# Patient Record
Sex: Female | Born: 1935 | Race: White | Hispanic: No | Smoking: Former smoker
Health system: Southern US, Community
[De-identification: ages and names within clinical notes are randomized; demographics above are authoritative.]

## PROBLEM LIST (undated history)

## (undated) DIAGNOSIS — C4490 Unspecified malignant neoplasm of skin, unspecified: Secondary | ICD-10-CM

## (undated) DIAGNOSIS — E785 Hyperlipidemia, unspecified: Secondary | ICD-10-CM

## (undated) DIAGNOSIS — I10 Essential (primary) hypertension: Secondary | ICD-10-CM

## (undated) DIAGNOSIS — M8580 Other specified disorders of bone density and structure, unspecified site: Secondary | ICD-10-CM

## (undated) DIAGNOSIS — K5730 Diverticulosis of large intestine without perforation or abscess without bleeding: Secondary | ICD-10-CM

## (undated) DIAGNOSIS — I7300 Raynaud's syndrome without gangrene: Secondary | ICD-10-CM

## (undated) DIAGNOSIS — W5581XA Bitten by other mammals, initial encounter: Secondary | ICD-10-CM

## (undated) DIAGNOSIS — M419 Scoliosis, unspecified: Secondary | ICD-10-CM

## (undated) DIAGNOSIS — S82899A Other fracture of unspecified lower leg, initial encounter for closed fracture: Secondary | ICD-10-CM

## (undated) DIAGNOSIS — E079 Disorder of thyroid, unspecified: Secondary | ICD-10-CM

## (undated) HISTORY — DX: Scoliosis, unspecified: M41.9

## (undated) HISTORY — DX: Essential (primary) hypertension: I10

## (undated) HISTORY — DX: Raynaud's syndrome without gangrene: I73.00

## (undated) HISTORY — PX: CATARACT EXTRACTION: SUR2

## (undated) HISTORY — DX: Hyperlipidemia, unspecified: E78.5

## (undated) HISTORY — PX: OTHER SURGICAL HISTORY: SHX169

## (undated) HISTORY — DX: Diverticulosis of large intestine without perforation or abscess without bleeding: K57.30

## (undated) HISTORY — DX: Unspecified malignant neoplasm of skin, unspecified: C44.90

## (undated) HISTORY — DX: Other fracture of unspecified lower leg, initial encounter for closed fracture: S82.899A

## (undated) HISTORY — DX: Disorder of thyroid, unspecified: E07.9

## (undated) HISTORY — DX: Other specified disorders of bone density and structure, unspecified site: M85.80

## (undated) HISTORY — PX: TONSILLECTOMY: SUR1361

## (undated) HISTORY — PX: DILATION AND CURETTAGE OF UTERUS: SHX78

## (undated) HISTORY — PX: HYSTEROSCOPY: SHX211

## (undated) HISTORY — DX: Bitten by other mammals, initial encounter: W55.81XA

---

## 1997-10-14 ENCOUNTER — Other Ambulatory Visit: Admission: RE | Admit: 1997-10-14 | Discharge: 1997-10-14 | Payer: Self-pay | Admitting: *Deleted

## 1998-02-24 ENCOUNTER — Other Ambulatory Visit: Admission: RE | Admit: 1998-02-24 | Discharge: 1998-02-24 | Payer: Self-pay | Admitting: *Deleted

## 1999-02-23 ENCOUNTER — Other Ambulatory Visit: Admission: RE | Admit: 1999-02-23 | Discharge: 1999-02-23 | Payer: Self-pay | Admitting: *Deleted

## 2000-03-07 ENCOUNTER — Other Ambulatory Visit: Admission: RE | Admit: 2000-03-07 | Discharge: 2000-03-07 | Payer: Self-pay | Admitting: *Deleted

## 2001-05-23 ENCOUNTER — Other Ambulatory Visit: Admission: RE | Admit: 2001-05-23 | Discharge: 2001-05-23 | Payer: Self-pay | Admitting: *Deleted

## 2002-09-06 HISTORY — PX: I & D EXTREMITY: SHX5045

## 2003-01-23 ENCOUNTER — Ambulatory Visit (HOSPITAL_BASED_OUTPATIENT_CLINIC_OR_DEPARTMENT_OTHER): Admission: RE | Admit: 2003-01-23 | Discharge: 2003-01-23 | Payer: Self-pay | Admitting: Orthopedic Surgery

## 2003-05-06 ENCOUNTER — Ambulatory Visit (HOSPITAL_COMMUNITY): Admission: RE | Admit: 2003-05-06 | Discharge: 2003-05-06 | Payer: Self-pay | Admitting: Obstetrics and Gynecology

## 2003-05-06 ENCOUNTER — Encounter (INDEPENDENT_AMBULATORY_CARE_PROVIDER_SITE_OTHER): Payer: Self-pay | Admitting: Specialist

## 2003-07-15 ENCOUNTER — Other Ambulatory Visit: Admission: RE | Admit: 2003-07-15 | Discharge: 2003-07-15 | Payer: Self-pay | Admitting: Obstetrics and Gynecology

## 2003-09-07 HISTORY — PX: COLONOSCOPY: SHX174

## 2004-04-27 ENCOUNTER — Encounter: Payer: Self-pay | Admitting: Internal Medicine

## 2005-04-27 ENCOUNTER — Other Ambulatory Visit: Admission: RE | Admit: 2005-04-27 | Discharge: 2005-04-27 | Payer: Self-pay | Admitting: Obstetrics and Gynecology

## 2005-05-03 ENCOUNTER — Ambulatory Visit: Payer: Self-pay | Admitting: Internal Medicine

## 2005-10-26 ENCOUNTER — Ambulatory Visit: Payer: Self-pay | Admitting: Internal Medicine

## 2005-10-29 ENCOUNTER — Ambulatory Visit: Payer: Self-pay | Admitting: Cardiology

## 2006-06-20 ENCOUNTER — Ambulatory Visit: Payer: Self-pay | Admitting: Internal Medicine

## 2006-07-15 ENCOUNTER — Ambulatory Visit: Payer: Self-pay | Admitting: Internal Medicine

## 2006-09-26 ENCOUNTER — Ambulatory Visit: Payer: Self-pay | Admitting: Internal Medicine

## 2006-09-26 LAB — CONVERTED CEMR LAB
ALT: 16 units/L (ref 0–40)
AST: 20 units/L (ref 0–37)
Cholesterol: 166 mg/dL (ref 0–200)

## 2006-12-02 ENCOUNTER — Ambulatory Visit: Payer: Self-pay | Admitting: Internal Medicine

## 2007-02-06 DIAGNOSIS — I7300 Raynaud's syndrome without gangrene: Secondary | ICD-10-CM | POA: Insufficient documentation

## 2007-02-06 DIAGNOSIS — E039 Hypothyroidism, unspecified: Secondary | ICD-10-CM | POA: Insufficient documentation

## 2007-02-14 ENCOUNTER — Ambulatory Visit: Payer: Self-pay | Admitting: Internal Medicine

## 2007-02-21 ENCOUNTER — Ambulatory Visit: Payer: Self-pay | Admitting: Internal Medicine

## 2007-02-24 ENCOUNTER — Encounter (INDEPENDENT_AMBULATORY_CARE_PROVIDER_SITE_OTHER): Payer: Self-pay | Admitting: *Deleted

## 2007-02-24 LAB — CONVERTED CEMR LAB
Cholesterol: 201 mg/dL (ref 0–200)
HDL: 65 mg/dL (ref >39.0)
Total CHOL/HDL Ratio: 3.1
Triglycerides: 79 mg/dL (ref 0–149)

## 2007-03-01 ENCOUNTER — Telehealth (INDEPENDENT_AMBULATORY_CARE_PROVIDER_SITE_OTHER): Payer: Self-pay | Admitting: *Deleted

## 2007-06-21 ENCOUNTER — Telehealth: Payer: Self-pay | Admitting: Internal Medicine

## 2007-07-25 ENCOUNTER — Encounter: Payer: Self-pay | Admitting: Internal Medicine

## 2007-08-22 ENCOUNTER — Telehealth (INDEPENDENT_AMBULATORY_CARE_PROVIDER_SITE_OTHER): Payer: Self-pay | Admitting: *Deleted

## 2007-09-04 ENCOUNTER — Ambulatory Visit: Payer: Self-pay | Admitting: Internal Medicine

## 2007-09-11 ENCOUNTER — Encounter (INDEPENDENT_AMBULATORY_CARE_PROVIDER_SITE_OTHER): Payer: Self-pay | Admitting: *Deleted

## 2008-02-14 ENCOUNTER — Ambulatory Visit: Payer: Self-pay | Admitting: Internal Medicine

## 2008-02-19 LAB — CONVERTED CEMR LAB
Cholesterol: 183 mg/dL (ref 0–200)
Creatinine, Ser: 0.9 mg/dL (ref 0.4–1.2)
HDL: 71.8 mg/dL (ref >39.0)
LDL Cholesterol: 96 mg/dL (ref 0–99)
TSH: 2.63 microintl units/mL (ref 0.35–5.50)
Total CHOL/HDL Ratio: 2.5
Triglycerides: 74 mg/dL (ref 0–149)
VLDL: 15 mg/dL (ref 0–40)

## 2008-02-20 ENCOUNTER — Ambulatory Visit: Payer: Self-pay | Admitting: Internal Medicine

## 2008-02-20 DIAGNOSIS — I10 Essential (primary) hypertension: Secondary | ICD-10-CM | POA: Insufficient documentation

## 2008-02-20 DIAGNOSIS — E785 Hyperlipidemia, unspecified: Secondary | ICD-10-CM | POA: Insufficient documentation

## 2008-02-20 LAB — CONVERTED CEMR LAB
Cholesterol, target level: 200 mg/dL
HDL goal, serum: 50 mg/dL
LDL Goal: 110 mg/dL

## 2008-03-27 ENCOUNTER — Encounter: Payer: Self-pay | Admitting: Internal Medicine

## 2008-09-13 ENCOUNTER — Encounter: Payer: Self-pay | Admitting: Internal Medicine

## 2009-02-20 ENCOUNTER — Ambulatory Visit: Payer: Self-pay | Admitting: Internal Medicine

## 2009-02-20 LAB — CONVERTED CEMR LAB
ALT: 15 units/L (ref 0–35)
BUN: 19 mg/dL (ref 6–23)
Cholesterol: 171 mg/dL (ref 0–200)
Creatinine, Ser: 0.8 mg/dL (ref 0.4–1.2)
LDL Cholesterol: 75 mg/dL (ref 0–99)
TSH: 1.77 microintl units/mL (ref 0.35–5.50)

## 2009-02-24 ENCOUNTER — Ambulatory Visit: Payer: Self-pay | Admitting: Internal Medicine

## 2009-02-24 DIAGNOSIS — E875 Hyperkalemia: Secondary | ICD-10-CM | POA: Insufficient documentation

## 2009-02-24 DIAGNOSIS — K5730 Diverticulosis of large intestine without perforation or abscess without bleeding: Secondary | ICD-10-CM | POA: Insufficient documentation

## 2009-03-05 ENCOUNTER — Encounter (INDEPENDENT_AMBULATORY_CARE_PROVIDER_SITE_OTHER): Payer: Self-pay | Admitting: *Deleted

## 2009-09-06 HISTORY — PX: MOHS SURGERY: SUR867

## 2010-01-19 ENCOUNTER — Encounter: Payer: Self-pay | Admitting: Internal Medicine

## 2010-02-03 ENCOUNTER — Encounter: Payer: Self-pay | Admitting: Internal Medicine

## 2010-02-27 ENCOUNTER — Telehealth (INDEPENDENT_AMBULATORY_CARE_PROVIDER_SITE_OTHER): Payer: Self-pay | Admitting: *Deleted

## 2010-05-21 ENCOUNTER — Telehealth (INDEPENDENT_AMBULATORY_CARE_PROVIDER_SITE_OTHER): Payer: Self-pay | Admitting: *Deleted

## 2010-05-29 ENCOUNTER — Ambulatory Visit: Payer: Self-pay | Admitting: Internal Medicine

## 2010-05-29 LAB — CONVERTED CEMR LAB
Albumin: 4.1 g/dL (ref 3.5–5.2)
Alkaline Phosphatase: 38 units/L — ABNORMAL LOW (ref 39–117)
Basophils Relative: 1 % (ref 0.0–3.0)
CO2: 29 meq/L (ref 19–32)
Chloride: 103 meq/L (ref 96–112)
Eosinophils Relative: 3.9 % (ref 0.0–5.0)
HCT: 38.8 % (ref 36.0–46.0)
HDL: 62.2 mg/dL (ref >39.00)
LDL Cholesterol: 94 mg/dL (ref 0–99)
Lymphs Abs: 1.3 10*3/uL (ref 0.7–4.0)
MCV: 95.4 fL (ref 78.0–100.0)
Monocytes Absolute: 0.4 10*3/uL (ref 0.1–1.0)
Neutro Abs: 1.8 10*3/uL (ref 1.4–7.7)
RBC: 4.06 M/uL (ref 3.87–5.11)
Sodium: 139 meq/L (ref 135–145)
Total Bilirubin: 0.4 mg/dL (ref 0.3–1.2)
Total CHOL/HDL Ratio: 3
Triglycerides: 127 mg/dL (ref 0.0–149.0)
WBC: 3.7 10*3/uL — ABNORMAL LOW (ref 4.5–10.5)

## 2010-06-05 ENCOUNTER — Encounter: Payer: Self-pay | Admitting: Internal Medicine

## 2010-06-05 ENCOUNTER — Ambulatory Visit: Payer: Self-pay | Admitting: Internal Medicine

## 2010-06-05 DIAGNOSIS — R9431 Abnormal electrocardiogram [ECG] [EKG]: Secondary | ICD-10-CM | POA: Insufficient documentation

## 2010-06-05 DIAGNOSIS — M8580 Other specified disorders of bone density and structure, unspecified site: Secondary | ICD-10-CM | POA: Insufficient documentation

## 2010-06-05 DIAGNOSIS — Z85828 Personal history of other malignant neoplasm of skin: Secondary | ICD-10-CM | POA: Insufficient documentation

## 2010-09-06 DIAGNOSIS — S82899A Other fracture of unspecified lower leg, initial encounter for closed fracture: Secondary | ICD-10-CM

## 2010-09-06 HISTORY — DX: Other fracture of unspecified lower leg, initial encounter for closed fracture: S82.899A

## 2010-09-24 ENCOUNTER — Encounter: Payer: Self-pay | Admitting: Internal Medicine

## 2010-10-04 LAB — CONVERTED CEMR LAB
ALT: 16 units/L (ref 0–40)
Cholesterol: 204 mg/dL (ref 0–200)
Creatinine, Ser: 1 mg/dL (ref 0.4–1.2)
Glucose, Bld: 87 mg/dL (ref 70–99)
HDL: 73.3 mg/dL (ref >39.0)
LDL DIRECT: 125.2 mg/dL
TSH: 2.2 microintl units/mL (ref 0.35–5.50)
Triglyceride fasting, serum: 81 mg/dL (ref 0–149)

## 2010-10-08 ENCOUNTER — Telehealth (INDEPENDENT_AMBULATORY_CARE_PROVIDER_SITE_OTHER): Payer: Self-pay | Admitting: *Deleted

## 2010-10-08 NOTE — Letter (Signed)
Summary: Cancer Screening/[facility] Personalized Risk Profile  Cancer Screening/[facility] Personalized Risk Profile   Imported By: Lanelle Bal 06/12/2010 10:08:32  _____________________________________________________________________  External Attachment:    Type:   Image     Comment:   External Document

## 2010-10-08 NOTE — Assessment & Plan Note (Signed)
Summary: cpx//kn   Vital Signs:  Patient profile:   75 year old female Height:      67 inches Weight:      152.8 pounds BMI:     24.02 Temp:     98.2 degrees F oral Pulse rate:   64 / minute Resp:     14 per minute BP sitting:   124 / 80  (left arm) Cuff size:   large  Vitals Entered By: Shonna Chock CMA (June 05, 2010 9:39 AM) CC: CPX with fasting , Lipid Management   CC:  CPX with fasting  and Lipid Management.  History of Present Illness: Here for Medicare AWV: 1.Risk factors based on Past M, S, F history:HTN;Dyslipidemia;Hypothyroidism( chart updated) 2.Physical Activities: 10,000 steps  daily  3.Depression/mood: : no issues 4.Hearing: whisper heard @ 6 ft 5.ADL's: no limitations 6.Fall Risk: no issues; home safety proofed 7.Home Safety: no issues 8.Height, weight, &visual acuity:wall chart read @ 7ft w/o lenses 9.Counseling: none requested; POA & Living Will not  in place 10.Labs ordered based on risk factors: results reviewed & risks discussed 11.Referral Coordination: none requested 12.Care Plan: see Instructions 13. Cognitive Assessment: Oreinted X 3 ; memory & recall  intact  ; "WORLD" spelled backwards ; mood & affect normal. Hyperlipidemia Follow-Up      This is a 75 year old woman who presents for Hyperlipidemia follow-up.  The patient denies muscle aches, GI upset, abdominal pain, flushing, itching, constipation, diarrhea, and fatigue.  The patient denies the following symptoms: chest pain/pressure, exercise intolerance, dypsnea, palpitations, syncope, and pedal edema.  Compliance with medications (by patient report) has been near 100%.  Dietary compliance has been good.  Adjunctive measures currently used by the patient include folic acid, niacin, and fish oil supplements.   Hypertension Follow-Up      The patient also presents for Hypertension follow-up.  The patient denies lightheadedness, urinary frequency, and headaches.  Compliance with medications (by  patient report) has been near 100%.  Adjunctive measures currently used by the patient include salt restriction.  BP 130/80 on average @ home..  Lipid Management History:      Positive NCEP/ATP III risk factors include female age 74 years old or older and hypertension.  Negative NCEP/ATP III risk factors include no history of early menopause without estrogen hormone replacement, non-diabetic, HDL cholesterol greater than 60, no family history for ischemic heart disease, non-tobacco-user status, no ASHD (atherosclerotic heart disease), no prior stroke/TIA, no peripheral vascular disease, and no history of aortic aneurysm.     Preventive Screening-Counseling & Management  Alcohol-Tobacco     Alcohol drinks/day: 3 glasses wine     Smoking Status: quit > 6 months     Packs/Day: 1pp week     Year Quit: 1982  Caffeine-Diet-Exercise     Caffeine use/day: 1 cup / day     Diet Comments:    no specific diet, 'heart healthy'  Hep-HIV-STD-Contraception     Dental Visit-last 6 months yes     Sun Exposure-Excessive: no  Safety-Violence-Falls     Seat Belt Use: yes     Firearms in the Home: no firearms in the home     Smoke Detectors: yes      Blood Transfusions:  no.        Travel History:  remotely only.    Allergies: 1)  ! Cephalexin 2)  ! Macrodantin 3)  ! Pcn  Past History:  Past Medical History: Hypothyroidism Hyperlipidemia: NMR Lipoprofile 2005: LDL [id number]),  HDL 71, TG 98. LDL goal = < 120 (Note: Framingham Study LDL goal = < 160) Hypertension Scoliosis Diverticulosis, colon Osteopenia Skin cancer, hx of, Dr Londell Moh  Past Surgical History: Uterine polyp; Dr Richardean Chimera, Gyn Colonoscopy : [facility] 2005 Tonsillectomy; G4  P3; Mohs surgery L forehead  Family History: Father: dementia Mother: stomach cancer Siblings: bro: leukemia;sister: sudden death @ 2( BCP, smoker), Rheumatic Fever  Social History: Smoking Status:  quit > 6 months Packs/Day:  1pp week Caffeine  use/day:  1 cup / day Dental Care w/in 6 mos.:  yes Sun Exposure-Excessive:  no Seat Belt Use:  yes Blood Transfusions:  no  Review of Systems  The patient denies anorexia, fever, weight loss, weight gain, vision loss, decreased hearing, hoarseness, melena, hematochezia, severe indigestion/heartburn, hematuria, suspicious skin lesions, unusual weight change, abnormal bleeding, enlarged lymph nodes, and angioedema.    Physical Exam  General:  Appears younger than age,well-nourished; alert,appropriate and cooperative throughout examination Head:  Normocephalic and atraumatic without obvious abnormalities. Eyes:  No corneal or conjunctival inflammation noted Perrla. Funduscopic exam benign, without hemorrhages, exudates or papilledema.  Ears:  External ear exam shows no significant lesions or deformities.  Otoscopic examination reveals clear canals, tympanic membranes are intact bilaterally without bulging, retraction, inflammation or discharge. Hearing is grossly normal bilaterally. Nose:  External nasal examination shows no deformity or inflammation. Nasal mucosa are pink and moist without lesions or exudates. Mouth:  Oral mucosa and oropharynx without lesions or exudates.  Teeth in  excellent repair. Neck:  No deformities, masses, or tenderness noted. R lobe > L w/o nodules  Lungs:  Normal respiratory effort, chest expands symmetrically. Lungs are clear to auscultation, no crackles or wheezes. Heart:  regular rhythm, no murmur, no gallop, no rub, no JVD, no HJR, and bradycardia. S4 . Grade 1 R base murmur Abdomen:  Bowel sounds positive,abdomen soft and non-tender without masses, organomegaly or hernias noted. Genitalia:  Dr Arelia Sneddon Msk:  No deformity or scoliosis noted of thoracic or lumbar spine on observation.   Pulses:  R and L carotid,radial,dorsalis pedis and posterior tibial pulses are full and equal bilaterally Extremities:  No clubbing, cyanosis, edema, or deformity noted with normal  full range of motion of all joints.   Neurologic:  alert & oriented X3 and DTRs symmetrical and normal.   Skin:  Intact without suspicious lesions or rashes Cervical Nodes:  No lymphadenopathy noted Axillary Nodes:  No palpable lymphadenopathy Psych:  memory intact for recent and remote, normally interactive, and good eye contact.     Impression & Recommendations:  Problem # 1:  PREVENTIVE HEALTH CARE (ICD-V70.0)  Orders: Medicare -1st Annual Wellness Visit [phone])  Problem # 2:  HYPERTENSION (ICD-401.9)  controlled .Her updated medication list for this problem includes:    Lisinopril 10 Mg Tabs (Lisinopril) .Marland Kitchen... Take 1 tablet by mouth once a day  Orders: EKG w/ Interpretation (93000)  Problem # 3:  HYPERLIPIDEMIA (ICD-272.4)  Her updated medication list for this problem includes:    Lipitor 20 Mg Tabs (Atorvastatin calcium) .Marland Kitchen... Daily as directed  Problem # 4:  HYPOTHYROIDISM (ICD-244.9)  Her updated medication list for this problem includes:    Synthroid 88 Mcg Tabs (Levothyroxine sodium) .Marland Kitchen... 1 by mouth once daily except 1/2 t,th,sat  Problem # 5:  NONSPECIFIC ABNORMAL ELECTROCARDIOGRAM (ICD-794.31)  IMPROVED vs 07/15/2006  Orders: EKG w/ Interpretation (93000)  Complete Medication List: 1)  Synthroid 88 Mcg Tabs (Levothyroxine sodium) .Marland Kitchen.. 1 by mouth once daily except 1/2  t,th,sat 2)  Viactiv Chew (Calcium-vitamin d-vitamin k chew) 3)  Daily Combo Multivits/calcium Tabs (Multiple vitamins-calcium) 4)  Metamucil Powd (Psyllium powd) 5)  Lisinopril 10 Mg Tabs (Lisinopril) .... Take 1 tablet by mouth once a day 6)  Lipitor 20 Mg Tabs (Atorvastatin calcium) .... Daily as directed 7)  Restasis 0.05 % Emul (Cyclosporine) .Marland Kitchen.. 1 drpo in each eye two times a day 8)  Biotin 250mg   .... 1 by mouth once daily 9)  Vitamin D (ergocalciferol) 50000 Unit Caps (Ergocalciferol) .... Take one tablet weekly. 10)  Fish Oil 1200 Mg Caps (Omega-3 fatty acids) .Marland Kitchen.. 1 by mouth two  times a day  Other Orders: Flu Vaccine 73yrs + MEDICARE PATIENTS (Z6010) Administration Flu vaccine - MCR (G0008)  Lipid Assessment/Plan:      Based on NCEP/ATP III, the patient's risk factor category is "0-1 risk factors".  The patient's lipid goals are as follows: Total cholesterol goal is 200; LDL cholesterol goal is 120; HDL cholesterol goal is 50; Triglyceride goal is 150.  Her LDL cholesterol goal has been met.   Flu Vaccine Consent Questions     Do you have a history of severe allergic reactions to this vaccine? no    Any prior history of allergic reactions to egg and/or gelatin? no    Do you have a sensitivity to the preservative Thimersol? no    Do you have a past history of Guillan-Barre Syndrome? no    Do you currently have an acute febrile illness? no    Have you ever had a severe reaction to latex? no    Vaccine information given and explained to patient? yes    Are you currently pregnant? no    Lot Number:[id number]   Exp Date:03/06/2011   Site Given  Right Deltoid IMers: Flu Vaccine 76yrs + MEDICARE PATIENTS (X3235) Administration Flu vaccine - MCR (T7322)  Patient Instructions: 1)  No change in meds or exercise program. Consider Living Will & POA. Please complete stool cards.Continue present exercise program. Prescriptions: LIPITOR 20 MG  TABS (ATORVASTATIN CALCIUM) daily as directed  #90 x 1   Entered and Authorized by:   Marga Melnick MD   Signed by:   Marga Melnick MD on 06/05/2010   Method used:   Print then Give to Patient   RxID:   [id number] LISINOPRIL 10 MG TABS (LISINOPRIL) Take 1 tablet by mouth once a day  #90 x 3   Entered and Authorized by:   Marga Melnick MD   Signed by:   Marga Melnick MD on 06/05/2010   Method used:   Print then Give to Patient   RxID:   [id number] SYNTHROID 88 MCG TABS (LEVOTHYROXINE SODIUM) 1 by mouth once daily except 1/2 T,Th,sat  #90 x 3   Entered and Authorized by:   Marga Melnick MD   Signed by:   Marga Melnick MD on 06/05/2010   Method used:   Print then Give to Patient   RxID:   [id number]  .[email or web address]

## 2010-10-08 NOTE — Progress Notes (Signed)
Summary: REFILL REQUEST  Phone Note Refill Request Call back at [phone] Message from:  Pharmacy on February 27, 2010 10:51 AM  Refills Requested: Medication #1:  LISINOPRIL 10 MG TABS Take 1 tablet by mouth once a day   Dosage confirmed as above?Dosage Confirmed   Supply Requested: 3 months   Last Refilled: 09/16/[facility] [address]  Next Appointment Scheduled: SEPT 13TH 2011 Initial call taken by: Lavell Islam,  February 27, 2010 10:52 AM    Prescriptions: LISINOPRIL 10 MG TABS (LISINOPRIL) Take 1 tablet by mouth once a day  #90 x 3   Entered by:   Shonna Chock   Authorized by:   Marga Melnick MD   Signed by:   Shonna Chock on 02/27/2010   Method used:   Electronically to        [facility]ail[facility]       [address]       [place], [place]  [address]       Ph: [phone]       Fax: [phone]   RxID:   [id number]

## 2010-10-08 NOTE — Progress Notes (Signed)
Summary: NEED LAB ORDERS FOR 9/23  Phone Note Call from Patient Call back at [facility] Phone [phone]   Caller: Patient Summary of Call: PATIENT WILL SEE DR HOPPER ON 9/30---I HAVE ENTERED LABS FOR HER ON 9/23 AND ADVISED HER TO CALL  HER [employer] TO SEE IF LABS BEFORE YEARLY EXAM WILL BE OK  WHAT LABS SHOULD I ENTER?? Initial call taken by: Jerolyn Shin,  May 21, 2010 3:05 PM  Follow-up for Phone Call        Lipid,Hep,BMP,TSH,CBCD,PSA, Stool Cards, Udip 272.4/995.20/401.9/244.9/v70.0 Follow-up by: Shonna Chock CMA,  May 21, 2010 4:37 PM  Additional Follow-up for Phone Call Additional follow up Details #1::        ENTERED LAB INFO Additional Follow-up by: Jerolyn Shin,  May 25, 2010 4:18 PM

## 2010-10-14 NOTE — Progress Notes (Signed)
Summary: Opinion on Treatment  Phone Note Call from Patient Call back at Home Phone [phone] Call back at Work Phone [phone] Call back at today: [phone]   Caller: Patient Summary of Call: Patient called this afternoon satating that she had been having ankle pain with no fall or injury to the ankle for about 7-8 week and went to [facility] and they ran tests and find a small break and fracture in her ankle and placed her in a boot this morning for at least a month. They discussed her being on a calcium supplement and vit D level. She is taking an OTC calcium supplement (Viactive) and has 500mg  of calcium in her MV she takes and also 1000u of vitamin D in the mornigs as well. She would like to know if there is anything else she should be doing or change. Please advise.  Initial call taken by: Harold Barban,  October 08, 2010 3:33 PM  Follow-up for Phone Call        Calcium 600 mg two times a day ; annual vitamin D level & BMD every 25 months. Once Ortho allows, walk 30-45 min 3-4x/ week. Follow-up by: Marga Melnick MD,  October 08, 2010 3:37 PM  Additional Follow-up for Phone Call Additional follow up Details #1::        Patient is aware of information.  Additional Follow-up by: Harold Barban,  October 08, 2010 3:41 PM

## 2010-10-16 ENCOUNTER — Encounter: Payer: Self-pay | Admitting: Internal Medicine

## 2010-10-16 ENCOUNTER — Ambulatory Visit (INDEPENDENT_AMBULATORY_CARE_PROVIDER_SITE_OTHER): Payer: Medicare Other | Admitting: Internal Medicine

## 2010-10-16 DIAGNOSIS — J209 Acute bronchitis, unspecified: Secondary | ICD-10-CM

## 2010-10-16 DIAGNOSIS — R509 Fever, unspecified: Secondary | ICD-10-CM

## 2010-10-16 DIAGNOSIS — J029 Acute pharyngitis, unspecified: Secondary | ICD-10-CM

## 2010-10-19 ENCOUNTER — Telehealth (INDEPENDENT_AMBULATORY_CARE_PROVIDER_SITE_OTHER): Payer: Self-pay | Admitting: *Deleted

## 2010-10-22 NOTE — Letter (Signed)
Summary: [facility]   Imported By: Lanelle Bal 10/12/2010 13:56:46  _____________________________________________________________________  External Attachment:    Type:   Image     Comment:   External Document

## 2010-10-22 NOTE — Assessment & Plan Note (Signed)
Summary: sore throat/cbs   Vital Signs:  Patient profile:   75 year old female Weight:      158 pounds BMI:     24.84 Temp:     99.3 degrees F oral Pulse rate:   60 / minute Resp:     13 per minute BP sitting:   118 / 72  (left arm) Cuff size:   large  Vitals Entered By: Shonna Chock CMA (October 16, 2010 11:13 AM) CC: Cough, sore throat, fever, and swollen glands x 3 days, URI symptoms   CC:  Cough, sore throat, fever, and swollen glands x 3 days, and URI symptoms.  History of Present Illness:    Onset as ST 10/14/2010 followed by cough with yellow green as of today.She now  reports  progression of sore throat, but denies purulent nasal discharge and earache.  Associated symptoms include low-grade fever (<100.8 degrees).  The patient denies dyspnea and wheezing.  The patient denies headache , bilateral facial pain, or  tooth pain. She has had  tender adenopathy.    Allergies: 1)  ! Cephalexin 2)  ! Macrodantin 3)  ! Pcn  Physical Exam  General:  Appears tired but in no acute distress; alert,appropriate and cooperative throughout examination Eyes:  No corneal or conjunctival inflammation noted. EOMI.  Mild scleral redness ; no exudate. Vision grossly normal. Ears:  External ear exam shows no significant lesions or deformities.  Otoscopic examination reveals clear canals, tympanic membranes are intact bilaterally without bulging, retraction, inflammation or discharge. Hearing is grossly normal bilaterally. Nose:  External nasal examination shows no deformity or inflammation. Nasal mucosa are  dry  without lesions or exudates. Hyponasal speech Mouth:  Oral mucosa and oropharynx without lesions or exudates.  Teeth in good repair. Mild pharyngeal erythema.   Lungs:  Normal respiratory effort, chest expands symmetrically. Lungs are clear to auscultation, no crackles or wheezes. Cervical Nodes:  No lymphadenopathy noted Axillary Nodes:  No palpable lymphadenopathy   Impression &  Recommendations:  Problem # 1:  PHARYNGITIS-ACUTE (ICD-462)  Her updated medication list for this problem includes:    Azithromycin 250 Mg Tabs (Azithromycin) .Marland Kitchen... As  per  pack  Orders: Prescription Created Electronically [phone])  Problem # 2:  BRONCHITIS-ACUTE (ICD-466.0)  Her updated medication list for this problem includes:    Azithromycin 250 Mg Tabs (Azithromycin) .Marland Kitchen... As  per  pack  Orders: Prescription Created Electronically [phone])  Complete Medication List: 1)  Synthroid 88 Mcg Tabs (Levothyroxine sodium) .Marland Kitchen.. 1 by mouth once daily except 1/2 t,th,sat 2)  Viactiv Chew (Calcium-vitamin d-vitamin k chew) 3)  Daily Combo Multivits/calcium Tabs (Multiple vitamins-calcium) 4)  Metamucil Powd (Psyllium powd) 5)  Lisinopril 10 Mg Tabs (Lisinopril) .... Take 1 tablet by mouth once a day 6)  Lipitor 20 Mg Tabs (Atorvastatin calcium) .... Daily as directed 7)  Restasis 0.05 % Emul (Cyclosporine) .Marland Kitchen.. 1 drpo in each eye two times a day 8)  Biotin 250mg   .... 1 by mouth once daily 9)  Fish Oil 1200 Mg Caps (Omega-3 fatty acids) .Marland Kitchen.. 1 by mouth two times a day 10)  Azithromycin 250 Mg Tabs (Azithromycin) .... As  per  pack  Other Orders: Rapid Strep (29528)  Patient Instructions: 1)  Zicam Melts as needed for sore throat as needed . 2)  Drink as much NON dairy  fluid as you can tolerate for the next few days. Prescriptions: AZITHROMYCIN 250 MG TABS (AZITHROMYCIN) as  per  pack  #1 x 0  Entered and Authorized by:   Marga Melnick MD   Signed by:   Marga Melnick MD on 10/16/2010   Method used:   Electronically to        [facility]       [address]       [place], [place]  [address]       Ph: [phone]       Fax: [phone]   RxID:   [id number]    Orders Added: 1)  Rapid Strep [56387] 2)  Est. Patient Level III [56433] 3)  Prescription Created Electronically [phone]

## 2010-10-28 NOTE — Progress Notes (Signed)
Summary: reaction to med  Phone Note Call from Patient Call back at Home Phone [phone]   Caller: Patient Summary of Call: Pt called and states she took Azithroymcin and had a reaction to it over the weekend. She was dizzy, unable to eat, heart raching, tongue felt numb. She called pharmacy and they instructed her to stop the ATB and take a Benadryl. All of her symptoms are gone. She only took 4 doses of the ATB. The pharmacy gave her dyson for her cough. She would like to know if she needs another ATB or a stronger cough medicine. [facility] uses [facility] [address].  Initial call taken by: Army Fossa CMA,  October 19, 2010 4:20 PM  Follow-up for Phone Call        Codeine & Tessalon cross react with Keflex to which she is allergic ; use Mucinex DM Follow-up by: Marga Melnick MD,  October 19, 2010 4:32 PM  Additional Follow-up for Phone Call Additional follow up Details #1::        Patient aware of Dr.Hopper's response Additional Follow-up by: Shonna Chock CMA,  October 19, 2010 4:35 PM

## 2011-01-12 ENCOUNTER — Other Ambulatory Visit (HOSPITAL_COMMUNITY): Payer: Self-pay | Admitting: Obstetrics and Gynecology

## 2011-01-12 DIAGNOSIS — E041 Nontoxic single thyroid nodule: Secondary | ICD-10-CM

## 2011-01-14 ENCOUNTER — Ambulatory Visit (HOSPITAL_COMMUNITY)
Admission: RE | Admit: 2011-01-14 | Discharge: 2011-01-14 | Disposition: A | Discharge status: Home / Self Care | Payer: Medicare Other | Source: Ambulatory Visit | Attending: Obstetrics and Gynecology | Admitting: Obstetrics and Gynecology

## 2011-01-14 DIAGNOSIS — E041 Nontoxic single thyroid nodule: Secondary | ICD-10-CM | POA: Insufficient documentation

## 2011-01-14 DIAGNOSIS — Z880 Allergy status to penicillin: Secondary | ICD-10-CM | POA: Insufficient documentation

## 2011-01-14 IMAGING — US US SOFT TISSUE HEAD/NECK
1 series · 14 of 25 positions shown · non-contrast
Comparison: None available

CLINICAL DATA: Thyroid nodule

THYROID ULTRASOUND
TECHNIQUE: Ultrasound examination of the thyroid gland and adjacent
soft tissues was performed.

[Series 1: us soft tissue head/neck · 0.08mm/px · 14 of 28 slices shown]
[im 1/28]
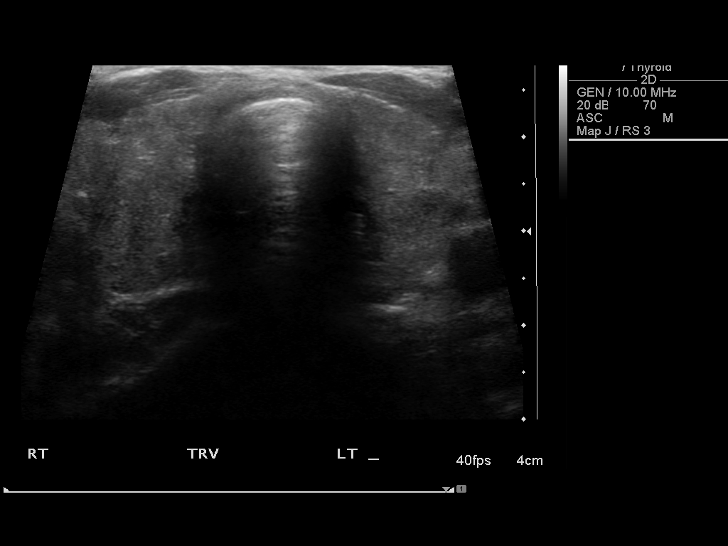
[im 3/28]
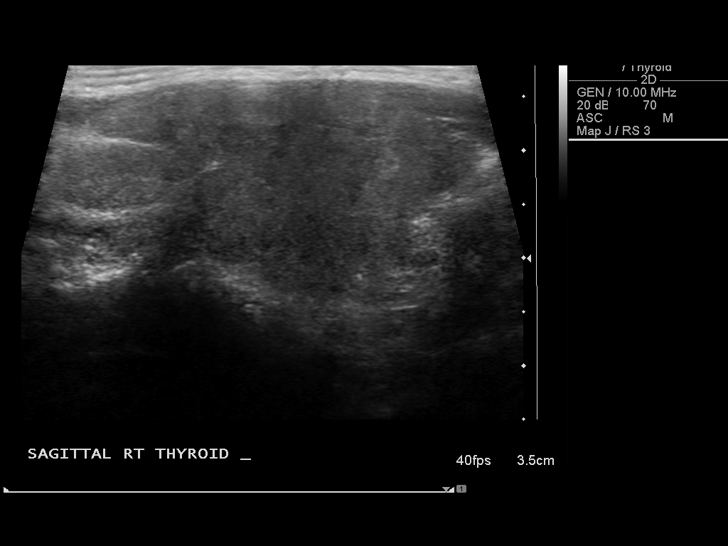
[im 5/28]
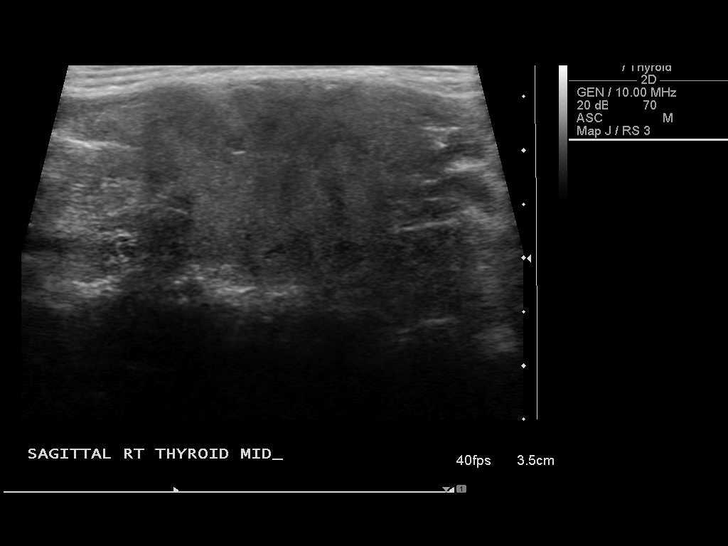
[im 7/28]
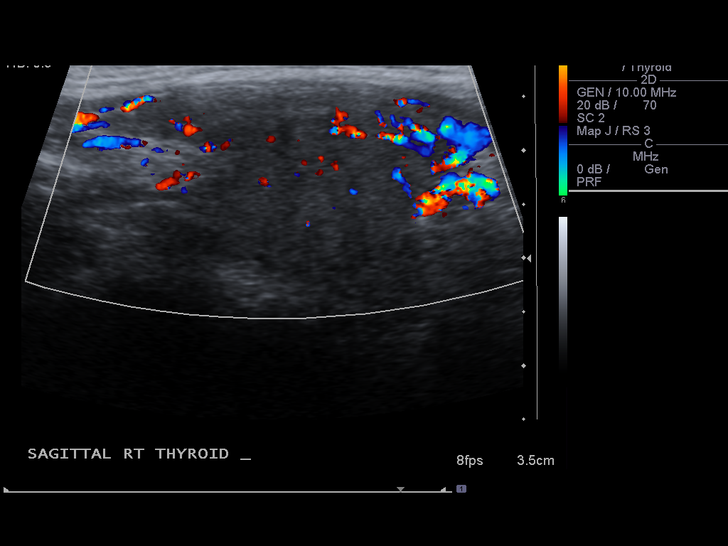
[im 10/28]
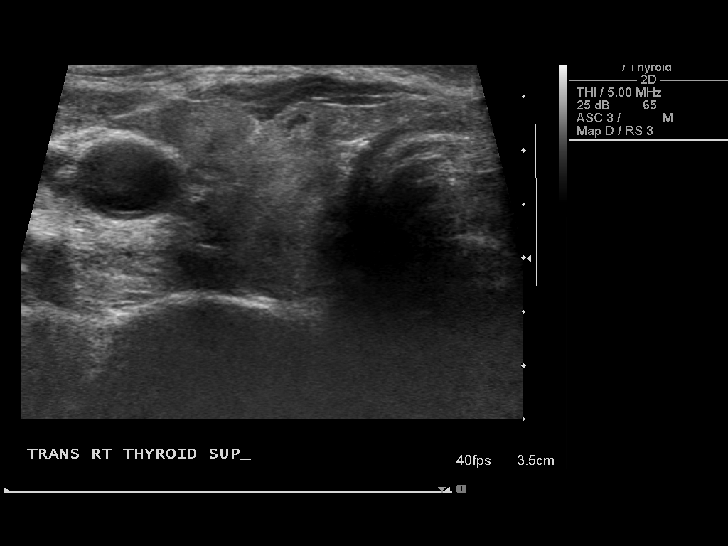
[im 11/28]
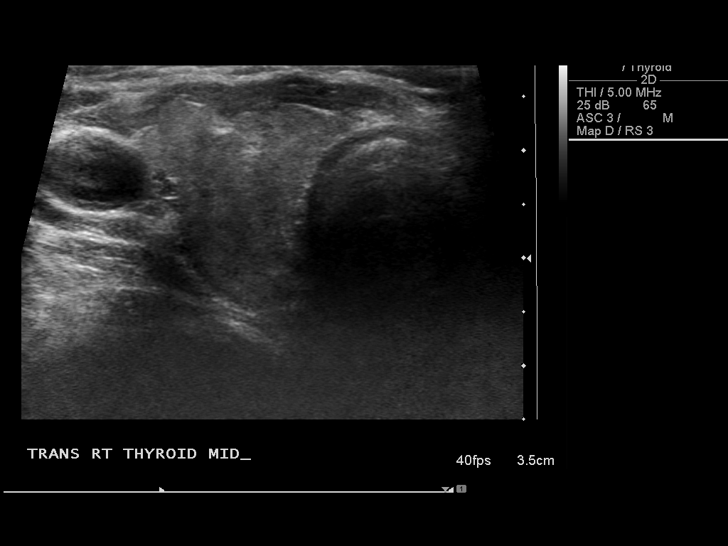
[im 13/28]
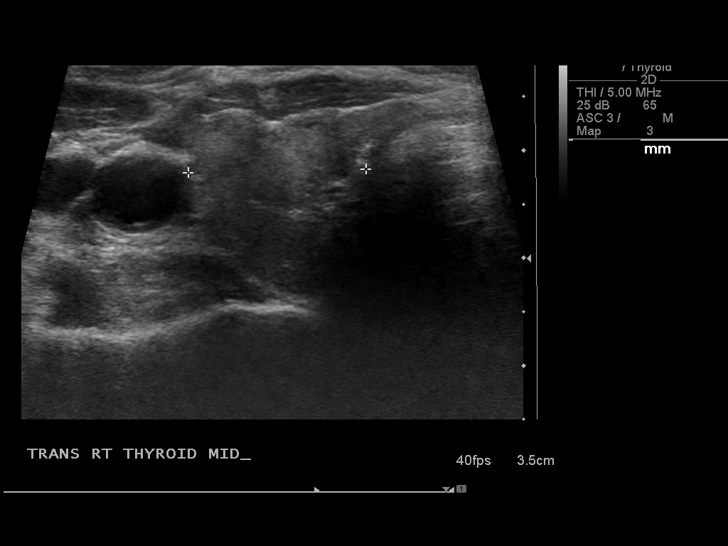
[im 15/28]
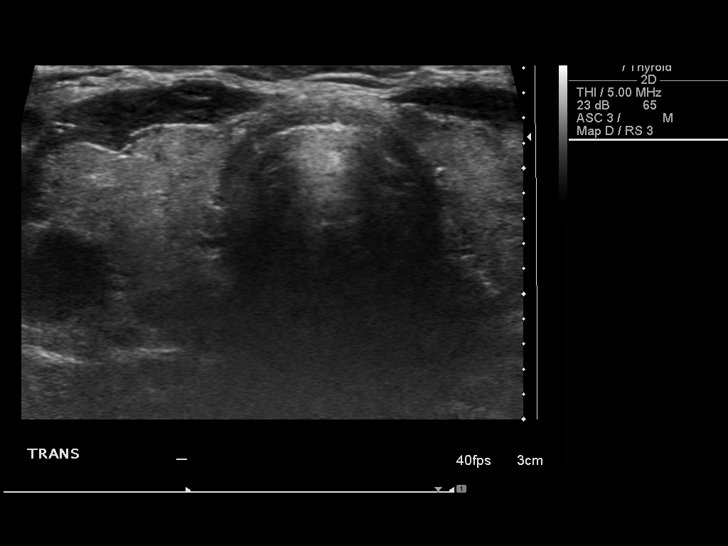
[im 17/28]
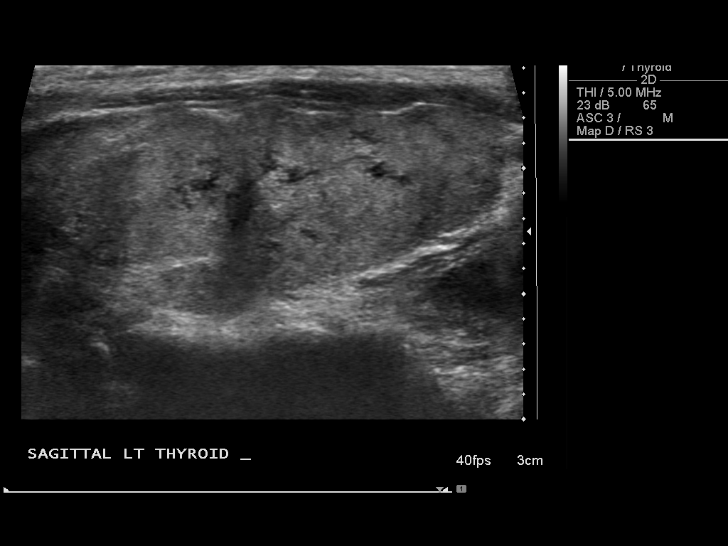
[im 19/28]
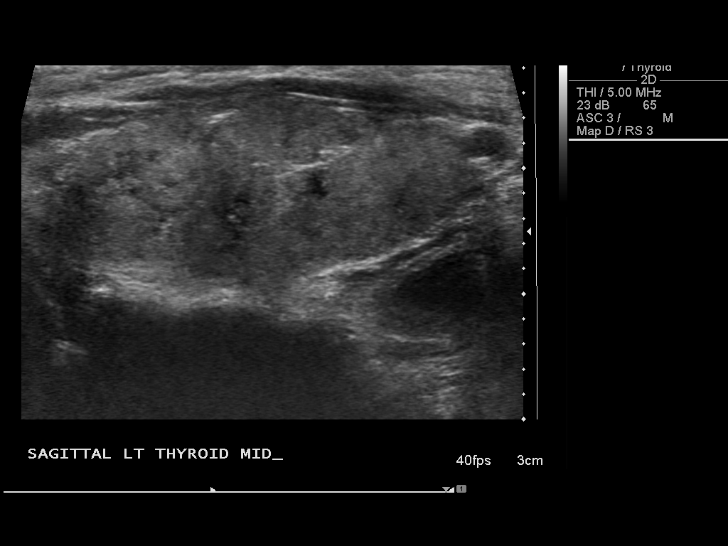
[im 21/28]
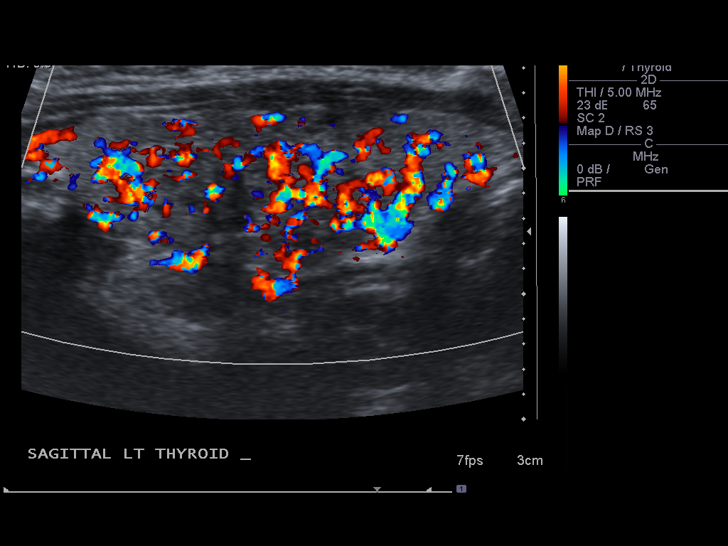
[im 23/28]
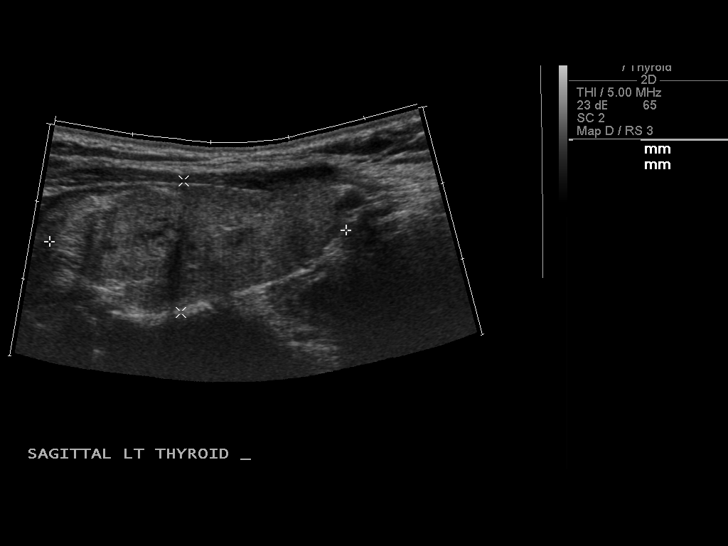
[im 25/28]
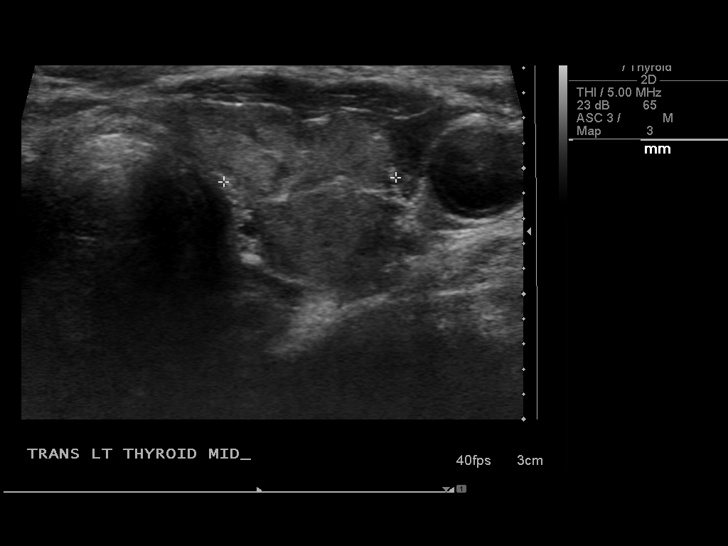
[im 28/28]
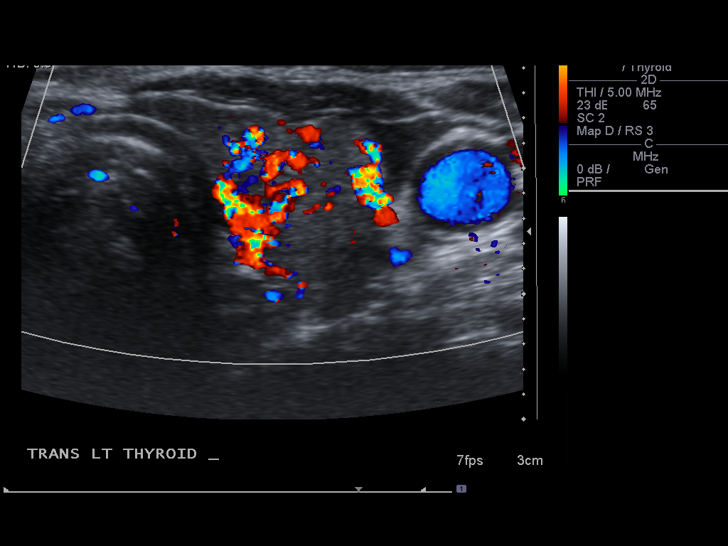

[14 of 25 positions shown; findings below may reference images not displayed]

FINDINGS: Right thyroid lobe:  17 x 20 x 57 mm, inhomogeneous and hyperemic
without focal lesion
Left thyroid lobe:  14 x 17 x 38 mm, inhomogeneous and hyperemic
without discrete lesion
Isthmus:  3 mm in thickness

Focal nodules:  None

Lymphadenopathy:  None visualized.
IMPRESSION: 1.  No discrete thyroid nodule or mass.
2.  Diffuse hyperemia suggesting thyroiditis.

## 2011-01-22 NOTE — H&P (Signed)
NAME:  Tara Bridges, Tara Bridges                          ACCOUNT NO.:  [id number]   MEDICAL RECORD NO.:  [id number]                   PATIENT TYPE:  AMB   LOCATION:  [facility]                                  FACILITY:  [facility]   PHYSICIAN:  Juluis Mire, M.D.                DATE OF BIRTH:  1936/08/30   DATE OF ADMISSION:  05/06/2003  DATE OF DISCHARGE:                                HISTORY & PHYSICAL   HISTORY OF PRESENT ILLNESS:  The patient is a 75 year old gravida 4, para 3,  abortus 1 postmenopausal white female who presents for a hysteroscopy with  D&C.   In relation to the present admission patient was initially seen in our  practice on June 10.  At that point in time she had been on exogenous  hormone replacement therapy in the form of Premarin 0.625 mg day 1-25 and  medroxyprogesterone acetate 5 mg daily 16-25.  She was having abnormal  bleeding throughout the month.  Because of this she underwent a saline  infusion ultrasound in the office which revealed a definitive endometrial  polyp with slight endometrial thickness.  In view of this she now presents  for hysteroscopic evaluation.  Of note, she has weaned off hormones at the  present time.   ALLERGIES:  MACRODANTIN and CEPHALEXIN.   CURRENT MEDICATIONS:  Synthroid 88 mcg daily.   PAST MEDICAL HISTORY:  Usual childhood diseases.  No significant sequelae.  She does have hypothyroidism for which she is on active management.   PAST SURGICAL HISTORY:  None.   PAST OBSTETRICAL HISTORY:  She has had three spontaneous vaginal deliveries.   FAMILY HISTORY:  Mother with a history of stomach carcinoma.  Brother with a  history of leukemia.  Father has a history of emphysema.   SOCIAL HISTORY:  No tobacco or alcohol use.   REVIEW OF SYSTEMS:  Noncontributory.   PHYSICAL EXAMINATION:  GENERAL:  The patient is afebrile with stable vital  signs.  HEENT:  Normocephalic.  Pupils are equal, round, and reactive to light and  accommodation.   Extraocular movements are intact.  Sclerae and conjunctivae  clear.  Oropharynx clear.  NECK:  Without thyromegaly.  BREASTS:  Not examined.  LUNGS:  Clear.  CARDIOVASCULAR:  Regular rate.  No murmurs or gallops.  ABDOMEN:  Benign.  No mass, organomegaly, or tenderness.  PELVIC:  Normal external genitalia.  Vaginal mucosa clear.  Cervix  unremarkable.  Uterus normal size, shape, and contour.  Adnexa free of  masses or tenderness.  EXTREMITIES:  Trace edema.  NEUROLOGIC:  Grossly within normal limits.   IMPRESSION:  Postmenopausal bleeding with endometrial polyp.   PLAN:  The patient will undergo hysteroscopy with D&C.  The risks of surgery  have been discussed including anesthetic concerns, the risk of infection,  the risk of vascular injury that could lead to hemorrhage requiring  transfusion or possible hysterectomy, transfusion  is carried with the risk  of AIDS or hepatitis, the risk of uterine perforation that could lead to  injury to bowel requiring laparoscopy, possible exploratory laparotomy, risk  of fluid overload leading to hyponatremia and pulmonary edema, and finally,  the risk of deep venous thrombosis and associated pulmonary embolus.  The  patient expressed understanding of indications and risks.                                               Juluis Mire, M.D.    JSM/MEDQ  D:  05/06/2003  T:  05/06/2003  Job:  [id number]   cc:   Titus Dubin. Alwyn Ren, M.D. [facility]

## 2011-01-22 NOTE — Op Note (Signed)
   Tara Bridges, Tara Bridges                          ACCOUNT NO.:  [id number]   MEDICAL RECORD NO.:  [id number]                   PATIENT TYPE:  AMB   LOCATION:  DSC                                  FACILITY:  [facility]   PHYSICIAN:  Cindee Salt, M.D.                    DATE OF BIRTH:  June 19, 1936   DATE OF PROCEDURE:  01/23/2003  DATE OF DISCHARGE:                                 OPERATIVE REPORT   PREOPERATIVE DIAGNOSES:  Felon, left index finger.   POSTOPERATIVE DIAGNOSES:  Felon, left index finger.   OPERATION:  Incision and drainage felon, left index finger.   SURGEON:  Cindee Salt, M.D.   ASSISTANT:  Imam   ANESTHESIA:  Metacarpal block.   HISTORY:  The patient is a 75 year old female with a history of swelling of  the pulp of the left index finger.  This occurred after gardening.  Approximately two days old.   PROCEDURE:  The patient is brought to the operating room.  A metacarpal  block given with 1% Xylocaine without epinephrine.  She is prepped using  Betadine scrub and solution.  Left arm free.  A Penrose drain was used for  tourniquet control at the base of the finger.  A mid lateral incision on the  ulnar side of the pulp was then made.  This crossed the DIP joint area.  With blunt and sharp dissection the area was opened.  No gross purulence was  encountered.  The wound was irrigated after cultures were taken for both  aerobic and anaerobic cultures.  This was then packed open.  Sterile  compressive dressing was applied.  The patient tolerated procedure well.  She is discharged home to return to  [facility] on Friday on Vicodin and Keflex.                                               Cindee Salt, M.D.    GK/MEDQ  D:  01/23/2003  T:  01/23/2003  Job:  [id number]

## 2011-01-22 NOTE — Op Note (Signed)
   NAME:  Tara Bridges, Tara Bridges                          ACCOUNT NO.:  [id number]   MEDICAL RECORD NO.:  [id number]                   PATIENT TYPE:  AMB   LOCATION:  SDC                                  FACILITY:  [facility]   PHYSICIAN:  Juluis Mire, M.D.                DATE OF BIRTH:  1936/02/19   DATE OF PROCEDURE:  05/06/2003  DATE OF DISCHARGE:                                 OPERATIVE REPORT   PREOPERATIVE DIAGNOSIS:  Postmenopausal bleeding with endometrial polyp.   POSTOPERATIVE DIAGNOSIS:  Postmenopausal bleeding with endometrial polyp.   OPERATION/PROCEDURE:  Hysteroscopy with resection of polyp, endometrial  biopsies and endometrial curettings.   ANESTHESIA:  General endotracheal.   ESTIMATED BLOOD LOSS:  Minimal.   PACKS:  None.   DRAINS:  None.   INTRAOPERATIVE BLOOD REPLACED:  None.   COMPLICATIONS:  None.   INDICATIONS:  Dictated in the history and physical.   DESCRIPTION OF PROCEDURE:  The patient was taken to the OR, placed in the  supine position.  After a satisfactory level of general anesthesia was  obtained, the patient was placed in the dorsal lithotomy position using the  Allen stirrups.  The peroneum and vaginal were cleansed out with Betadine  and draped as a sterile field.  A speculum was placed in the vaginal vault  and the cervix grasped with a single-toothed tenaculum.  The uterus sounded  to approximately 8 cm.  The cervix was serially dilated to a size 37 Pratt  dilator.  The operative hysteroscope was introduced.  The intrauterine  cavity was actually relatively smooth.  There was one small thickening  posteriorly.  This was resected.  Multiple endometrial biopsies were  obtained along with curettings.  There was no signs of perforation or  complications.  No active bleeding was noted.  The single-toothed tenaculum  and speculum were then removed.  The patient was taken out of the dorsal  lithotomy position once alert and extubated, transferred to the  recovery  room in good condition.  Sponge, instrument and needle counts were reported  as correct  by the surgical nurse.                                                Juluis Mire, M.D.    JSM/MEDQ  D:  05/06/2003  T:  05/06/2003  Job:  [id number]

## 2011-02-12 ENCOUNTER — Other Ambulatory Visit: Payer: Self-pay | Admitting: Dermatology

## 2011-02-12 ENCOUNTER — Encounter: Payer: Self-pay | Admitting: Internal Medicine

## 2011-02-23 ENCOUNTER — Other Ambulatory Visit: Payer: Self-pay | Admitting: Internal Medicine

## 2011-04-16 ENCOUNTER — Other Ambulatory Visit: Payer: Self-pay | Admitting: Dermatology

## 2011-06-08 ENCOUNTER — Ambulatory Visit (INDEPENDENT_AMBULATORY_CARE_PROVIDER_SITE_OTHER): Payer: Medicare Other | Admitting: Internal Medicine

## 2011-06-08 ENCOUNTER — Encounter: Payer: Self-pay | Admitting: Internal Medicine

## 2011-06-08 VITALS — BP 132/90 | HR 50 | Temp 98.2°F | Resp 12 | Ht 67.25 in | Wt 153.6 lb

## 2011-06-08 DIAGNOSIS — E785 Hyperlipidemia, unspecified: Secondary | ICD-10-CM

## 2011-06-08 DIAGNOSIS — K5730 Diverticulosis of large intestine without perforation or abscess without bleeding: Secondary | ICD-10-CM

## 2011-06-08 DIAGNOSIS — M949 Disorder of cartilage, unspecified: Secondary | ICD-10-CM

## 2011-06-08 DIAGNOSIS — Z136 Encounter for screening for cardiovascular disorders: Secondary | ICD-10-CM

## 2011-06-08 DIAGNOSIS — I10 Essential (primary) hypertension: Secondary | ICD-10-CM

## 2011-06-08 DIAGNOSIS — R9431 Abnormal electrocardiogram [ECG] [EKG]: Secondary | ICD-10-CM

## 2011-06-08 DIAGNOSIS — Z0000 Encounter for general adult medical examination without abnormal findings: Secondary | ICD-10-CM

## 2011-06-08 DIAGNOSIS — M899 Disorder of bone, unspecified: Secondary | ICD-10-CM

## 2011-06-08 DIAGNOSIS — Z85828 Personal history of other malignant neoplasm of skin: Secondary | ICD-10-CM

## 2011-06-08 DIAGNOSIS — Z23 Encounter for immunization: Secondary | ICD-10-CM

## 2011-06-08 DIAGNOSIS — E039 Hypothyroidism, unspecified: Secondary | ICD-10-CM

## 2011-06-08 LAB — CBC WITH DIFFERENTIAL/PLATELET
Basophils Relative: 0.9 % (ref 0.0–3.0)
Eosinophils Relative: 3.3 % (ref 0.0–5.0)
HCT: 39.8 % (ref 36.0–46.0)
Lymphs Abs: 1.6 10*3/uL (ref 0.7–4.0)
MCV: 95.3 fl (ref 78.0–100.0)
Monocytes Absolute: 0.4 10*3/uL (ref 0.1–1.0)
RBC: 4.17 Mil/uL (ref 3.87–5.11)
WBC: 4.6 10*3/uL (ref 4.5–10.5)

## 2011-06-08 LAB — BASIC METABOLIC PANEL
BUN: 19 mg/dL (ref 6–23)
CO2: 29 mEq/L (ref 19–32)
Chloride: 103 mEq/L (ref 96–112)
Potassium: 5.3 mEq/L — ABNORMAL HIGH (ref 3.5–5.1)

## 2011-06-08 LAB — HEPATIC FUNCTION PANEL
Bilirubin, Direct: 0 mg/dL (ref 0.0–0.3)
Total Bilirubin: 0.5 mg/dL (ref 0.3–1.2)

## 2011-06-08 LAB — LIPID PANEL
LDL Cholesterol: 86 mg/dL (ref 0–99)
Total CHOL/HDL Ratio: 2

## 2011-06-08 LAB — TSH: TSH: 1.51 u[IU]/mL (ref 0.35–5.50)

## 2011-06-08 MED ORDER — ATORVASTATIN CALCIUM 20 MG PO TABS: 10.0000 mg | ORAL_TABLET | Freq: Every day | ORAL | Status: DC

## 2011-06-08 NOTE — Progress Notes (Signed)
Subjective:    Patient ID: Tara Bridges, female    DOB: [birth year], 75 y.o.   MRN: [id number]  HPI Medicare Wellness Visit:  The following psychosocial & medical history were reviewed as required by Medicare.   Social history: caffeine: < 1 cup/ day , alcohol:  3 wines/ night ,  tobacco use : quit 1984  & exercise : weights& walking daily for 30-60 min.   Home & personal  safety / fall risk: no issues, activities of daily living: no limitations , seatbelt use : yes , and smoke alarm employment : yes .  Power of Attorney/Living Will status : needs updated  Vision ( as recorded per Nurse) & Hearing  evaluation :  Last Ophth exam 8/12, no issues ("eyes getting younger").Wall chart read @ 6 ft & whisper heard @ 6 ft. Orientation :oriented X3 , memory & recall :excellent, spelling or math testing: good,and mood & affect : normal . Depression / anxiety: denied Travel history : last [place] 2004 , immunization status :Shingles not taken (no PMH of Chickenpox) , transfusion history:  no, and preventive health surveillance ( colonoscopies, BMD , etc as per protocol/ [facility]): colonoscopy due 2015; Gyn seen annuallly, Dental care:  Every 6 mos . Chart reviewed &  Updated. Active issues reviewed & addressed.       Review of Systems HYPERTENSION: Disease Monitoring: Blood pressure range-< 140/90 except occasionally in afternoon  Chest pain, palpitations- no       Dyspnea- no Medications: Compliance- yes  Lightheadedness,Syncope- no    Edema- no  HYPERLIPIDEMIA: Disease Monitoring: See symptoms for Hypertension Medications: Compliance- yes  Abd pain, bowel changes- no   Muscle aches- no  ROS: She is flying 10/05 to [place] for a [place] cruise. She struck her knee 2 weeks ago and has some residual soreness. She's been using topical agents on this.No PMH of DVT or PTE.         Objective:   Physical Exam Gen.: Healthy and well-nourished in appearance. Alert, appropriate and cooperative  throughout exam.Appears younger than age. Head: Normocephalic without obvious abnormalities Eyes: No corneal or conjunctival inflammation noted. Pupils equal round reactive to light and accommodation. Extraocular motion intact.  Ears: External  ear exam reveals no significant lesions or deformities. Canals clear .TMs normal.  Nose: External nasal exam reveals no deformity or inflammation. Nasal mucosa are pink and moist. No lesions or exudates noted.  Mouth: Oral mucosa and oropharynx reveal no lesions or exudates. Teeth in good repair. Neck: No deformities, masses, or tenderness noted. Range of motion &. Thyroid : The thyroid is asymmetric with the right lobe greater than the left. The thyroid is firm without definite nodularity.. Lungs: Normal respiratory effort; chest expands symmetrically. Lungs are clear to auscultation without rales, wheezes, or increased work of breathing. Heart: Normal rate and rhythm. Normal S1 and S2. No gallop, click, or rub. S 4 with slight slurring; no murmur. Abdomen: Bowel sounds normal; abdomen soft and nontender. No masses, organomegaly or hernias noted. Genitalia: Dr Arelia Sneddon   .  Musculoskeletal/extremities: Scoliosis noted of  the thoracic spine. No clubbing, cyanosis, edema, or deformity noted. Range of motion  normal .Tone & strength  normal.Joints normal. Nail health  good. There is mild crepitus of the left knee and slight effusion. Homans sign is negative bilaterally. Vascular: Carotid, radial artery, dorsalis pedis and  posterior tibial pulses are full and equal. No bruits present. Neurologic: Alert and oriented x3. Deep tendon reflexes symmetrical and normal.          Skin: Intact without suspicious lesions or rashes. Lymph: No cervical, axillary  lymphadenopathy present. Psych: Mood and affect are normal. Normally interactive                                                                                          Assessment & Plan:  #1 Medicare Wellness Exam; criteria met ; data entered #2 Problem List reviewed ; Assessment/ Recommendations made  #3 mild left patellar effusion, post trauma  #4 abnormal EKG with nonspecific ST-T wave changes. These are stable dating back to 04/20/2004. She had a negative stress test at that time. Plan: see Orders

## 2011-06-08 NOTE — Patient Instructions (Addendum)
Preventive Health Care: Exercise  30-45  minutes a day, 3-4 days a week. Walking is especially valuable in preventing Osteoporosis. Eat a low-fat diet with lots of fruits and vegetables, up to 7-9 servings per day. Consume less than 30 grams of sugar per day from foods & drinks with High Fructose Corn Syrup as # 1,2,3 or #4 on label. Alcohol If you drink, do it moderately - 1 drink per day or less.  Health Care Power of Attorney & Living Will place you in charge of your health care  decisions. Verify these are  in place. Use the Celebrex samples twice a day for the knee pain as needed. Consider seeing your  orthopedist before your long distance flight if possible. While flying take a coated aspirin 325 mg daily

## 2011-06-25 ENCOUNTER — Encounter: Payer: Self-pay | Admitting: Internal Medicine

## 2011-08-03 ENCOUNTER — Other Ambulatory Visit: Payer: Self-pay | Admitting: Dermatology

## 2011-08-23 ENCOUNTER — Other Ambulatory Visit: Payer: Self-pay | Admitting: Internal Medicine

## 2011-08-27 ENCOUNTER — Other Ambulatory Visit: Payer: Self-pay | Admitting: Internal Medicine

## 2011-11-21 ENCOUNTER — Other Ambulatory Visit: Payer: Self-pay | Admitting: Internal Medicine

## 2012-02-22 ENCOUNTER — Encounter: Payer: Self-pay | Admitting: Internal Medicine

## 2012-05-12 ENCOUNTER — Other Ambulatory Visit: Payer: Self-pay | Admitting: Internal Medicine

## 2012-05-12 NOTE — Telephone Encounter (Signed)
rx sent to pharmacy by e-script for #30 no refills to last pt til upcoming OV

## 2012-06-12 ENCOUNTER — Encounter: Payer: Self-pay | Admitting: Internal Medicine

## 2012-06-12 ENCOUNTER — Ambulatory Visit (INDEPENDENT_AMBULATORY_CARE_PROVIDER_SITE_OTHER): Payer: Medicare Other | Admitting: Internal Medicine

## 2012-06-12 VITALS — BP 128/78 | HR 52 | Temp 98.2°F | Resp 12 | Ht 67.0 in | Wt 155.4 lb

## 2012-06-12 DIAGNOSIS — Z23 Encounter for immunization: Secondary | ICD-10-CM

## 2012-06-12 DIAGNOSIS — M949 Disorder of cartilage, unspecified: Secondary | ICD-10-CM

## 2012-06-12 DIAGNOSIS — E785 Hyperlipidemia, unspecified: Secondary | ICD-10-CM

## 2012-06-12 DIAGNOSIS — K5790 Diverticulosis of intestine, part unspecified, without perforation or abscess without bleeding: Secondary | ICD-10-CM

## 2012-06-12 DIAGNOSIS — I10 Essential (primary) hypertension: Secondary | ICD-10-CM

## 2012-06-12 DIAGNOSIS — K5730 Diverticulosis of large intestine without perforation or abscess without bleeding: Secondary | ICD-10-CM

## 2012-06-12 DIAGNOSIS — M899 Disorder of bone, unspecified: Secondary | ICD-10-CM

## 2012-06-12 DIAGNOSIS — E039 Hypothyroidism, unspecified: Secondary | ICD-10-CM

## 2012-06-12 DIAGNOSIS — Z0000 Encounter for general adult medical examination without abnormal findings: Secondary | ICD-10-CM

## 2012-06-12 LAB — LIPID PANEL
Cholesterol: 241 mg/dL — ABNORMAL HIGH (ref 0–200)
HDL: 72.2 mg/dL (ref >39.00)
Triglycerides: 120 mg/dL (ref 0.0–149.0)
VLDL: 24 mg/dL (ref 0.0–40.0)

## 2012-06-12 LAB — CBC WITH DIFFERENTIAL/PLATELET
Basophils Absolute: 0 10*3/uL (ref 0.0–0.1)
Eosinophils Absolute: 0.1 10*3/uL (ref 0.0–0.7)
Hemoglobin: 13.2 g/dL (ref 12.0–15.0)
Lymphocytes Relative: 34.9 % (ref 12.0–46.0)
Lymphs Abs: 1.3 10*3/uL (ref 0.7–4.0)
MCHC: 32.4 g/dL (ref 30.0–36.0)
MCV: 96.1 fl (ref 78.0–100.0)
Monocytes Absolute: 0.4 10*3/uL (ref 0.1–1.0)
Neutro Abs: 1.9 10*3/uL (ref 1.4–7.7)
RDW: 13.9 % (ref 11.5–14.6)

## 2012-06-12 LAB — HEPATIC FUNCTION PANEL
Albumin: 4.1 g/dL (ref 3.5–5.2)
Alkaline Phosphatase: 38 U/L — ABNORMAL LOW (ref 39–117)
Total Protein: 7.2 g/dL (ref 6.0–8.3)

## 2012-06-12 LAB — BASIC METABOLIC PANEL
CO2: 29 mEq/L (ref 19–32)
Calcium: 9.3 mg/dL (ref 8.4–10.5)
Chloride: 102 mEq/L (ref 96–112)
Glucose, Bld: 74 mg/dL (ref 70–99)
Sodium: 137 mEq/L (ref 135–145)

## 2012-06-12 LAB — TSH: TSH: 2.27 u[IU]/mL (ref 0.35–5.50)

## 2012-06-12 LAB — LDL CHOLESTEROL, DIRECT: Direct LDL: 149.6 mg/dL

## 2012-06-12 NOTE — Patient Instructions (Addendum)
Preventive Health Care: Exercise  30-45  minutes a day, 3-4 days a week. Walking is especially valuable in preventing Osteoporosis. Eat a low-fat diet with lots of fruits and vegetables, up to 7-9 servings per day.  Consume less than 30 grams of sugar per day from foods & drinks with High Fructose Corn Syrup as #1,2,3 or #4 on label. Eye Doctor - have an eye exam @ least annually Alcohol If you drink, do it moderately - 1 drink per day or less.  Health Care Power of Attorney & Living Will place you in charge of your health care  decisions. Verify these are  in place. Blood Pressure Goal  Ideally is an AVERAGE < 135/85. This AVERAGE should be calculated from @ least 5-7 BP readings taken @ different times of day on different days of week. You should not respond to isolated BP readings , but rather the AVERAGE for that week.  If you activate My Chart; the results can be released to you as soon as they populate from the lab. If you choose not to use this program; the labs have to be reviewed, copied & mailed   causing a delay in getting the results to you.  Take the EKG to any emergency room or preop visits. There are nonspecific changes; as long as there is no new change these are not clinically significant.Please take enteric-coated aspirin 81 mg daily with breakfast.   Review and correct the record as indicated. Please share record with all medical staff seen.

## 2012-06-12 NOTE — Progress Notes (Signed)
Subjective:    Patient ID: Tara Bridges, female    DOB: [birth year], 76 y.o.   MRN: [id number]  HPI Medicare Wellness Visit:  The following psychosocial & medical history were reviewed as required by Medicare.   Social history: caffeine: 1 cup coffee/ day , alcohol: 21 glasses of wine / week ,  tobacco use : quit 1984 & exercise : walking daily 45 min .   Home & personal  safety / fall risk: no issues, activities of daily living: no limitations, seatbelt use : yes , and smoke alarm employment : yes .  Power of Attorney/Living Will status : NO  Vision ( as recorded per Nurse) & Hearing  evaluation :  Ophth exam current.No hearing exam Orientation :oriented X3 , memory & recall : good,  math testing: good,and mood & affect : normal . Depression / anxiety: denied Travel history : [place] , immunization status :Flu today , transfusion history:  no, and preventive health surveillance ( colonoscopies, BMD , etc as per protocol/ [facility]): colonoscopy up to date, Dental care:  Every 6 mos . Chart reviewed &  Updated. Active issues reviewed & addressed.       Review of Systems HYPERTENSION: Disease Monitoring: Blood pressure range- 115/60+- 148/93  Chest pain, palpitations- no       Dyspnea- no Medications: Compliance- yes  Lightheadedness,Syncope- no   Edema- no  HYPERLIPIDEMIA: Disease Monitoring: See symptoms for Hypertension Medications: Compliance- statin stopped  Abd pain, bowel changes- no   Muscle aches- not off statin      Objective:   Physical Exam Gen.: Healthy and well-nourished in appearance. Alert, appropriate and cooperative throughout exam.Appears younger than stated age  Head: Normocephalic without obvious abnormalities  Eyes: No corneal or conjunctival inflammation noted. Pupils equal round reactive to light and accommodation. Fundal exam is benign without hemorrhages, exudate, papilledema. Extraocular motion intact. Vision grossly normal. Ears: External   ear exam reveals no significant lesions or deformities. Canals clear .TMs normal. Hearing is grossly normal bilaterally. Nose: External nasal exam reveals no deformity or inflammation. Nasal mucosa are pink and moist. No lesions or exudates noted.  Mouth: Oral mucosa and oropharynx reveal no lesions or exudates. Teeth in good repair. Neck: No deformities, masses, or tenderness noted. Range of motion & Thyroid normal. Lungs: Normal respiratory effort; chest expands symmetrically. Lungs are clear to auscultation without rales, wheezes, or increased work of breathing. Heart: Normal rate and rhythm. Normal S1 and S2. No gallop, click, or rub. S4 w/o murmur. Abdomen: Bowel sounds normal; abdomen soft and nontender. No masses, organomegaly or hernias noted. Genitalia: Dr Arelia Sneddon, Gyn                                                    Musculoskeletal/extremities: There is some asymmetry of the lower posterior thoracic musculature suggesting occult scoliosis.  No clubbing, cyanosis, edema, or deformity noted. Range of motion  normal .Tone & strength  normal.Joints normal. Nail health  good. Vascular: Carotid, radial artery, dorsalis pedis and  posterior tibial pulses are full and equal. No bruits present. Neurologic: Alert and oriented x3. Deep tendon reflexes symmetrical and normal.          Skin: Intact without suspicious lesions or rashes. Lymph: No cervical, axillary lymphadenopathy present. Psych: Mood and affect are normal. Normally interactive  Assessment & Plan:  #1 Medicare Wellness Exam; criteria met ; data entered #2 Problem List reviewed ; Assessment/ Recommendations made Plan: see Orders

## 2012-06-15 LAB — VITAMIN D 1,25 DIHYDROXY: Vitamin D2 1, 25 (OH)2: <8 pg/mL

## 2012-06-16 ENCOUNTER — Encounter: Payer: Self-pay | Admitting: *Deleted

## 2012-06-19 ENCOUNTER — Other Ambulatory Visit: Payer: Self-pay | Admitting: Internal Medicine

## 2012-08-15 ENCOUNTER — Other Ambulatory Visit: Payer: Self-pay | Admitting: Internal Medicine

## 2012-09-06 HISTORY — PX: MOHS SURGERY: SUR867

## 2012-09-12 ENCOUNTER — Other Ambulatory Visit: Payer: Self-pay | Admitting: Internal Medicine

## 2013-02-06 ENCOUNTER — Other Ambulatory Visit: Payer: Self-pay | Admitting: Dermatology

## 2013-03-05 ENCOUNTER — Other Ambulatory Visit: Payer: Self-pay | Admitting: Dermatology

## 2013-03-20 ENCOUNTER — Other Ambulatory Visit: Payer: Self-pay | Admitting: *Deleted

## 2013-03-20 MED ORDER — LISINOPRIL 10 MG PO TABS: ORAL_TABLET | ORAL | Status: DC

## 2013-03-20 NOTE — Telephone Encounter (Signed)
Rx sent 

## 2013-04-18 ENCOUNTER — Other Ambulatory Visit: Payer: Self-pay | Admitting: Internal Medicine

## 2013-04-18 NOTE — Telephone Encounter (Signed)
Schedule CPX appointment

## 2013-05-28 ENCOUNTER — Other Ambulatory Visit: Payer: Self-pay | Admitting: Dermatology

## 2013-06-15 ENCOUNTER — Telehealth: Payer: Self-pay

## 2013-06-15 NOTE — Telephone Encounter (Signed)
LM for CB  HM reviewed. MMG 02/2012 Due as noted: PNA Tdap Zoztavax Flu

## 2013-06-15 NOTE — Telephone Encounter (Signed)
Medication and allergies: Done  Pharmacy updated, uses [facility] [address]  for 90 day supply Pharmacy updated, uses [facility] [address] for local pharmacy  HM UTD: WE Immunizations due: admin flu vaccine upon arrival  Last Tdap is in paper chart. Possibly due for booster.  A/P:  Last:  PAP:            MMG: Due 02/2012  Dexa: Not on file  CCS: Not on file DM: na HTN: due Lipids: due  To Discuss with Provider: Discuss Lipitor Book she read by Dr. Nestor Lewandowsky(?)

## 2013-06-18 ENCOUNTER — Ambulatory Visit (INDEPENDENT_AMBULATORY_CARE_PROVIDER_SITE_OTHER): Payer: Medicare Other | Admitting: Internal Medicine

## 2013-06-18 ENCOUNTER — Encounter: Payer: Self-pay | Admitting: Internal Medicine

## 2013-06-18 VITALS — BP 135/70 | HR 68 | Temp 97.8°F | Ht 66.75 in | Wt 149.4 lb

## 2013-06-18 DIAGNOSIS — E039 Hypothyroidism, unspecified: Secondary | ICD-10-CM

## 2013-06-18 DIAGNOSIS — K5730 Diverticulosis of large intestine without perforation or abscess without bleeding: Secondary | ICD-10-CM

## 2013-06-18 DIAGNOSIS — Z0000 Encounter for general adult medical examination without abnormal findings: Secondary | ICD-10-CM

## 2013-06-18 DIAGNOSIS — I10 Essential (primary) hypertension: Secondary | ICD-10-CM

## 2013-06-18 DIAGNOSIS — E785 Hyperlipidemia, unspecified: Secondary | ICD-10-CM

## 2013-06-18 DIAGNOSIS — M899 Disorder of bone, unspecified: Secondary | ICD-10-CM

## 2013-06-18 DIAGNOSIS — Z23 Encounter for immunization: Secondary | ICD-10-CM

## 2013-06-18 LAB — HEPATIC FUNCTION PANEL
ALT: 15 U/L (ref 0–35)
Albumin: 4.2 g/dL (ref 3.5–5.2)
Alkaline Phosphatase: 38 U/L — ABNORMAL LOW (ref 39–117)
Bilirubin, Direct: 0 mg/dL (ref 0.0–0.3)
Total Bilirubin: 0.6 mg/dL (ref 0.3–1.2)

## 2013-06-18 LAB — CBC WITH DIFFERENTIAL/PLATELET
Basophils Absolute: 0 10*3/uL (ref 0.0–0.1)
Eosinophils Absolute: 0.1 10*3/uL (ref 0.0–0.7)
HCT: 41.9 % (ref 36.0–46.0)
Lymphocytes Relative: 31.3 % (ref 12.0–46.0)
MCHC: 33.4 g/dL (ref 30.0–36.0)
Neutro Abs: 2.5 10*3/uL (ref 1.4–7.7)
Neutrophils Relative %: 57.7 % (ref 43.0–77.0)
Platelets: 224 10*3/uL (ref 150.0–400.0)
RDW: 13.7 % (ref 11.5–14.6)

## 2013-06-18 LAB — BASIC METABOLIC PANEL
BUN: 21 mg/dL (ref 6–23)
CO2: 27 mEq/L (ref 19–32)
Calcium: 9.5 mg/dL (ref 8.4–10.5)
Creatinine, Ser: 1 mg/dL (ref 0.4–1.2)
Glucose, Bld: 89 mg/dL (ref 70–99)

## 2013-06-18 LAB — LIPID PANEL
HDL: 94 mg/dL (ref >39.00)
VLDL: 12.2 mg/dL (ref 0.0–40.0)

## 2013-06-18 LAB — LDL CHOLESTEROL, DIRECT: Direct LDL: 138.4 mg/dL

## 2013-06-18 MED ORDER — LEVOTHYROXINE SODIUM 88 MCG PO TABS: ORAL_TABLET | ORAL | Status: DC

## 2013-06-18 MED ORDER — LISINOPRIL 10 MG PO TABS: ORAL_TABLET | ORAL | Status: DC

## 2013-06-18 NOTE — Progress Notes (Signed)
Subjective:    Patient ID: Tara Bridges, female    DOB: Jul 05, 1936, 77 y.o.   MRN: [id number]  HPI Medicare Wellness Visit: Psychosocial and medical history were reviewed as required by Medicare (abuse, antisocial behavior risk, forearm risk). Social history: Caffeine:1 cup coffee/ day  , Alcohol: 21 glasses wine / week , Tobacco use: quit 1984 Exercise:see below Personal safety/fall risk: no Limitations of activities of daily living:no Seatbelt smoke alarm use:yes Healthcare Power of Attorney/Living Will status: LW needed Ophthalmologic exam status:current Hearing evaluation status: not current Orientation: Oriented X3 Memory and recall: good Spelling  testing: good Depression/anxiety assessment: denied Foreign travel history:2012 Immunization status the shingles/bleeding/pneumonia/tetanus:Shingles never taken. Flu today Transfusion history:no Preventive health care maintenance status: Colonoscopy/BMD/mammogram/Pap as per protocol/standard care:current Dental care:every 6 months Chart reviewed and updated. Active issues reviewed and addressed.    Review of Systems She is on a heart healthy diet; she exercises as walking daily > 45 minutes & exercise program weekly per week without symptoms. Specifically she denies chest pain, palpitations, dyspnea, or claudication.  Family history is negative for premature coronary disease. Advanced cholesterol testing reveals her LDL goal is less than 115. Off the statin due to memory concerns.  Significant abdominal symptoms, memory deficit, or myalgias denied. .     Objective:   Physical Exam Gen.: Healthy and well-nourished in appearance. Alert, appropriate and cooperative throughout exam.Appears much younger than stated age  Head: Normocephalic without obvious abnormalities  Eyes: No corneal or conjunctival inflammation noted. Pupils equal round reactive to light and accommodation.  Extraocular motion intact. Vision grossly normal with  lenses (intra ocular) Ears: External  ear exam reveals no significant lesions or deformities. Canals clear .TMs normal. Hearing is grossly normal bilaterally. Nose: External nasal exam reveals no deformity or inflammation. Nasal mucosa are pink and moist. No lesions or exudates noted.  Mouth: Oral mucosa and oropharynx reveal no lesions or exudates. Teeth in good repair. Neck: No deformities, masses, or tenderness noted. Range of motion good. Thyroid : goiter suggested. Lungs: Normal respiratory effort; chest expands symmetrically. Lungs are clear to auscultation without rales, wheezes, or increased work of breathing. Heart: Normal rate and rhythm. Normal S1 and S2. No gallop, click, or rub. S4 w/o murmur. Abdomen: Bowel sounds normal; abdomen soft and nontender. No masses, organomegaly or hernias noted. Genitalia:As per Gyn                                  Musculoskeletal/extremities: There is some asymmetry of the lower thoracic musculature suggesting occult scoliosis. No clubbing, cyanosis, edema, or significant extremity  deformity noted. Range of motion normal .Tone & strength  Normal. Joints normal . Nail health good. Able to lie down & sit up w/o help. Negative SLR bilaterally Vascular: Carotid, radial artery, dorsalis pedis and  posterior tibial pulses are full and equal. No bruits present. Neurologic: Alert and oriented x3. Deep tendon reflexes symmetrical and normal.        Skin: Intact without suspicious lesions or rashes. Lymph: No cervical, axillary lymphadenopathy present. Psych: Mood and affect are normal. Normally interactive  Assessment & Plan:  #1 Medicare Wellness Exam; criteria met ; data entered #2 Problem List/Diagnoses reviewed Plan:  Assessments made/ Orders entered  

## 2013-06-18 NOTE — Patient Instructions (Signed)
Preventive Health Care: Exercise  30-45  minutes a day, 3-4 days a week. Walking is especially valuable in preventing Osteoporosis. Eat a low-fat diet with lots of fruits and vegetables, up to 7-9 servings per day. This would eliminate need for vitamin supplements for most individuals. Consume less than 30 grams of sugar per day from foods & drinks with High Fructose Corn Syrup as #2,3 or #4 on label. Alcohol If you drink, do it moderately - 1 drink per day or less.  The legal document "Health Care Power of Attorney & Living Will " verifies decisions concerning your health care.

## 2013-06-20 ENCOUNTER — Encounter: Payer: Self-pay | Admitting: *Deleted

## 2013-06-20 NOTE — Progress Notes (Signed)
Letter mailed to patient.

## 2013-06-22 LAB — VITAMIN D 1,25 DIHYDROXY
Vitamin D 1, 25 (OH)2 Total: 31 pg/mL (ref 18–72)
Vitamin D3 1, 25 (OH)2: 31 pg/mL

## 2013-06-24 ENCOUNTER — Encounter: Payer: Self-pay | Admitting: Internal Medicine

## 2013-06-24 DIAGNOSIS — E559 Vitamin D deficiency, unspecified: Secondary | ICD-10-CM | POA: Insufficient documentation

## 2013-06-25 ENCOUNTER — Encounter: Payer: Self-pay | Admitting: General Practice

## 2013-07-27 ENCOUNTER — Other Ambulatory Visit: Payer: Self-pay

## 2013-07-27 MED ORDER — LEVOTHYROXINE SODIUM 88 MCG PO TABS: ORAL_TABLET | ORAL | Status: DC

## 2014-03-15 ENCOUNTER — Other Ambulatory Visit: Payer: Self-pay | Admitting: *Deleted

## 2014-03-15 MED ORDER — LEVOTHYROXINE SODIUM 88 MCG PO TABS: ORAL_TABLET | ORAL | Status: DC

## 2014-05-06 ENCOUNTER — Encounter: Payer: Self-pay | Admitting: Internal Medicine

## 2014-06-09 ENCOUNTER — Other Ambulatory Visit: Payer: Self-pay | Admitting: Internal Medicine

## 2014-06-24 ENCOUNTER — Encounter: Payer: Medicare Other | Admitting: Internal Medicine

## 2014-07-01 ENCOUNTER — Other Ambulatory Visit: Payer: Self-pay | Admitting: Internal Medicine

## 2014-07-01 ENCOUNTER — Encounter: Payer: Self-pay | Admitting: Internal Medicine

## 2014-07-01 ENCOUNTER — Other Ambulatory Visit (INDEPENDENT_AMBULATORY_CARE_PROVIDER_SITE_OTHER): Payer: Medicare HMO

## 2014-07-01 ENCOUNTER — Ambulatory Visit (INDEPENDENT_AMBULATORY_CARE_PROVIDER_SITE_OTHER): Payer: Medicare HMO | Admitting: Internal Medicine

## 2014-07-01 VITALS — BP 120/74 | HR 49 | Temp 97.6°F | Resp 13 | Ht 66.5 in | Wt 152.2 lb

## 2014-07-01 DIAGNOSIS — I10 Essential (primary) hypertension: Secondary | ICD-10-CM

## 2014-07-01 DIAGNOSIS — E785 Hyperlipidemia, unspecified: Secondary | ICD-10-CM

## 2014-07-01 DIAGNOSIS — Z23 Encounter for immunization: Secondary | ICD-10-CM

## 2014-07-01 DIAGNOSIS — K5730 Diverticulosis of large intestine without perforation or abscess without bleeding: Secondary | ICD-10-CM

## 2014-07-01 DIAGNOSIS — E559 Vitamin D deficiency, unspecified: Secondary | ICD-10-CM

## 2014-07-01 DIAGNOSIS — E038 Other specified hypothyroidism: Secondary | ICD-10-CM

## 2014-07-01 DIAGNOSIS — R9431 Abnormal electrocardiogram [ECG] [EKG]: Secondary | ICD-10-CM

## 2014-07-01 DIAGNOSIS — M8580 Other specified disorders of bone density and structure, unspecified site: Secondary | ICD-10-CM

## 2014-07-01 LAB — BASIC METABOLIC PANEL
BUN: 19 mg/dL (ref 6–23)
CHLORIDE: 103 meq/L (ref 96–112)
CO2: 28 mEq/L (ref 19–32)
Calcium: 9.7 mg/dL (ref 8.4–10.5)
Creatinine, Ser: 0.9 mg/dL (ref 0.4–1.2)
GFR: 65.48 mL/min (ref >60.00)
Glucose, Bld: 77 mg/dL (ref 70–99)
Potassium: 4.4 mEq/L (ref 3.5–5.1)
Sodium: 140 mEq/L (ref 135–145)

## 2014-07-01 LAB — HEPATIC FUNCTION PANEL
ALT: 15 U/L (ref 0–35)
AST: 21 U/L (ref 0–37)
Albumin: 3.8 g/dL (ref 3.5–5.2)
Alkaline Phosphatase: 50 U/L (ref 39–117)
BILIRUBIN DIRECT: 0.1 mg/dL (ref 0.0–0.3)
TOTAL PROTEIN: 7.5 g/dL (ref 6.0–8.3)
Total Bilirubin: 0.7 mg/dL (ref 0.2–1.2)

## 2014-07-01 LAB — CBC WITH DIFFERENTIAL/PLATELET
BASOS PCT: 1 % (ref 0.0–3.0)
Basophils Absolute: 0 10*3/uL (ref 0.0–0.1)
EOS PCT: 2.1 % (ref 0.0–5.0)
Eosinophils Absolute: 0.1 10*3/uL (ref 0.0–0.7)
HCT: 41.8 % (ref 36.0–46.0)
Hemoglobin: 14 g/dL (ref 12.0–15.0)
LYMPHS PCT: 39 % (ref 12.0–46.0)
Lymphs Abs: 1.7 10*3/uL (ref 0.7–4.0)
MCHC: 33.6 g/dL (ref 30.0–36.0)
MCV: 94.5 fl (ref 78.0–100.0)
Monocytes Absolute: 0.5 10*3/uL (ref 0.1–1.0)
Monocytes Relative: 10.2 % (ref 3.0–12.0)
Neutro Abs: 2.1 10*3/uL (ref 1.4–7.7)
Neutrophils Relative %: 47.7 % (ref 43.0–77.0)
PLATELETS: 217 10*3/uL (ref 150.0–400.0)
RBC: 4.43 Mil/uL (ref 3.87–5.11)
RDW: 13.8 % (ref 11.5–15.5)
WBC: 4.4 10*3/uL (ref 4.0–10.5)

## 2014-07-01 LAB — VITAMIN D 25 HYDROXY (VIT D DEFICIENCY, FRACTURES): VITD: 86.6 ng/mL (ref 30.00–100.00)

## 2014-07-01 LAB — TSH: TSH: 1.37 u[IU]/mL (ref 0.35–4.50)

## 2014-07-01 MED ORDER — LEVOTHYROXINE SODIUM 88 MCG PO TABS: ORAL_TABLET | ORAL | Status: DC

## 2014-07-01 MED ORDER — LISINOPRIL 10 MG PO TABS: ORAL_TABLET | ORAL | Status: DC

## 2014-07-01 NOTE — Progress Notes (Signed)
   Subjective:    Patient ID: Tara Bridges, female    DOB: 1936-05-28, 78 y.o.   MRN: [id number]  HPI  She is here to assess active health issues & conditions. PMH, FH, & Social history verified & updated    She is compliant with her medications without adverse effects. She has been taking the branded thyroid replacement  She is on a heart healthy ,low-salt diet  Her tracking device indicates she walks 17-23 miles per week without cardio pulmonary symptoms  Blood pressure ranges 120/ 70s-140/80-90.  She has been statin intolerant. Advanced cholesterol testing indicates that her LDL goal is less than 115.  There is no premature history of coronary artery disease in the family or stroke.    Review of Systems  All below NEGATIVE: Chest pain, palpitations       Dyspnea Edema Claudication  Lightheadedness,Syncope Weight change Polyuria/phagia/dipsia    Blurred vision /diplopia/lossof vision Limb numbness/tingling/burning Non healing skin lesions Abd pain, bowel changes   Myalgias      Objective:   Physical Exam  Gen.: Healthy and well-nourished in appearance. Alert, appropriate and cooperative throughout exam. Appears younger than stated age  Head: Normocephalic without obvious abnormalities  Eyes: No corneal or conjunctival inflammation noted. Pupils equal round reactive to light and accommodation. Extraocular motion intact.  Ears: External  ear exam reveals no significant lesions or deformities. Canals clear .TMs normal. Hearing is grossly normal bilaterally. Nose: External nasal exam reveals no deformity or inflammation. Nasal mucosa are pink and moist. No lesions or exudates noted.   Mouth: Oral mucosa and oropharynx reveal no lesions or exudates. Teeth in good repair. Neck: No deformities, masses, or tenderness noted. Range of motion slightly decreased. Thyroid :physiologic asymmetry, R >L w/o nodules. Lungs: Normal respiratory effort; chest expands symmetrically.  Lungs are clear to auscultation without rales, wheezes, or increased work of breathing. Heart: Slow rate and regular rhythm. Normal S1 and S2. No gallop, click, or rub. No murmur. Abdomen: Bowel sounds normal; abdomen soft and nontender. No masses, organomegaly or hernias noted. Genitalia:  as per Gyn                                  Musculoskeletal/extremities: There is some asymmetry of the inferior thoracic musculature suggesting occult scoliosis. No clubbing, cyanosis, edema, or significant extremity  deformity noted. Range of motion normal .Tone & strength normal. Hand joints normal Able to lie down & sit up w/o help. Negative SLR bilaterally Vascular: Carotid, radial artery, dorsalis pedis and  posterior tibial pulses are full and equal. No bruits present. Neurologic: Alert and oriented x3. Deep tendon reflexes symmetrical and normal.  Gait normal .     Skin: Intact without suspicious lesions or rashes. Lymph: No cervical, axillary lymphadenopathy present. Psych: Mood and affect are normal. Normally interactive                                                                                       Assessment & Plan:  See Current Assessment & Plan in Problem List under specific Diagnosis

## 2014-07-01 NOTE — Assessment & Plan Note (Signed)
Vitamin D level 

## 2014-07-01 NOTE — Assessment & Plan Note (Signed)
TSH 

## 2014-07-01 NOTE — Patient Instructions (Addendum)
Your next office appointment will be determined based upon review of your pending labs . Those instructions will be transmitted to you by mail.  Take the EKG to any emergency room or preop visits. There are nonspecific changes; as long as there is no new change these are not clinically significant . If the old EKG is not available for comparison; it may result in unnecessary hospitalization for observation with significant unnecessary expense.

## 2014-07-01 NOTE — Progress Notes (Signed)
Pre visit review using our clinic review tool, if applicable. No additional management support is needed unless otherwise documented below in the visit note. 

## 2014-07-01 NOTE — Assessment & Plan Note (Signed)
Blood pressure goals reviewed. BMET 

## 2014-07-01 NOTE — Assessment & Plan Note (Signed)
NMR Lipoprofile, hepatic panel,TSH 

## 2014-07-01 NOTE — Assessment & Plan Note (Signed)
Due 07/2014 CBC & dif

## 2014-07-02 ENCOUNTER — Telehealth: Payer: Self-pay | Admitting: Internal Medicine

## 2014-07-02 NOTE — Telephone Encounter (Signed)
emmi mailed  °

## 2014-07-03 LAB — NMR LIPOPROFILE WITH LIPIDS
CHOLESTEROL, TOTAL: 236 mg/dL — AB (ref 100–199)
HDL Particle Number: 46.1 umol/L (ref >=30.5)
HDL SIZE: 10.1 nm (ref >=9.2)
HDL-C: 103 mg/dL (ref >39)
LARGE HDL: 18.4 umol/L (ref >=4.8)
LDL (calc): 122 mg/dL — ABNORMAL HIGH (ref 0–99)
LDL Particle Number: 1165 nmol/L — ABNORMAL HIGH (ref <1000)
LDL Size: 21.3 nm (ref >=20.8)
LP-IR Score: <25 (ref <=45)
Large VLDL-P: 1.9 nmol/L (ref <=2.7)
Small LDL Particle Number: <90 nmol/L (ref <=527)
TRIGLYCERIDES: 55 mg/dL (ref 0–149)
VLDL Size: 46.9 nm — ABNORMAL HIGH (ref <=46.6)

## 2014-07-16 ENCOUNTER — Encounter: Payer: Medicare Other | Admitting: Internal Medicine

## 2015-04-14 ENCOUNTER — Telehealth: Payer: Self-pay

## 2015-04-14 NOTE — Telephone Encounter (Signed)
Patient called to educate on Medicare Wellness apt. LVM for the patient to call back to educate and schedule for wellness visit.   

## 2015-04-15 NOTE — Telephone Encounter (Signed)
2nd outreach for awv; LVM for the patient to call back to educate and schedule for wellness visit.

## 2015-04-21 ENCOUNTER — Telehealth: Payer: Self-pay

## 2015-04-21 ENCOUNTER — Other Ambulatory Visit: Payer: Self-pay | Admitting: Obstetrics and Gynecology

## 2015-04-21 NOTE — Telephone Encounter (Signed)
[name] stated she was scheduled to see Dr. Linna Darner at the end of Oct or AWV; Educated regarding AWV and stated Dr. Linna Darner can complete or he can defer to me post exam depending on her needs. The patient stated she was just informed that she needs another colonoscopy and also has been informed she has thyroid nodules and to discuss with Linna Darner on her exam.

## 2015-04-22 ENCOUNTER — Other Ambulatory Visit: Payer: Self-pay | Admitting: Obstetrics and Gynecology

## 2015-04-22 DIAGNOSIS — E041 Nontoxic single thyroid nodule: Secondary | ICD-10-CM

## 2015-04-23 LAB — CYTOLOGY - PAP

## 2015-04-24 ENCOUNTER — Other Ambulatory Visit: Payer: Medicare HMO

## 2015-04-29 ENCOUNTER — Ambulatory Visit
Admission: RE | Admit: 2015-04-29 | Discharge: 2015-04-29 | Disposition: A | Discharge status: Home / Self Care | Payer: PPO | Source: Ambulatory Visit | Attending: Obstetrics and Gynecology | Admitting: Obstetrics and Gynecology

## 2015-04-29 DIAGNOSIS — E041 Nontoxic single thyroid nodule: Secondary | ICD-10-CM

## 2015-04-29 IMAGING — US US SOFT TISSUE HEAD/NECK
1 series · 14 of 25 positions shown · non-contrast
Comparison: 01/14/2011

CLINICAL DATA: Right thyroid nodule on physical examination.

EXAM:
THYROID ULTRASOUND
TECHNIQUE: Ultrasound examination of the thyroid gland and adjacent soft
tissues was performed.

[Series 1: us soft tissue head/neck · 0.06mm/px · 14 of 30 slices shown]
[im 1/30]
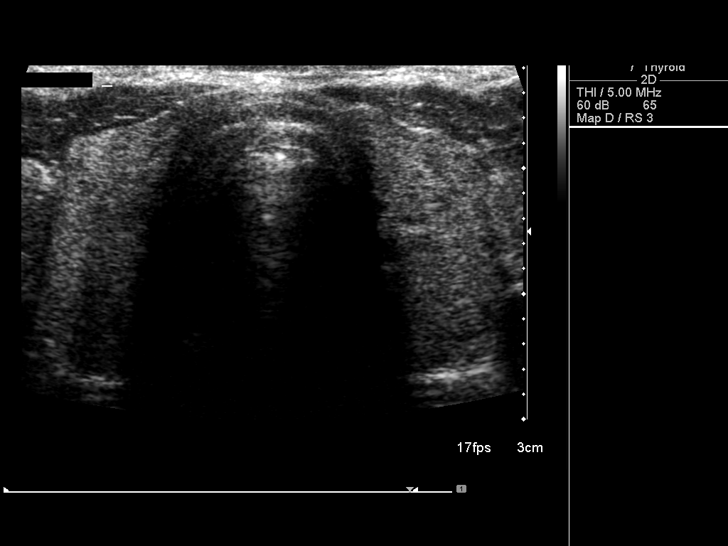
[im 3/30]
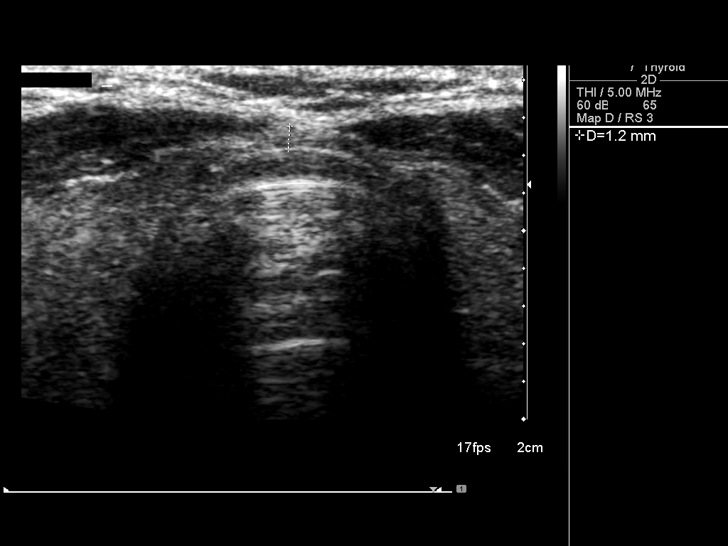
[im 5/30]
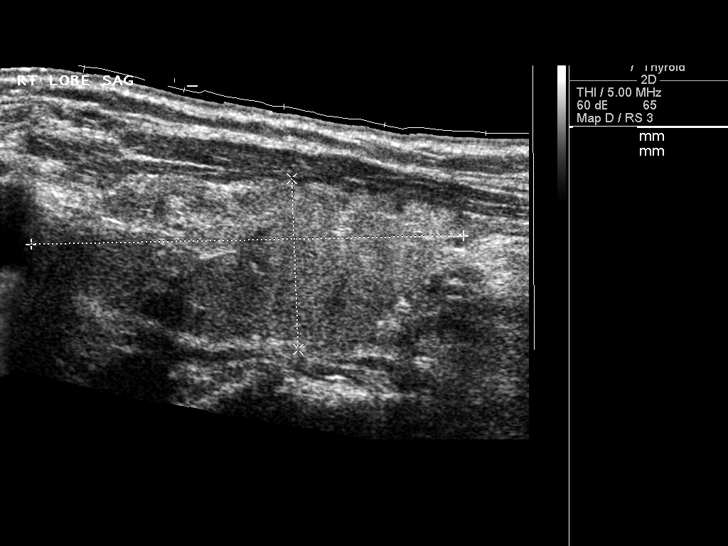
[im 8/30]
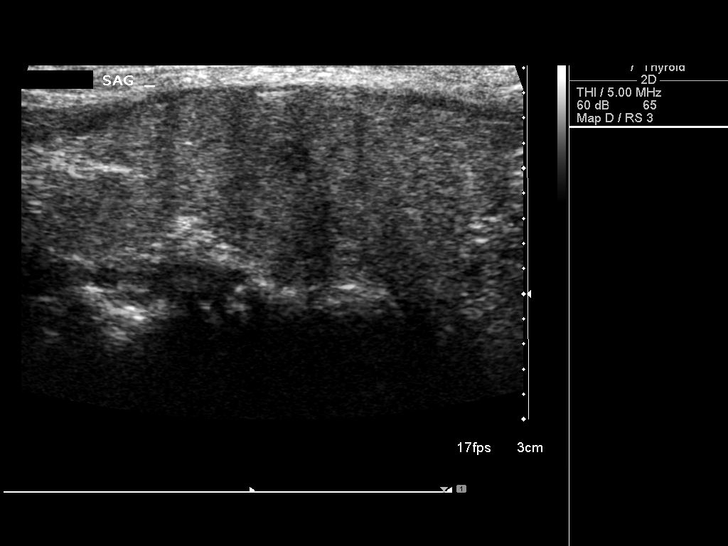
[im 10/30]
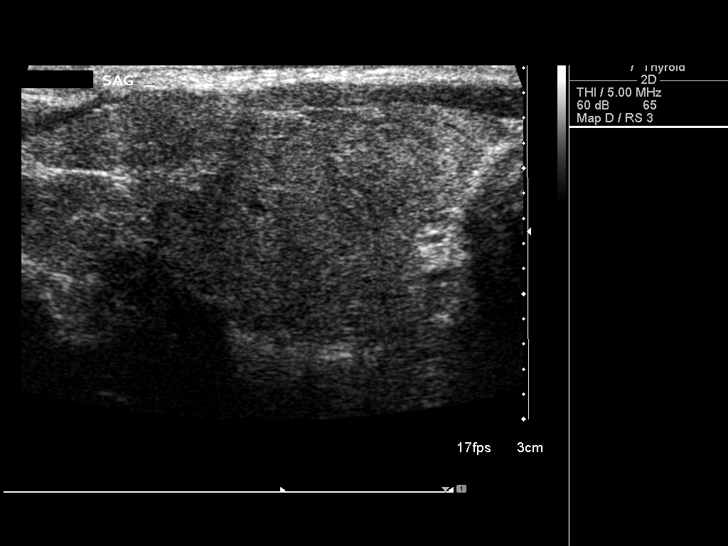
[im 11/30]
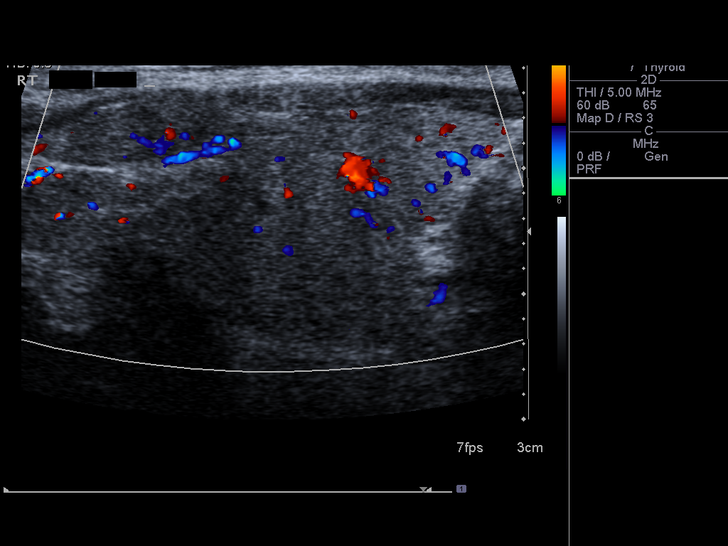
[im 14/30]
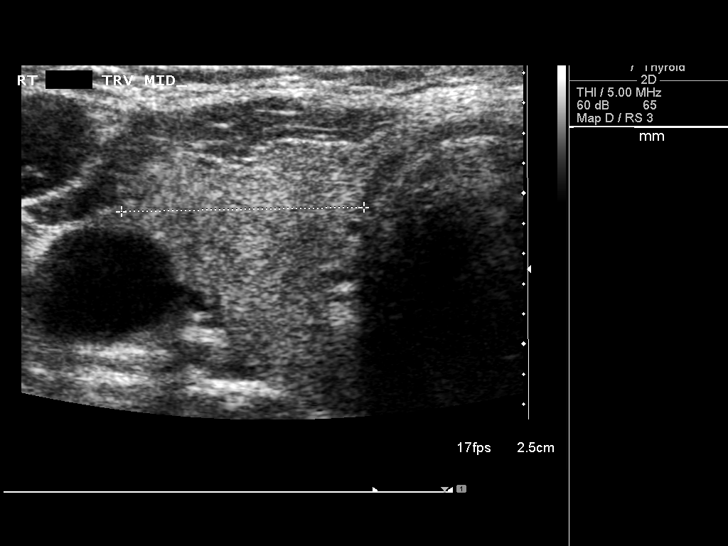
[im 16/30]
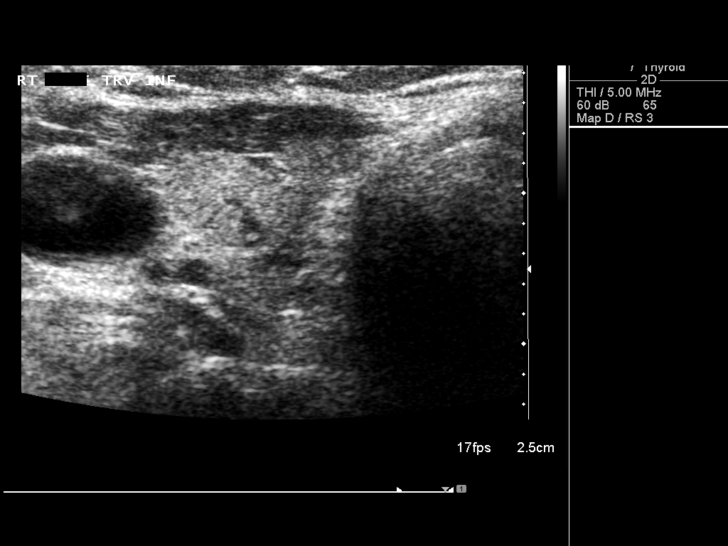
[im 19/30]
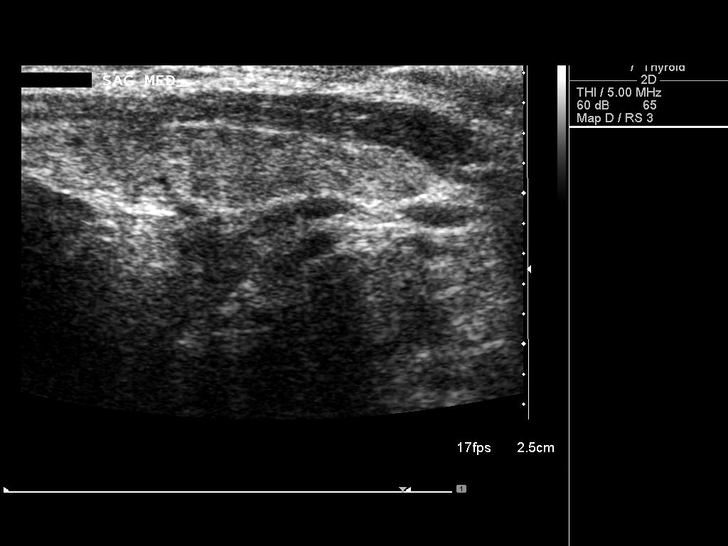
[im 20/30]
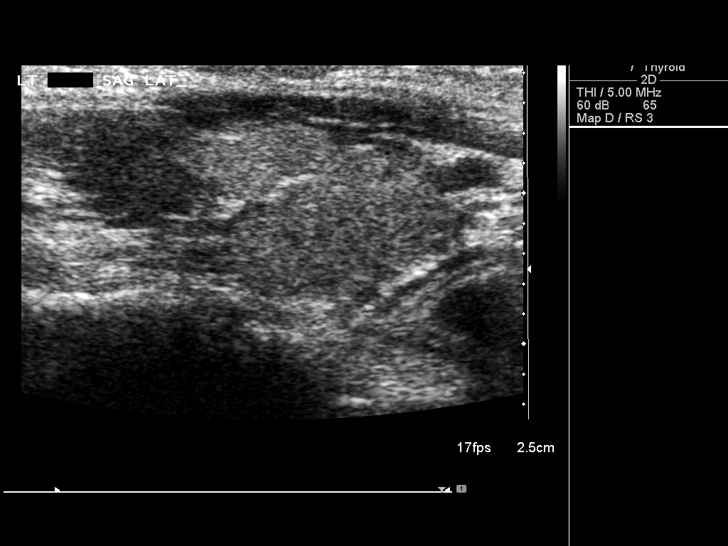
[im 22/30]
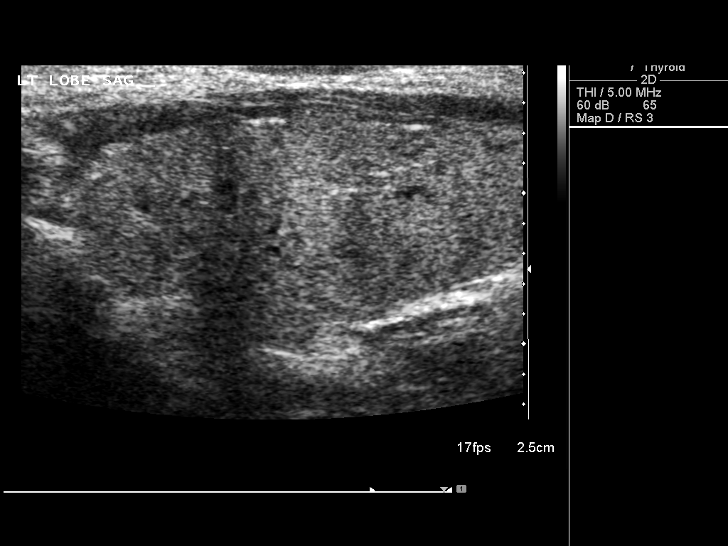
[im 25/30]
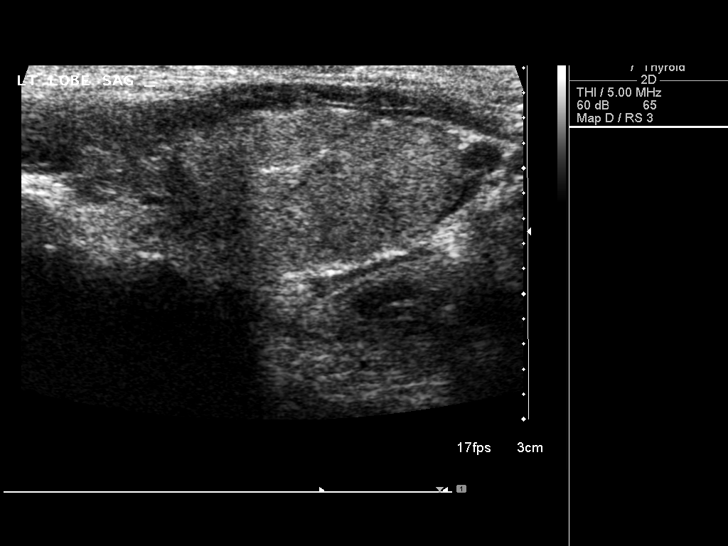
[im 27/30]
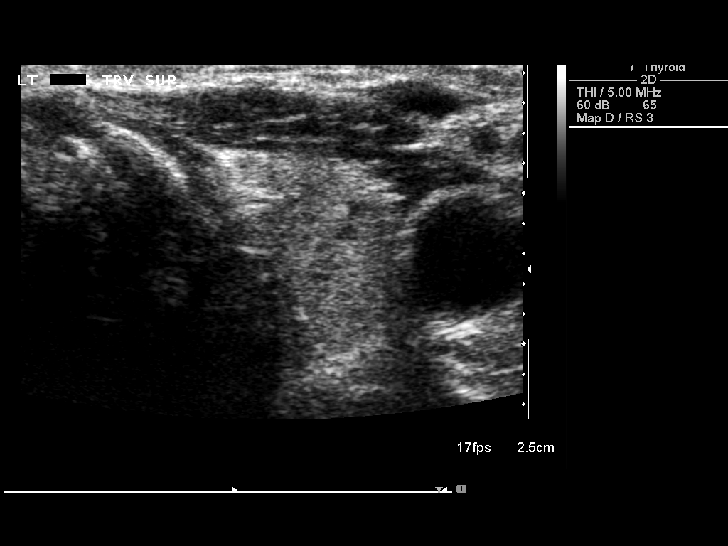
[im 30/30]
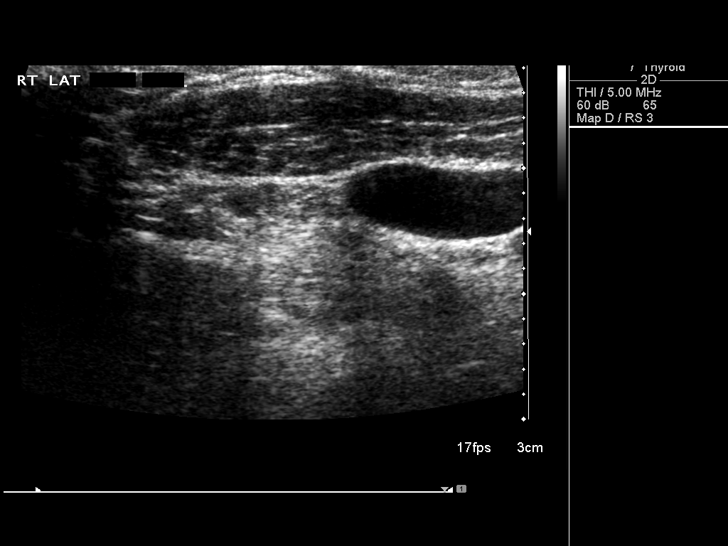

[14 of 25 positions shown; findings below may reference images not displayed]

FINDINGS: Right thyroid lobe

Measurements: 4.2 x 1.7 x 1.6 cm. Right thyroid tissue is mildly
heterogeneous without a focal nodule.

Left thyroid lobe

Measurements: 3.8 x 1.6 x 1.5 cm. Mild heterogeneity without a focal
nodule.

Isthmus

Thickness: 0.1 cm.  No nodules visualized.

Lymphadenopathy

None visualized.
IMPRESSION: Negative for thyroid nodules.

## 2015-07-07 ENCOUNTER — Ambulatory Visit (INDEPENDENT_AMBULATORY_CARE_PROVIDER_SITE_OTHER): Payer: PPO | Admitting: Internal Medicine

## 2015-07-07 ENCOUNTER — Encounter: Payer: Self-pay | Admitting: Internal Medicine

## 2015-07-07 ENCOUNTER — Other Ambulatory Visit (INDEPENDENT_AMBULATORY_CARE_PROVIDER_SITE_OTHER): Payer: PPO

## 2015-07-07 VITALS — BP 122/68 | HR 51 | Temp 97.9°F | Resp 14 | Ht 66.5 in | Wt 154.0 lb

## 2015-07-07 DIAGNOSIS — E038 Other specified hypothyroidism: Secondary | ICD-10-CM

## 2015-07-07 DIAGNOSIS — E559 Vitamin D deficiency, unspecified: Secondary | ICD-10-CM | POA: Diagnosis not present

## 2015-07-07 DIAGNOSIS — E039 Hypothyroidism, unspecified: Secondary | ICD-10-CM

## 2015-07-07 DIAGNOSIS — I7300 Raynaud's syndrome without gangrene: Secondary | ICD-10-CM | POA: Diagnosis not present

## 2015-07-07 DIAGNOSIS — K5730 Diverticulosis of large intestine without perforation or abscess without bleeding: Secondary | ICD-10-CM

## 2015-07-07 DIAGNOSIS — E785 Hyperlipidemia, unspecified: Secondary | ICD-10-CM | POA: Diagnosis not present

## 2015-07-07 DIAGNOSIS — I10 Essential (primary) hypertension: Secondary | ICD-10-CM | POA: Diagnosis not present

## 2015-07-07 DIAGNOSIS — Z1211 Encounter for screening for malignant neoplasm of colon: Secondary | ICD-10-CM

## 2015-07-07 DIAGNOSIS — Z23 Encounter for immunization: Secondary | ICD-10-CM

## 2015-07-07 LAB — BASIC METABOLIC PANEL
BUN: 20 mg/dL (ref 6–23)
CHLORIDE: 104 meq/L (ref 96–112)
CO2: 28 meq/L (ref 19–32)
Calcium: 9.3 mg/dL (ref 8.4–10.5)
Creatinine, Ser: 0.89 mg/dL (ref 0.40–1.20)
GFR: 65.3 mL/min (ref >60.00)
GLUCOSE: 89 mg/dL (ref 70–99)
POTASSIUM: 4.5 meq/L (ref 3.5–5.1)
SODIUM: 139 meq/L (ref 135–145)

## 2015-07-07 LAB — CBC WITH DIFFERENTIAL/PLATELET
Basophils Absolute: 0.1 10*3/uL (ref 0.0–0.1)
Basophils Relative: 1.3 % (ref 0.0–3.0)
EOS PCT: 2.1 % (ref 0.0–5.0)
Eosinophils Absolute: 0.1 10*3/uL (ref 0.0–0.7)
HCT: 42.5 % (ref 36.0–46.0)
HEMOGLOBIN: 14.1 g/dL (ref 12.0–15.0)
LYMPHS ABS: 1.1 10*3/uL (ref 0.7–4.0)
Lymphocytes Relative: 24.4 % (ref 12.0–46.0)
MCHC: 33.2 g/dL (ref 30.0–36.0)
MCV: 94.2 fl (ref 78.0–100.0)
MONOS PCT: 11.2 % (ref 3.0–12.0)
Monocytes Absolute: 0.5 10*3/uL (ref 0.1–1.0)
NEUTROS PCT: 61 % (ref 43.0–77.0)
Neutro Abs: 2.7 10*3/uL (ref 1.4–7.7)
Platelets: 218 10*3/uL (ref 150.0–400.0)
RBC: 4.51 Mil/uL (ref 3.87–5.11)
RDW: 14 % (ref 11.5–15.5)
WBC: 4.4 10*3/uL (ref 4.0–10.5)

## 2015-07-07 LAB — HEPATIC FUNCTION PANEL
ALBUMIN: 4.2 g/dL (ref 3.5–5.2)
ALT: 12 U/L (ref 0–35)
AST: 16 U/L (ref 0–37)
Alkaline Phosphatase: 46 U/L (ref 39–117)
Bilirubin, Direct: 0.1 mg/dL (ref 0.0–0.3)
Total Bilirubin: 0.5 mg/dL (ref 0.2–1.2)
Total Protein: 7.3 g/dL (ref 6.0–8.3)

## 2015-07-07 LAB — VITAMIN D 25 HYDROXY (VIT D DEFICIENCY, FRACTURES): VITD: 94.64 ng/mL (ref 30.00–100.00)

## 2015-07-07 LAB — LIPID PANEL
CHOLESTEROL: 249 mg/dL — AB (ref 0–200)
HDL: 85.3 mg/dL (ref >39.00)
LDL Cholesterol: 143 mg/dL — ABNORMAL HIGH (ref 0–99)
NonHDL: 163.33
Total CHOL/HDL Ratio: 3
Triglycerides: 100 mg/dL (ref 0.0–149.0)
VLDL: 20 mg/dL (ref 0.0–40.0)

## 2015-07-07 LAB — TSH: TSH: 1.69 u[IU]/mL (ref 0.35–4.50)

## 2015-07-07 MED ORDER — LEVOTHYROXINE SODIUM 88 MCG PO TABS: ORAL_TABLET | ORAL | Status: DC

## 2015-07-07 MED ORDER — MELOXICAM 7.5 MG PO TABS: ORAL_TABLET | ORAL | Status: DC

## 2015-07-07 MED ORDER — LISINOPRIL 10 MG PO TABS: ORAL_TABLET | ORAL | Status: DC

## 2015-07-07 NOTE — Progress Notes (Signed)
   Subjective:    Patient ID: Tara Bridges, female    DOB: 29-Aug-1936, 79 y.o.   MRN: [id number]  HPI The patient is here to assess status of active health conditions.  PMH, FH, & Social History reviewed & updated.No change in [place] as recorded.  She is on a heart healthy diet. She walks at least 7000 steps 5 days a week without cardiopulmonary symptoms. Her blood pressure averages well below 140/90.  She did not have a colonoscopy in 2015 as recommended. She has no active GI symptoms.  She quit smoking in her 58s and did not smoke to any significant degree. She has less than 3 alcoholic beverages per night.  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 This responded to meloxicam. There was no associated radicular pain or numbness or tingling the lower extremities or  incontinence of urine or stool.  She has cold intolerance manifested as Raynaud's phenomena of hands.  Review of Systems  Chest pain, palpitations, tachycardia, exertional dyspnea, paroxysmal nocturnal dyspnea, claudication or edema are absent. No unexplained weight loss, abdominal pain, significant dyspepsia, dysphagia, melena, rectal bleeding, or persistently small caliber stools. Dysuria, pyuria, hematuria, frequency, nocturia or polyuria are denied. Change in hair, skin, nails denied. No bowel changes of constipation or diarrhea. No intolerance to heat or cold.     Objective:   Physical Exam Pertinent or positive findings include:Appears younger than stated age. Right thyroid is larger than left. No definite nodules present. Left cervical rib is suggested. Heart rate is slow. There is asymmetry of the thoracic musculature; right larger than left. She has minor crepitus the knees. She has a flexion contracture the fifth right digit.  General appearance :adequately nourished; in no distress.  Eyes: No conjunctival inflammation or scleral icterus is  present.  Oral exam:  Lips and gums are healthy appearing.There is no oropharyngeal erythema or exudate noted. Dental hygiene is good.  Heart:   regular rhythm. S1 and S2 normal without gallop, murmur, click, rub or other extra sounds    Lungs:Chest clear to auscultation; no wheezes, rhonchi,rales ,or rubs present.No increased work of breathing.   Abdomen: bowel sounds normal, soft and non-tender without masses, organomegaly or hernias noted.  No guarding or rebound. .  Vascular : all pulses equal ; no bruits present.  Skin:Warm & dry.  Intact without suspicious lesions or rashes ; no tenting or jaundice   Lymphatic: No lymphadenopathy is noted about the head, neck, axilla.   Neuro: Strength, tone & DTRs normal.Negative SLR.     Assessment & Plan:  See Current Assessment & Plan in Problem List under specific Diagnosis

## 2015-07-07 NOTE — Assessment & Plan Note (Signed)
Protect  hands, feet, and ears from cold exposure including ice chests. Use silk sock & mitten liners; this can be purchased at outdoor supply stores.

## 2015-07-07 NOTE — Patient Instructions (Addendum)
Minimal Blood Pressure Goal= AVERAGE < 140/90;  Ideal is an AVERAGE < 135/85. This AVERAGE should be calculated from @ least 5-7 BP readings taken @ different times of day on different days of week. You should not respond to isolated BP readings , but rather the AVERAGE for that week .Please bring your  blood pressure cuff to office visits to verify that it is reliable.It  can also be checked against the blood pressure device at the pharmacy. Finger or wrist cuffs are not dependable; an arm cuff is. Your next office appointment will be determined based upon review of your pending labs .Those written interpretation of the lab results and instructions will be transmitted to you by mail for your records.  The best exercises for the low back include freestyle swimming, stretch aerobics, and yoga.   Critical results will be called.   Followup as needed for any active or acute issue. Please report any significant change in your symptoms.

## 2015-07-07 NOTE — Progress Notes (Signed)
Pre visit review using our clinic review tool, if applicable. No additional management support is needed unless otherwise documented below in the visit note. 

## 2015-07-07 NOTE — Assessment & Plan Note (Signed)
Lipids, LFTs, TSH  

## 2015-07-07 NOTE — Addendum Note (Signed)
Addended by: Lowella Dandy on: 07/07/2015 11:00 AM   Modules accepted: Orders

## 2015-07-07 NOTE — Assessment & Plan Note (Signed)
CBC

## 2015-07-07 NOTE — Assessment & Plan Note (Signed)
Blood pressure goals reviewed. BMET 

## 2015-07-07 NOTE — Assessment & Plan Note (Addendum)
Vitamin D level 

## 2015-07-07 NOTE — Assessment & Plan Note (Signed)
TSH 

## 2015-10-07 ENCOUNTER — Ambulatory Visit (AMBULATORY_SURGERY_CENTER): Payer: Self-pay

## 2015-10-07 VITALS — Ht 67.0 in | Wt 155.2 lb

## 2015-10-07 DIAGNOSIS — Z1211 Encounter for screening for malignant neoplasm of colon: Secondary | ICD-10-CM

## 2015-10-07 NOTE — Progress Notes (Signed)
No allergies to eggs or soy No diet/weight loss meds No home oxygen No past problems with anesthesia  Has email and internet; refused emmi 

## 2015-10-20 ENCOUNTER — Telehealth: Payer: Self-pay | Admitting: Internal Medicine

## 2015-10-20 NOTE — Telephone Encounter (Signed)
Pt for colon tomorrow with Tara Bridges MiraLax prep + dulcolax.  Pt called because she drank 1st half of prep and took dulcolax as instructed and 2 hrs later still no BM.  Feels full, but no pain, n or vomiting.  Drank 3 additional 8oz glasses of water Does not have additional MiraLax or laxative at home.  Does not feel she can drive at night to pharmacy for Mg Citrate I instructed her to continue as directed and that hopefully the prep would begin to cause BMs soon. Call back with any further problems or questions Call office if no BM with 2nd half of prep.

## 2015-10-21 ENCOUNTER — Ambulatory Visit: Payer: PPO | Admitting: Internal Medicine

## 2015-10-21 ENCOUNTER — Encounter: Payer: Self-pay | Admitting: Internal Medicine

## 2015-10-21 VITALS — BP 130/83 | HR 47 | Temp 98.7°F | Ht 67.0 in | Wt 155.0 lb

## 2015-10-21 MED ORDER — SODIUM CHLORIDE 0.9 % IV SOLN
500.0000 mL | INTRAVENOUS | Status: DC
Start: 2015-10-21 — End: 2015-10-24

## 2015-10-21 NOTE — Progress Notes (Signed)
Pt reported muddy stool after colon prep, spoke with Dr Carlean Purl and he advised to cancel procedure due to the fact that we would be unable to see, advised pt, pt did not want to rescedule, she is 71 and didn't feel the need to do this again, advised Dr Carlean Purl about pt, He spoke with pt and offered to let her do cologuard or at her age it was ok to do nothing but that decision was up to her, pt will call office if she decides to go ahead with cologuard. No charge for today per [employer]

## 2015-12-26 ENCOUNTER — Telehealth: Payer: Self-pay

## 2015-12-26 NOTE — Telephone Encounter (Signed)
Patient called to educate on Medicare Wellness apt. LVM for the patient to call back to educate and schedule for wellness visit.  Not sure if she is establishing at this practice post dr. Clayborn Heron retirement

## 2016-01-26 ENCOUNTER — Telehealth: Payer: Self-pay

## 2016-01-26 NOTE — Telephone Encounter (Signed)
Call to discuss AWV; States she does plan to fup at [facility], Agreed to schedule New Patient visit with Billey Gosling for Nov 6th and will have AWV the next week on the 13th.

## 2016-03-16 DIAGNOSIS — H26492 Other secondary cataract, left eye: Secondary | ICD-10-CM | POA: Diagnosis not present

## 2016-03-16 DIAGNOSIS — H43813 Vitreous degeneration, bilateral: Secondary | ICD-10-CM | POA: Diagnosis not present

## 2016-03-16 DIAGNOSIS — Z961 Presence of intraocular lens: Secondary | ICD-10-CM | POA: Diagnosis not present

## 2016-03-16 DIAGNOSIS — H16423 Pannus (corneal), bilateral: Secondary | ICD-10-CM | POA: Diagnosis not present

## 2016-03-16 DIAGNOSIS — H353121 Nonexudative age-related macular degeneration, left eye, early dry stage: Secondary | ICD-10-CM | POA: Diagnosis not present

## 2016-04-26 DIAGNOSIS — Z01419 Encounter for gynecological examination (general) (routine) without abnormal findings: Secondary | ICD-10-CM | POA: Diagnosis not present

## 2016-04-26 DIAGNOSIS — N8189 Other female genital prolapse: Secondary | ICD-10-CM | POA: Diagnosis not present

## 2016-04-26 DIAGNOSIS — N3281 Overactive bladder: Secondary | ICD-10-CM | POA: Diagnosis not present

## 2016-04-26 DIAGNOSIS — Z780 Asymptomatic menopausal state: Secondary | ICD-10-CM | POA: Diagnosis not present

## 2016-06-28 DIAGNOSIS — Z1231 Encounter for screening mammogram for malignant neoplasm of breast: Secondary | ICD-10-CM | POA: Diagnosis not present

## 2016-07-10 NOTE — Assessment & Plan Note (Addendum)
Dexa up to date - done by gyn Taking calcium and vitamin d Exercising regularly

## 2016-07-10 NOTE — Progress Notes (Signed)
Subjective:    Patient ID: Tara Bridges, female    DOB: July 12, 1936, 80 y.o.   MRN: [id number]  HPI She is here to establish with a new pcp.  She is here for a physical exam.   She works two days a week and is on her feet a lot.   Lower back pain:  She has chronic lower back pain.  She has daily low grade pain.  It will be worse at times.  She thinks it is worse with working/standing on her feet a lot.  She takes the meloxicam only as needed and it helps.  She wonders if exercises will help.  She is exercising - does weights, walks.    She denies changes in her health since she was here last.    Medications and allergies reviewed with patient and updated if appropriate.  Patient Active Problem List   Diagnosis Date Noted  . Vitamin D deficiency 06/24/2013  . Osteopenia 06/05/2010  . NONSPECIFIC ABNORMAL ELECTROCARDIOGRAM 06/05/2010  . SKIN CANCER, HX OF 06/05/2010  . Diverticulosis of colon without hemorrhage 02/24/2009  . Hyperlipidemia 02/20/2008  . Essential hypertension 02/20/2008  . Hypothyroidism 02/06/2007  . Raynaud phenomenon 02/06/2007    Current Outpatient Prescriptions on File Prior to Visit  Medication Sig Dispense Refill  . Biotin 300 MCG TABS Take by mouth daily.      . Calcium Carbonate-Vit D-Min (CALCIUM 1200 PO) Take by mouth. Viactiv     . Cholecalciferol (VITAMIN D-3) 5000 UNITS TABS Take by mouth daily.      . cycloSPORINE (RESTASIS) 0.05 % ophthalmic emulsion Place 1 drop into both eyes 2 (two) times daily.      Marland Kitchen KRILL OIL PO Take 2 capsules by mouth daily.     Marland Kitchen levothyroxine (SYNTHROID) 88 MCG tablet TAKE 1 TABLET BY MOUTH, EXCEPT TAKE 1/2 TABLET DAILY ONDAYS TUES., THURS., AND SATURDAY. 90 tablet 3  . lisinopril (PRINIVIL,ZESTRIL) 10 MG tablet TAKE ONE TABLET BY MOUTH ONE TIME DAILY 90 tablet 3  . magnesium chloride (SLOW-MAG) 64 MG TBEC Take 1 tablet by mouth daily.      . meloxicam (MOBIC) 7.5 MG tablet 1 qd -bid prn 30 tablet 0  . Multiple  Vitamin (MULTIVITAMIN) tablet Take 1 tablet by mouth daily.      . NON FORMULARY Take 1 capsule by mouth daily. Target Brand Probiotic    . PREMARIN vaginal cream USE 2 GRAMS VAGINALLY EVERY NIGHT AT BEDTIME 1 -2 TIMES PER WEEK  6  . Psyllium (METAMUCIL PO) Take by mouth daily.       No current facility-administered medications on file prior to visit.     Past Medical History:  Diagnosis Date  . Diverticulosis of colon 2005   Dr Carlean Purl  . Fracture of ankle, closed 2012   boot, no surgery  . Hyperlipidemia   . Hypertension   . Osteopenia   . Raynaud's phenomenon   . Scoliosis   . Skin cancer    X 2  . Thyroid disease    hypothyroidism    Past Surgical History:  Procedure Laterality Date  . CATARACT EXTRACTION     bilaterally, Dr Katy Fitch  . COLONOSCOPY  2005   Dr Carlean Purl  . DILATION AND CURETTAGE OF UTERUS    . HYSTEROSCOPY     Dr Radene Knee  . I&D EXTREMITY  2004   infection L hand  . MOHS SURGERY  2011   L temple  . MOHS SURGERY  2014   R cheek  . TONSILLECTOMY    . uterine polyp      Social History   Social History  . Marital status: Single    Spouse name: N/A  . Number of children: N/A  . Years of education: N/A   Social History Main Topics  . Smoking status: Former Smoker    Quit date: 09/06/1982  . Smokeless tobacco: Never Used     Comment: Smoked [phone], up to < 1/2 ppd  . Alcohol use 12.6 oz/week    21 Glasses of wine per week  . Drug use: No  . Sexual activity: Not Asked   Other Topics Concern  . None   Social History Narrative  . None    Family History  Problem Relation Age of Onset  . Cancer Mother     stomach  . Alzheimer's disease Father   . Cancer Brother     leukemia  . Alcohol abuse Sister   . Diabetes Neg Hx   . Heart disease Neg Hx   . Stroke Neg Hx   . Colon cancer Neg Hx   . Alzheimer's disease Paternal Aunt     X 2  . Alzheimer's disease Paternal Grandmother     Review of Systems  Constitutional: Negative for chills  and fever.  Eyes: Negative for visual disturbance.  Respiratory: Negative for cough, shortness of breath and wheezing.   Cardiovascular: Negative for chest pain, palpitations and leg swelling.  Gastrointestinal: Negative for abdominal pain, blood in stool, constipation, diarrhea and nausea.       No gerd  Genitourinary: Negative for dysuria and hematuria.  Musculoskeletal: Positive for back pain (chronic, lower back pain). Negative for arthralgias.  Skin: Negative for rash.  Neurological: Positive for headaches (occ).  Psychiatric/Behavioral: Negative for dysphoric mood. The patient is not nervous/anxious.        Objective:   Vitals:   07/12/16 0909  BP: 134/78  Pulse: 60  Resp: 16  Temp: 98.1 F (36.7 C)   Filed Weights   07/12/16 0909  Weight: 154 lb (69.9 kg)   Body mass index is 24.12 kg/m.   Physical Exam Constitutional: She appears well-developed and well-nourished. No distress.  HENT:  Head: Normocephalic and atraumatic.  Right Ear: External ear normal. Normal ear canal and TM Left Ear: External ear normal.  Normal ear canal and TM Mouth/Throat: Oropharynx is clear and moist.  Eyes: Conjunctivae and EOM are normal.  Neck: Neck supple. No tracheal deviation present. No thyromegaly present.  No carotid bruit  Cardiovascular: Normal rate, regular rhythm and normal heart sounds.   No murmur heard.  No edema. Pulmonary/Chest: Effort normal and breath sounds normal. No respiratory distress. She has no wheezes. She has no rales.  Breast: deferred to Gyn Abdominal: Soft. She exhibits no distension. There is no tenderness.  Lymphadenopathy: She has no cervical adenopathy.  Skin: Skin is warm and dry. She is not diaphoretic.  Psychiatric: She has a normal mood and affect. Her behavior is normal.        Assessment & Plan:   Physical exam: Screening blood work ordered Immunizations - flu vaccine today, discussed tetanus, shingles and PNA vaccines Colonoscopy - no  longer needed at her age 61 - no longer needed at her age [place]  Up to date  Dexa  Up to date  Eye exams  - Up to date  EKG last done 2015 Exercise - regularly Weight - normal BMI Skin   H/o  skin cancer - sees derm annually Substance abuse - none  See Problem List for Assessment and Plan of chronic medical problems.

## 2016-07-10 NOTE — Patient Instructions (Addendum)
Consider having a pneumonia vaccine, shingles and tetanus vaccine.   Test(s) ordered today. Your results will be released to [place] (or called to you) after review, usually within 72hours after test completion. If any changes need to be made, you will be notified at that same time.  All other Health Maintenance issues reviewed.   All recommended immunizations and age-appropriate screenings are up-to-date or discussed.  Flu immunization administered today.   Medications reviewed and updated.  No changes recommended at this time.  Your prescription(s) have been submitted to your pharmacy. Please take as directed and contact our office if you believe you are having problem(s) with the medication(s).  Please followup in one year for a physical    Back Exercises The following exercises strengthen the muscles that help to support the back. They also help to keep the lower back flexible. Doing these exercises can help to prevent back pain or lessen existing pain. If you have back pain or discomfort, try doing these exercises 2-3 times each day or as told by your health care provider. When the pain goes away, do them once each day, but increase the number of times that you repeat the steps for each exercise (do more repetitions). If you do not have back pain or discomfort, do these exercises once each day or as told by your health care provider. EXERCISES Single Knee to Chest Repeat these steps 3-5 times for each leg: 1. Lie on your back on a firm bed or the floor with your legs extended. 2. Bring one knee to your chest. Your other leg should stay extended and in contact with the floor. 3. Hold your knee in place by grabbing your knee or thigh. 4. Pull on your knee until you feel a gentle stretch in your lower back. 5. Hold the stretch for 10-30 seconds. 6. Slowly release and straighten your leg. Pelvic Tilt Repeat these steps 5-10 times: 1. Lie on your back on a firm bed or the floor with  your legs extended. 2. Bend your knees so they are pointing toward the ceiling and your feet are flat on the floor. 3. Tighten your lower abdominal muscles to press your lower back against the floor. This motion will tilt your pelvis so your tailbone points up toward the ceiling instead of pointing to your feet or the floor. 4. With gentle tension and even breathing, hold this position for 5-10 seconds. Cat-Cow Repeat these steps until your lower back becomes more flexible: 1. Get into a hands-and-knees position on a firm surface. Keep your hands under your shoulders, and keep your knees under your hips. You may place padding under your knees for comfort. 2. Let your head hang down, and point your tailbone toward the floor so your lower back becomes rounded like the back of a cat. 3. Hold this position for 5 seconds. 4. Slowly lift your head and point your tailbone up toward the ceiling so your back forms a sagging arch like the back of a cow. 5. Hold this position for 5 seconds. Press-Ups Repeat these steps 5-10 times: 1. Lie on your abdomen (face-down) on the floor. 2. Place your palms near your head, about shoulder-width apart. 3. While you keep your back as relaxed as possible and keep your hips on the floor, slowly straighten your arms to raise the top half of your body and lift your shoulders. Do not use your back muscles to raise your upper torso. You may adjust the placement of your hands  to make yourself more comfortable. 4. Hold this position for 5 seconds while you keep your back relaxed. 5. Slowly return to lying flat on the floor. Bridges Repeat these steps 10 times: 1. Lie on your back on a firm surface. 2. Bend your knees so they are pointing toward the ceiling and your feet are flat on the floor. 3. Tighten your buttocks muscles and lift your buttocks off of the floor until your waist is at almost the same height as your knees. You should feel the muscles working in your  buttocks and the back of your thighs. If you do not feel these muscles, slide your feet 1-2 inches farther away from your buttocks. 4. Hold this position for 3-5 seconds. 5. Slowly lower your hips to the starting position, and allow your buttocks muscles to relax completely. If this exercise is too easy, try doing it with your arms crossed over your chest. Abdominal Crunches Repeat these steps 5-10 times: 1. Lie on your back on a firm bed or the floor with your legs extended. 2. Bend your knees so they are pointing toward the ceiling and your feet are flat on the floor. 3. Cross your arms over your chest. 4. Tip your chin slightly toward your chest without bending your neck. 5. Tighten your abdominal muscles and slowly raise your trunk (torso) high enough to lift your shoulder blades a tiny bit off of the floor. Avoid raising your torso higher than that, because it can put too much stress on your low back and it does not help to strengthen your abdominal muscles. 6. Slowly return to your starting position. Back Lifts Repeat these steps 5-10 times: 1. Lie on your abdomen (face-down) with your arms at your sides, and rest your forehead on the floor. 2. Tighten the muscles in your legs and your buttocks. 3. Slowly lift your chest off of the floor while you keep your hips pressed to the floor. Keep the back of your head in line with the curve in your back. Your eyes should be looking at the floor. 4. Hold this position for 3-5 seconds. 5. Slowly return to your starting position. SEEK MEDICAL CARE IF:  Your back pain or discomfort gets much worse when you do an exercise.  Your back pain or discomfort does not lessen within 2 hours after you exercise. If you have any of these problems, stop doing these exercises right away. Do not do them again unless your health care provider says that you can. SEEK IMMEDIATE MEDICAL CARE IF:  You develop sudden, severe back pain. If this happens, stop doing  the exercises right away. Do not do them again unless your health care provider says that you can.   This information is not intended to replace advice given to you by your health care provider. Make sure you discuss any questions you have with your health care provider.   Document Released: 09/30/2004 Document Revised: 05/14/2015 Document Reviewed: 10/17/2014 Elsevier Interactive Patient Education 2016 [place] Maintenance, Female Adopting a healthy lifestyle and getting preventive care can go a long way to promote health and wellness. Talk with your health care provider about what schedule of regular examinations is right for you. This is a good chance for you to check in with your provider about disease prevention and staying healthy. In between checkups, there are plenty of things you can do on your own. Experts have done a lot of research about which lifestyle changes and  preventive measures are most likely to keep you healthy. Ask your health care provider for more information. WEIGHT AND DIET  Eat a healthy diet  Be sure to include plenty of vegetables, fruits, low-fat dairy products, and lean protein.  Do not eat a lot of foods high in solid fats, added sugars, or salt.  Get regular exercise. This is one of the most important things you can do for your health.  Most adults should exercise for at least 150 minutes each week. The exercise should increase your heart rate and make you sweat (moderate-intensity exercise).  Most adults should also do strengthening exercises at least twice a week. This is in addition to the moderate-intensity exercise.  Maintain a healthy weight  Body mass index (BMI) is a measurement that can be used to identify possible weight problems. It estimates body fat based on height and weight. Your health care provider can help determine your BMI and help you achieve or maintain a healthy weight.  For females 51 years of age and older:   A  BMI below 18.5 is considered underweight.  A BMI of 18.5 to 24.9 is normal.  A BMI of 25 to 29.9 is considered overweight.  A BMI of 30 and above is considered obese.  Watch levels of cholesterol and blood lipids  You should start having your blood tested for lipids and cholesterol at 78 years of age, then have this test every 5 years.  You may need to have your cholesterol levels checked more often if:  Your lipid or cholesterol levels are high.  You are older than 78 years of age.  You are at high risk for heart disease.  CANCER SCREENING   Lung Cancer  Lung cancer screening is recommended for adults 52-50 years old who are at high risk for lung cancer because of a history of smoking.  A yearly low-dose CT scan of the lungs is recommended for people who:  Currently smoke.  Have quit within the past 15 years.  Have at least a 30-pack-year history of smoking. A pack year is smoking an average of one pack of cigarettes a day for 1 year.  Yearly screening should continue until it has been 15 years since you quit.  Yearly screening should stop if you develop a health problem that would prevent you from having lung cancer treatment.  Breast Cancer  Practice breast self-awareness. This means understanding how your breasts normally appear and feel.  It also means doing regular breast self-exams. Let your health care provider know about any changes, no matter how small.  If you are in your 20s or 30s, you should have a clinical breast exam (CBE) by a health care provider every 1-3 years as part of a regular health exam.  If you are 82 or older, have a CBE every year. Also consider having a breast X-ray (mammogram) every year.  If you have a family history of breast cancer, talk to your health care provider about genetic screening.  If you are at high risk for breast cancer, talk to your health care provider about having an MRI and a mammogram every year.  Breast cancer  gene (BRCA) assessment is recommended for women who have family members with BRCA-related cancers. BRCA-related cancers include:  Breast.  Ovarian.  Tubal.  Peritoneal cancers.  Results of the assessment will determine the need for genetic counseling and BRCA1 and BRCA2 testing. Cervical Cancer Your health care provider may recommend that you be screened regularly  for cancer of the pelvic organs (ovaries, uterus, and vagina). This screening involves a pelvic examination, including checking for microscopic changes to the surface of your cervix (Pap test). You may be encouraged to have this screening done every 3 years, beginning at age 6.  For women ages 82-65, health care providers may recommend pelvic exams and Pap testing every 3 years, or they may recommend the Pap and pelvic exam, combined with testing for human papilloma virus (HPV), every 5 years. Some types of HPV increase your risk of cervical cancer. Testing for HPV may also be done on women of any age with unclear Pap test results.  Other health care providers may not recommend any screening for nonpregnant women who are considered low risk for pelvic cancer and who do not have symptoms. Ask your health care provider if a screening pelvic exam is right for you.  If you have had past treatment for cervical cancer or a condition that could lead to cancer, you need Pap tests and screening for cancer for at least 20 years after your treatment. If Pap tests have been discontinued, your risk factors (such as having a new sexual partner) need to be reassessed to determine if screening should resume. Some women have medical problems that increase the chance of getting cervical cancer. In these cases, your health care provider may recommend more frequent screening and Pap tests. Colorectal Cancer  This type of cancer can be detected and often prevented.  Routine colorectal cancer screening usually begins at 78 years of age and continues  through 78 years of age.  Your health care provider may recommend screening at an earlier age if you have risk factors for colon cancer.  Your health care provider may also recommend using home test kits to check for hidden blood in the stool.  A small camera at the end of a tube can be used to examine your colon directly (sigmoidoscopy or colonoscopy). This is done to check for the earliest forms of colorectal cancer.  Routine screening usually begins at age 23.  Direct examination of the colon should be repeated every 5-10 years through 78 years of age. However, you may need to be screened more often if early forms of precancerous polyps or small growths are found. Skin Cancer  Check your skin from head to toe regularly.  Tell your health care provider about any new moles or changes in moles, especially if there is a change in a mole's shape or color.  Also tell your health care provider if you have a mole that is larger than the size of a pencil eraser.  Always use sunscreen. Apply sunscreen liberally and repeatedly throughout the day.  Protect yourself by wearing long sleeves, pants, a wide-brimmed hat, and sunglasses whenever you are outside. HEART DISEASE, DIABETES, AND HIGH BLOOD PRESSURE   High blood pressure causes heart disease and increases the risk of stroke. High blood pressure is more likely to develop in:  People who have blood pressure in the high end of the normal range (130-139/85-89 mm Hg).  People who are overweight or obese.  People who are African American.  If you are 75-32 years of age, have your blood pressure checked every 3-5 years. If you are 70 years of age or older, have your blood pressure checked every year. You should have your blood pressure measured twice--once when you are at a hospital or clinic, and once when you are not at a hospital or clinic. Record the average  of the two measurements. To check your blood pressure when you are not at a hospital  or clinic, you can use:  An automated blood pressure machine at a pharmacy.  A home blood pressure monitor.  If you are between 67 years and 25 years old, ask your health care provider if you should take aspirin to prevent strokes.  Have regular diabetes screenings. This involves taking a blood sample to check your fasting blood sugar level.  If you are at a normal weight and have a low risk for diabetes, have this test once every three years after 78 years of age.  If you are overweight and have a high risk for diabetes, consider being tested at a younger age or more often. PREVENTING INFECTION  Hepatitis B  If you have a higher risk for hepatitis B, you should be screened for this virus. You are considered at high risk for hepatitis B if:  You were born in a country where hepatitis B is common. Ask your health care provider which countries are considered high risk.  Your parents were born in a high-risk country, and you have not been immunized against hepatitis B (hepatitis B vaccine).  You have HIV or AIDS.  You use needles to inject street drugs.  You live with someone who has hepatitis B.  You have had sex with someone who has hepatitis B.  You get hemodialysis treatment.  You take certain medicines for conditions, including cancer, organ transplantation, and autoimmune conditions. Hepatitis C  Blood testing is recommended for:  Everyone born from [birth year] through 1963.  Anyone with known risk factors for hepatitis C. Sexually transmitted infections (STIs)  You should be screened for sexually transmitted infections (STIs) including gonorrhea and chlamydia if:  You are sexually active and are younger than 78 years of age.  You are older than 78 years of age and your health care provider tells you that you are at risk for this type of infection.  Your sexual activity has changed since you were last screened and you are at an increased risk for chlamydia or gonorrhea.  Ask your health care provider if you are at risk.  If you do not have HIV, but are at risk, it may be recommended that you take a prescription medicine daily to prevent HIV infection. This is called pre-exposure prophylaxis (PrEP). You are considered at risk if:  You are sexually active and do not regularly use condoms or know the HIV status of your partner(s).  You take drugs by injection.  You are sexually active with a partner who has HIV. Talk with your health care provider about whether you are at high risk of being infected with HIV. If you choose to begin PrEP, you should first be tested for HIV. You should then be tested every 3 months for as long as you are taking PrEP.  PREGNANCY   If you are premenopausal and you may become pregnant, ask your health care provider about preconception counseling.  If you may become pregnant, take 400 to 800 micrograms (mcg) of folic acid every day.  If you want to prevent pregnancy, talk to your health care provider about birth control (contraception). OSTEOPOROSIS AND MENOPAUSE   Osteoporosis is a disease in which the bones lose minerals and strength with aging. This can result in serious bone fractures. Your risk for osteoporosis can be identified using a bone density scan.  If you are 48 years of age or older, or if you are  at risk for osteoporosis and fractures, ask your health care provider if you should be screened.  Ask your health care provider whether you should take a calcium or vitamin D supplement to lower your risk for osteoporosis.  Menopause may have certain physical symptoms and risks.  Hormone replacement therapy may reduce some of these symptoms and risks. Talk to your health care provider about whether hormone replacement therapy is right for you.  HOME CARE INSTRUCTIONS   Schedule regular health, dental, and eye exams.  Stay current with your immunizations.   Do not use any tobacco products including cigarettes,  chewing tobacco, or electronic cigarettes.  If you are pregnant, do not drink alcohol.  If you are breastfeeding, limit how much and how often you drink alcohol.  Limit alcohol intake to no more than 1 drink per day for nonpregnant women. One drink equals 12 ounces of beer, 5 ounces of wine, or 1 ounces of hard liquor.  Do not use street drugs.  Do not share needles.  Ask your health care provider for help if you need support or information about quitting drugs.  Tell your health care provider if you often feel depressed.  Tell your health care provider if you have ever been abused or do not feel safe at home.   This information is not intended to replace advice given to you by your health care provider. Make sure you discuss any questions you have with your health care provider.   Document Released: 03/08/2011 Document Revised: 09/13/2014 Document Reviewed: 07/25/2013 Elsevier Interactive Patient Education [employer].

## 2016-07-10 NOTE — Assessment & Plan Note (Signed)
Check tsh  Titrate med dose if needed  

## 2016-07-12 ENCOUNTER — Other Ambulatory Visit (INDEPENDENT_AMBULATORY_CARE_PROVIDER_SITE_OTHER): Payer: PPO

## 2016-07-12 ENCOUNTER — Encounter: Payer: Self-pay | Admitting: Internal Medicine

## 2016-07-12 ENCOUNTER — Ambulatory Visit (INDEPENDENT_AMBULATORY_CARE_PROVIDER_SITE_OTHER): Payer: PPO | Admitting: Internal Medicine

## 2016-07-12 VITALS — BP 134/78 | HR 60 | Temp 98.1°F | Resp 16 | Ht 67.0 in | Wt 154.0 lb

## 2016-07-12 DIAGNOSIS — E038 Other specified hypothyroidism: Secondary | ICD-10-CM

## 2016-07-12 DIAGNOSIS — Z23 Encounter for immunization: Secondary | ICD-10-CM

## 2016-07-12 DIAGNOSIS — I10 Essential (primary) hypertension: Secondary | ICD-10-CM

## 2016-07-12 DIAGNOSIS — Z0000 Encounter for general adult medical examination without abnormal findings: Secondary | ICD-10-CM | POA: Diagnosis not present

## 2016-07-12 DIAGNOSIS — Z85828 Personal history of other malignant neoplasm of skin: Secondary | ICD-10-CM

## 2016-07-12 DIAGNOSIS — M85861 Other specified disorders of bone density and structure, right lower leg: Secondary | ICD-10-CM

## 2016-07-12 DIAGNOSIS — E7800 Pure hypercholesterolemia, unspecified: Secondary | ICD-10-CM | POA: Diagnosis not present

## 2016-07-12 DIAGNOSIS — E039 Hypothyroidism, unspecified: Secondary | ICD-10-CM

## 2016-07-12 DIAGNOSIS — E559 Vitamin D deficiency, unspecified: Secondary | ICD-10-CM

## 2016-07-12 LAB — COMPREHENSIVE METABOLIC PANEL
ALK PHOS: 48 U/L (ref 39–117)
ALT: 13 U/L (ref 0–35)
AST: 16 U/L (ref 0–37)
Albumin: 4.3 g/dL (ref 3.5–5.2)
BILIRUBIN TOTAL: 0.5 mg/dL (ref 0.2–1.2)
BUN: 23 mg/dL (ref 6–23)
CO2: 28 meq/L (ref 19–32)
CREATININE: 0.98 mg/dL (ref 0.40–1.20)
Calcium: 9.8 mg/dL (ref 8.4–10.5)
Chloride: 103 mEq/L (ref 96–112)
GFR: 58.28 mL/min — AB (ref >60.00)
GLUCOSE: 93 mg/dL (ref 70–99)
Potassium: 4.9 mEq/L (ref 3.5–5.1)
Sodium: 139 mEq/L (ref 135–145)
TOTAL PROTEIN: 7 g/dL (ref 6.0–8.3)

## 2016-07-12 LAB — TSH: TSH: 1.68 u[IU]/mL (ref 0.35–4.50)

## 2016-07-12 LAB — CBC WITH DIFFERENTIAL/PLATELET
BASOS ABS: 0 10*3/uL (ref 0.0–0.1)
Basophils Relative: 0.9 % (ref 0.0–3.0)
EOS ABS: 0.1 10*3/uL (ref 0.0–0.7)
Eosinophils Relative: 2.1 % (ref 0.0–5.0)
HCT: 41.9 % (ref 36.0–46.0)
Hemoglobin: 14.1 g/dL (ref 12.0–15.0)
LYMPHS ABS: 1.7 10*3/uL (ref 0.7–4.0)
LYMPHS PCT: 35.6 % (ref 12.0–46.0)
MCHC: 33.8 g/dL (ref 30.0–36.0)
MCV: 92.2 fl (ref 78.0–100.0)
MONOS PCT: 8.9 % (ref 3.0–12.0)
Monocytes Absolute: 0.4 10*3/uL (ref 0.1–1.0)
NEUTROS ABS: 2.5 10*3/uL (ref 1.4–7.7)
NEUTROS PCT: 52.5 % (ref 43.0–77.0)
PLATELETS: 234 10*3/uL (ref 150.0–400.0)
RBC: 4.54 Mil/uL (ref 3.87–5.11)
RDW: 13.7 % (ref 11.5–15.5)
WBC: 4.9 10*3/uL (ref 4.0–10.5)

## 2016-07-12 LAB — LIPID PANEL
CHOL/HDL RATIO: 3
Cholesterol: 263 mg/dL — ABNORMAL HIGH (ref 0–200)
HDL: 85 mg/dL (ref >39.00)
LDL Cholesterol: 159 mg/dL — ABNORMAL HIGH (ref 0–99)
NONHDL: 177.66
Triglycerides: 94 mg/dL (ref 0.0–149.0)
VLDL: 18.8 mg/dL (ref 0.0–40.0)

## 2016-07-12 MED ORDER — SYNTHROID 88 MCG PO TABS: ORAL_TABLET | ORAL | 3 refills | Status: DC

## 2016-07-12 MED ORDER — LISINOPRIL 10 MG PO TABS: ORAL_TABLET | ORAL | 3 refills | Status: DC

## 2016-07-12 MED ORDER — MELOXICAM 7.5 MG PO TABS: ORAL_TABLET | ORAL | 0 refills | Status: DC

## 2016-07-12 NOTE — Assessment & Plan Note (Signed)
Taking vitamin d daily 

## 2016-07-12 NOTE — Assessment & Plan Note (Signed)
BP well controlled Current regimen effective and well tolerated Continue current medications at current doses cmp  

## 2016-07-12 NOTE — Progress Notes (Signed)
Pre visit review using our clinic review tool, if applicable. No additional management support is needed unless otherwise documented below in the visit note. 

## 2016-07-12 NOTE — Assessment & Plan Note (Addendum)
Was on lipitor - no side effects, stopped due to concerns over possible side effects Check lipid panel, cmp

## 2016-07-12 NOTE — Assessment & Plan Note (Signed)
Sees dermatology.

## 2016-07-19 ENCOUNTER — Ambulatory Visit: Payer: PPO

## 2016-07-19 DIAGNOSIS — D2271 Melanocytic nevi of right lower limb, including hip: Secondary | ICD-10-CM | POA: Diagnosis not present

## 2016-07-19 DIAGNOSIS — L720 Epidermal cyst: Secondary | ICD-10-CM | POA: Diagnosis not present

## 2016-07-19 DIAGNOSIS — Z85828 Personal history of other malignant neoplasm of skin: Secondary | ICD-10-CM | POA: Diagnosis not present

## 2016-07-19 DIAGNOSIS — L821 Other seborrheic keratosis: Secondary | ICD-10-CM | POA: Diagnosis not present

## 2016-07-19 DIAGNOSIS — L812 Freckles: Secondary | ICD-10-CM | POA: Diagnosis not present

## 2016-07-19 DIAGNOSIS — D2272 Melanocytic nevi of left lower limb, including hip: Secondary | ICD-10-CM | POA: Diagnosis not present

## 2016-07-19 DIAGNOSIS — D225 Melanocytic nevi of trunk: Secondary | ICD-10-CM | POA: Diagnosis not present

## 2016-07-20 ENCOUNTER — Ambulatory Visit (INDEPENDENT_AMBULATORY_CARE_PROVIDER_SITE_OTHER): Payer: Self-pay

## 2016-07-20 VITALS — BP 128/70 | HR 50 | Ht 67.0 in | Wt 155.4 lb

## 2016-07-20 DIAGNOSIS — Z0000 Encounter for general adult medical examination without abnormal findings: Secondary | ICD-10-CM

## 2016-07-20 NOTE — Progress Notes (Addendum)
Subjective:   Tara Bridges is a 80 y.o. female who presents for Medicare Annual (Subsequent) preventive examination.  The Patient was informed that the wellness visit is to identify future health risk and educate and initiate measures that can reduce risk for increased disease through the lifespan.   Describes health as fair, good or great? "great"  Review of Systems:  No ROS.  Medicare Wellness Visit.  Cardiac Risk Factors include: dyslipidemia;hypertension;advanced age (>17men, >23 women)   Sleep patterns: Sleeps 6-7 hours.   Home Safety/Smoke Alarms:  Smoke detectors in place.  Living environment; residence and Firearm Safety: Lives alone with dog in 2 story home. No issues with stairs, uses rail. Feels safe in home.  No firearms.  Seat Belt Safety/Bike Helmet: wears seatbelt   Counseling:   Eye Exam-Last exam 05/2016, will make appt for contact Rx. Followed by Dr. Valetta Mole exam within 6 months, followed every 6 mths by Malcolm Metro  Female:   Pap-04/21/15, normal.      Mammo- Last 06/2016; dense breast. No longer needed per OV 07/12/16     Dexa scan-05/06/2015, osteopenia.  Takes Vit D and Calcium supplements.  CCS-colonoscopy 09/30/2003, diverticulosis. No longer needed per OV 07/12/16.      Objective:     Vitals: BP 128/70 (BP Location: Left Arm, Patient Position: Sitting, Cuff Size: Normal)   Pulse (!) 50   Ht 5\' 7"  (1.702 m)   Wt 155 lb 6.4 oz (70.5 kg)   SpO2 98%   BMI 24.34 kg/m   Body mass index is 24.34 kg/m.   Tobacco History  Smoking Status  . Former Smoker  . Quit date: 09/06/1982  Smokeless Tobacco  . Never Used    Comment: Smoked [phone], up to < 1/2 ppd     Counseling given: Not Answered   Past Medical History:  Diagnosis Date  . Diverticulosis of colon 2005   Dr Carlean Purl  . Fracture of ankle, closed 2012   boot, no surgery  . Hyperlipidemia   . Hypertension   . Osteopenia   . Raynaud's phenomenon   . Scoliosis   .  Skin cancer    X 2  . Thyroid disease    hypothyroidism   Past Surgical History:  Procedure Laterality Date  . CATARACT EXTRACTION     bilaterally, Dr Katy Fitch  . COLONOSCOPY  2005   Dr Carlean Purl  . DILATION AND CURETTAGE OF UTERUS    . HYSTEROSCOPY     Dr Radene Knee  . I&D EXTREMITY  2004   infection L hand  . MOHS SURGERY  2011   L temple  . MOHS SURGERY  2014   R cheek  . TONSILLECTOMY    . uterine polyp     Family History  Problem Relation Age of Onset  . Cancer Mother     stomach  . Alzheimer's disease Father   . Cancer Brother     leukemia  . Alcohol abuse Sister   . Alzheimer's disease Paternal Aunt     X 2  . Alzheimer's disease Paternal Grandmother   . Diabetes Neg Hx   . Heart disease Neg Hx   . Stroke Neg Hx   . Colon cancer Neg Hx    History  Sexual Activity  . Sexual activity: Not on file    Outpatient Encounter Prescriptions as of 07/20/2016  Medication Sig  . Biotin 300 MCG TABS Take by mouth daily.    . Calcium Carbonate-Vit D-Min (CALCIUM  1200 PO) Take by mouth. Viactiv   . Cholecalciferol (VITAMIN D-3) 5000 UNITS TABS Take by mouth daily.    . cycloSPORINE (RESTASIS) 0.05 % ophthalmic emulsion Place 1 drop into both eyes 2 (two) times daily.    Marland Kitchen KRILL OIL PO Take 2 capsules by mouth daily.   Marland Kitchen lisinopril (PRINIVIL,ZESTRIL) 10 MG tablet TAKE ONE TABLET BY MOUTH ONE TIME DAILY  . magnesium chloride (SLOW-MAG) 64 MG TBEC Take 1 tablet by mouth daily.    . meloxicam (MOBIC) 7.5 MG tablet 1 qd -bid prn  . Multiple Vitamin (MULTIVITAMIN) tablet Take 1 tablet by mouth daily.    . NON FORMULARY Take 1 capsule by mouth daily. Target Brand Probiotic  . NON FORMULARY Place 1 spray under the tongue 2 (two) times daily.  Marland Kitchen PREMARIN vaginal cream USE 2 GRAMS VAGINALLY EVERY NIGHT AT BEDTIME 1 -2 TIMES PER WEEK  . Psyllium (METAMUCIL PO) Take by mouth daily.    Marland Kitchen SYNTHROID 88 MCG tablet TAKE 1 TABLET BY MOUTH, EXCEPT TAKE 1/2 TABLET DAILY MONDAYS TUES., THURS.,  AND SATURDAY.   No facility-administered encounter medications on file as of 07/20/2016.    Patient plans to take natural herb/supplement for cholesterol.   Activities of Daily Living In your present state of health, do you have any difficulty performing the following activities: 07/20/2016  Hearing? N  Vision? N  Difficulty concentrating or making decisions? N  Walking or climbing stairs? N  Dressing or bathing? N  Doing errands, shopping? N  Preparing Food and eating ? N  Using the Toilet? N  In the past six months, have you accidently leaked urine? N  Do you have problems with loss of bowel control? N  Managing your Medications? N  Managing your Finances? N  Housekeeping or managing your Housekeeping? N  Some recent data might be hidden    Patient Care Team: Binnie Rail, MD as PCP - General (Internal Medicine) Clent Jacks, MD as Consulting Physician (Ophthalmology) Dr. Randol Kern (Dentistry) Arvella Nigh, MD as Consulting Physician (Obstetrics and Gynecology)    Assessment:   Physical assessment deferred to PCP.  Exercise Activities and Dietary recommendations Current Exercise Habits: Home exercise routine (finally fit (trainer)once/week; walks 10K steps/day), Type of exercise: walking;strength training/weights, Time (Minutes): 30, Frequency (Times/Week): 1, Weekly Exercise (Minutes/Week): 30, Intensity: Moderate, Exercise limited by: orthopedic condition(s)   Diet (meal preparation, eat out, water intake, caffeinated beverages, dairy products, fruits and vegetables): Prepares own meals (3/day) Drinks water and wine.   Breakfast: low fat yogurt, fruit, ezekiel/natural breads, butter,  coffee (1 cup) Lunch: salad, grilled chicken  Dinner: popcorn, nuts, lean meat, vegetables x 2, shrimp, wine (3 glasses)  Encourage pt to continue heart healthy diet and exercise routine.   Goals      Patient Stated   . <enter goal here> (pt-stated)          To maintain my current  health and activity.       Fall Risk Fall Risk  07/20/2016 07/12/2016 06/18/2013  Falls in the past year? No No No   Depression Screen PHQ 2/9 Scores 07/20/2016 07/12/2016 06/18/2013  PHQ - 2 Score 0 0 0     Cognitive Function       Ad8 score reviewed for issues:  Issues making decisions:no  Less interest in hobbies / activities:no  Repeats questions, stories (family complaining):no  Trouble using ordinary gadgets (microwave, computer, phone):no  Forgets the month or year: no  Mismanaging finances: no  Remembering appts: no  Daily problems with thinking and/or memory:no Ad8 score is=0     Immunization History  Administered Date(s) Administered  . Influenza Split 06/08/2011, 06/12/2012  . Influenza Whole 06/05/2010  . Influenza, High Dose Seasonal PF 06/18/2013, 07/07/2015, 07/12/2016  . Influenza,inj,Quad PF,36+ Mos 07/01/2014   Screening Tests Health Maintenance  Topic Date Due  . TETANUS/TDAP  02/18/1955  . ZOSTAVAX  02/18/1998  . PNA vac Low Risk Adult (1 of 2 - PCV13) 02/19/2003  . INFLUENZA VACCINE  Completed  . DEXA SCAN  Completed   Patient request to come in for nurse visit next month for Pneumonia Vaccine.     Plan:     Continue to eat heart healthy diet (full of fruits, vegetables, whole grains, lean protein, water--limit salt, fat, and sugar intake) and increase physical activity as tolerated.  Continue doing brain stimulating activities (puzzles, reading, adult coloring books, staying active) to keep memory sharp.   Complete Advance Directives, bring copy to next appt.   Make appt with eye doctor.   During the course of the visit the patient was educated and counseled about the following appropriate screening and preventive services:   Vaccines to include Pneumoccal, Influenza, Hepatitis B, Td, Zostavax, HCV  Cardiovascular Disease  Colorectal cancer screening  Bone density screening  Diabetes screening  Glaucoma  screening  Mammography/PAP  Nutrition counseling   Patient Instructions (the written plan) was given to the patient.   Gerilyn Nestle, RN  07/20/2016   Medical screening examination/treatment/procedure(s) were performed by non-physician practitioner and as supervising physician I was immediately available for consultation/collaboration. I agree with above. Binnie Rail, MD

## 2016-07-20 NOTE — Patient Instructions (Addendum)
Continue to eat heart healthy diet (full of fruits, vegetables, whole grains, lean protein, water--limit salt, fat, and sugar intake) and increase physical activity as tolerated.  Continue doing brain stimulating activities (puzzles, reading, adult coloring books, staying active) to keep memory sharp.   Complete Advance Directives, bring copy to next appt.   Make appt with eye doctor.    Fall Prevention in the Home Introduction Falls can cause injuries. They can happen to people of all ages. There are many things you can do to make your home safe and to help prevent falls. What can I do on the outside of my home?  Regularly fix the edges of walkways and driveways and fix any cracks.  Remove anything that might make you trip as you walk through a door, such as a raised step or threshold.  Trim any bushes or trees on the path to your home.  Use bright outdoor lighting.  Clear any walking paths of anything that might make someone trip, such as rocks or tools.  Regularly check to see if handrails are loose or broken. Make sure that both sides of any steps have handrails.  Any raised decks and porches should have guardrails on the edges.  Have any leaves, snow, or ice cleared regularly.  Use sand or salt on walking paths during winter.  Clean up any spills in your garage right away. This includes oil or grease spills. What can I do in the bathroom?  Use night lights.  Install grab bars by the toilet and in the tub and shower. Do not use towel bars as grab bars.  Use non-skid mats or decals in the tub or shower.  If you need to sit down in the shower, use a plastic, non-slip stool.  Keep the floor dry. Clean up any water that spills on the floor as soon as it happens.  Remove soap buildup in the tub or shower regularly.  Attach bath mats securely with double-sided non-slip rug tape.  Do not have throw rugs and other things on the floor that can make you trip. What can I do  in the bedroom?  Use night lights.  Make sure that you have a light by your bed that is easy to reach.  Do not use any sheets or blankets that are too big for your bed. They should not hang down onto the floor.  Have a firm chair that has side arms. You can use this for support while you get dressed.  Do not have throw rugs and other things on the floor that can make you trip. What can I do in the kitchen?  Clean up any spills right away.  Avoid walking on wet floors.  Keep items that you use a lot in easy-to-reach places.  If you need to reach something above you, use a strong step stool that has a grab bar.  Keep electrical cords out of the way.  Do not use floor polish or wax that makes floors slippery. If you must use wax, use non-skid floor wax.  Do not have throw rugs and other things on the floor that can make you trip. What can I do with my stairs?  Do not leave any items on the stairs.  Make sure that there are handrails on both sides of the stairs and use them. Fix handrails that are broken or loose. Make sure that handrails are as long as the stairways.  Check any carpeting to make sure that it is  firmly attached to the stairs. Fix any carpet that is loose or worn.  Avoid having throw rugs at the top or bottom of the stairs. If you do have throw rugs, attach them to the floor with carpet tape.  Make sure that you have a light switch at the top of the stairs and the bottom of the stairs. If you do not have them, ask someone to add them for you. What else can I do to help prevent falls?  Wear shoes that:  Do not have high heels.  Have rubber bottoms.  Are comfortable and fit you well.  Are closed at the toe. Do not wear sandals.  If you use a stepladder:  Make sure that it is fully opened. Do not climb a closed stepladder.  Make sure that both sides of the stepladder are locked into place.  Ask someone to hold it for you, if possible.  Clearly mark  and make sure that you can see:  Any grab bars or handrails.  First and last steps.  Where the edge of each step is.  Use tools that help you move around (mobility aids) if they are needed. These include:  Canes.  Walkers.  Scooters.  Crutches.  Turn on the lights when you go into a dark area. Replace any light bulbs as soon as they burn out.  Set up your furniture so you have a clear path. Avoid moving your furniture around.  If any of your floors are uneven, fix them.  If there are any pets around you, be aware of where they are.  Review your medicines with your doctor. Some medicines can make you feel dizzy. This can increase your chance of falling. Ask your doctor what other things that you can do to help prevent falls. This information is not intended to replace advice given to you by your health care provider. Make sure you discuss any questions you have with your health care provider. Document Released: 06/19/2009 Document Revised: 01/29/2016 Document Reviewed: 09/27/2014  2017 [employer]  Health Maintenance, Female Introduction Adopting a healthy lifestyle and getting preventive care can go a long way to promote health and wellness. Talk with your health care provider about what schedule of regular examinations is right for you. This is a good chance for you to check in with your provider about disease prevention and staying healthy. In between checkups, there are plenty of things you can do on your own. Experts have done a lot of research about which lifestyle changes and preventive measures are most likely to keep you healthy. Ask your health care provider for more information. Weight and diet Eat a healthy diet  Be sure to include plenty of vegetables, fruits, low-fat dairy products, and lean protein.  Do not eat a lot of foods high in solid fats, added sugars, or salt.  Get regular exercise. This is one of the most important things you can do for your  health.  Most adults should exercise for at least 150 minutes each week. The exercise should increase your heart rate and make you sweat (moderate-intensity exercise).  Most adults should also do strengthening exercises at least twice a week. This is in addition to the moderate-intensity exercise. Maintain a healthy weight  Body mass index (BMI) is a measurement that can be used to identify possible weight problems. It estimates body fat based on height and weight. Your health care provider can help determine your BMI and help you achieve or maintain a healthy  weight.  For females 57 years of age and older:  A BMI below 18.5 is considered underweight.  A BMI of 18.5 to 24.9 is normal.  A BMI of 25 to 29.9 is considered overweight.  A BMI of 30 and above is considered obese. Watch levels of cholesterol and blood lipids  You should start having your blood tested for lipids and cholesterol at 78 years of age, then have this test every 5 years.  You may need to have your cholesterol levels checked more often if:  Your lipid or cholesterol levels are high.  You are older than 78 years of age.  You are at high risk for heart disease. Cancer screening Lung Cancer  Lung cancer screening is recommended for adults 54-53 years old who are at high risk for lung cancer because of a history of smoking.  A yearly low-dose CT scan of the lungs is recommended for people who:  Currently smoke.  Have quit within the past 15 years.  Have at least a 30-pack-year history of smoking. A pack year is smoking an average of one pack of cigarettes a day for 1 year.  Yearly screening should continue until it has been 15 years since you quit.  Yearly screening should stop if you develop a health problem that would prevent you from having lung cancer treatment. Breast Cancer  Practice breast self-awareness. This means understanding how your breasts normally appear and feel.  It also means doing  regular breast self-exams. Let your health care provider know about any changes, no matter how small.  If you are in your 20s or 30s, you should have a clinical breast exam (CBE) by a health care provider every 1-3 years as part of a regular health exam.  If you are 77 or older, have a CBE every year. Also consider having a breast X-ray (mammogram) every year.  If you have a family history of breast cancer, talk to your health care provider about genetic screening.  If you are at high risk for breast cancer, talk to your health care provider about having an MRI and a mammogram every year.  Breast cancer gene (BRCA) assessment is recommended for women who have family members with BRCA-related cancers. BRCA-related cancers include:  Breast.  Ovarian.  Tubal.  Peritoneal cancers.  Results of the assessment will determine the need for genetic counseling and BRCA1 and BRCA2 testing. Cervical Cancer  Your health care provider may recommend that you be screened regularly for cancer of the pelvic organs (ovaries, uterus, and vagina). This screening involves a pelvic examination, including checking for microscopic changes to the surface of your cervix (Pap test). You may be encouraged to have this screening done every 3 years, beginning at age 83.  For women ages 66-65, health care providers may recommend pelvic exams and Pap testing every 3 years, or they may recommend the Pap and pelvic exam, combined with testing for human papilloma virus (HPV), every 5 years. Some types of HPV increase your risk of cervical cancer. Testing for HPV may also be done on women of any age with unclear Pap test results.  Other health care providers may not recommend any screening for nonpregnant women who are considered low risk for pelvic cancer and who do not have symptoms. Ask your health care provider if a screening pelvic exam is right for you.  If you have had past treatment for cervical cancer or a condition  that could lead to cancer, you need Pap tests  and screening for cancer for at least 20 years after your treatment. If Pap tests have been discontinued, your risk factors (such as having a new sexual partner) need to be reassessed to determine if screening should resume. Some women have medical problems that increase the chance of getting cervical cancer. In these cases, your health care provider may recommend more frequent screening and Pap tests. Colorectal Cancer  This type of cancer can be detected and often prevented.  Routine colorectal cancer screening usually begins at 78 years of age and continues through 78 years of age.  Your health care provider may recommend screening at an earlier age if you have risk factors for colon cancer.  Your health care provider may also recommend using home test kits to check for hidden blood in the stool.  A small camera at the end of a tube can be used to examine your colon directly (sigmoidoscopy or colonoscopy). This is done to check for the earliest forms of colorectal cancer.  Routine screening usually begins at age 28.  Direct examination of the colon should be repeated every 5-10 years through 78 years of age. However, you may need to be screened more often if early forms of precancerous polyps or small growths are found. Skin Cancer  Check your skin from head to toe regularly.  Tell your health care provider about any new moles or changes in moles, especially if there is a change in a mole's shape or color.  Also tell your health care provider if you have a mole that is larger than the size of a pencil eraser.  Always use sunscreen. Apply sunscreen liberally and repeatedly throughout the day.  Protect yourself by wearing long sleeves, pants, a wide-brimmed hat, and sunglasses whenever you are outside. Heart disease, diabetes, and high blood pressure  High blood pressure causes heart disease and increases the risk of stroke. High blood  pressure is more likely to develop in:  People who have blood pressure in the high end of the normal range (130-139/85-89 mm Hg).  People who are overweight or obese.  People who are African American.  If you are 53-6 years of age, have your blood pressure checked every 3-5 years. If you are 108 years of age or older, have your blood pressure checked every year. You should have your blood pressure measured twice-once when you are at a hospital or clinic, and once when you are not at a hospital or clinic. Record the average of the two measurements. To check your blood pressure when you are not at a hospital or clinic, you can use:  An automated blood pressure machine at a pharmacy.  A home blood pressure monitor.  If you are between 80 years and 7 years old, ask your health care provider if you should take aspirin to prevent strokes.  Have regular diabetes screenings. This involves taking a blood sample to check your fasting blood sugar level.  If you are at a normal weight and have a low risk for diabetes, have this test once every three years after 78 years of age.  If you are overweight and have a high risk for diabetes, consider being tested at a younger age or more often. Preventing infection Hepatitis B  If you have a higher risk for hepatitis B, you should be screened for this virus. You are considered at high risk for hepatitis B if:  You were born in a country where hepatitis B is common. Ask your health care  provider which countries are considered high risk.  Your parents were born in a high-risk country, and you have not been immunized against hepatitis B (hepatitis B vaccine).  You have HIV or AIDS.  You use needles to inject street drugs.  You live with someone who has hepatitis B.  You have had sex with someone who has hepatitis B.  You get hemodialysis treatment.  You take certain medicines for conditions, including cancer, organ transplantation, and autoimmune  conditions. Hepatitis C  Blood testing is recommended for:  Everyone born from [birth year] through 1963.  Anyone with known risk factors for hepatitis C. Sexually transmitted infections (STIs)  You should be screened for sexually transmitted infections (STIs) including gonorrhea and chlamydia if:  You are sexually active and are younger than 78 years of age.  You are older than 78 years of age and your health care provider tells you that you are at risk for this type of infection.  Your sexual activity has changed since you were last screened and you are at an increased risk for chlamydia or gonorrhea. Ask your health care provider if you are at risk.  If you do not have HIV, but are at risk, it may be recommended that you take a prescription medicine daily to prevent HIV infection. This is called pre-exposure prophylaxis (PrEP). You are considered at risk if:  You are sexually active and do not regularly use condoms or know the HIV status of your partner(s).  You take drugs by injection.  You are sexually active with a partner who has HIV. Talk with your health care provider about whether you are at high risk of being infected with HIV. If you choose to begin PrEP, you should first be tested for HIV. You should then be tested every 3 months for as long as you are taking PrEP. Pregnancy  If you are premenopausal and you may become pregnant, ask your health care provider about preconception counseling.  If you may become pregnant, take 400 to 800 micrograms (mcg) of folic acid every day.  If you want to prevent pregnancy, talk to your health care provider about birth control (contraception). Osteoporosis and menopause  Osteoporosis is a disease in which the bones lose minerals and strength with aging. This can result in serious bone fractures. Your risk for osteoporosis can be identified using a bone density scan.  If you are 82 years of age or older, or if you are at risk for  osteoporosis and fractures, ask your health care provider if you should be screened.  Ask your health care provider whether you should take a calcium or vitamin D supplement to lower your risk for osteoporosis.  Menopause may have certain physical symptoms and risks.  Hormone replacement therapy may reduce some of these symptoms and risks. Talk to your health care provider about whether hormone replacement therapy is right for you. Follow these instructions at home:  Schedule regular health, dental, and eye exams.  Stay current with your immunizations.  Do not use any tobacco products including cigarettes, chewing tobacco, or electronic cigarettes.  If you are pregnant, do not drink alcohol.  If you are breastfeeding, limit how much and how often you drink alcohol.  Limit alcohol intake to no more than 1 drink per day for nonpregnant women. One drink equals 12 ounces of beer, 5 ounces of wine, or 1 ounces of hard liquor.  Do not use street drugs.  Do not share needles.  Ask your health care  provider for help if you need support or information about quitting drugs.  Tell your health care provider if you often feel depressed.  Tell your health care provider if you have ever been abused or do not feel safe at home. This information is not intended to replace advice given to you by your health care provider. Make sure you discuss any questions you have with your health care provider. Document Released: 03/08/2011 Document Revised: 01/29/2016 Document Reviewed: 05/27/2015  2017 [employer]

## 2016-07-20 NOTE — Progress Notes (Signed)
Pre visit review using our clinic review tool, if applicable. No additional management support is needed unless otherwise documented below in the visit note. 

## 2016-10-12 ENCOUNTER — Encounter: Payer: Self-pay | Admitting: Internal Medicine

## 2016-10-12 ENCOUNTER — Ambulatory Visit (INDEPENDENT_AMBULATORY_CARE_PROVIDER_SITE_OTHER): Payer: PPO | Admitting: Internal Medicine

## 2016-10-12 VITALS — BP 126/76 | HR 57 | Temp 97.7°F | Resp 16 | Wt 157.0 lb

## 2016-10-12 DIAGNOSIS — IMO0002 Reserved for concepts with insufficient information to code with codable children: Secondary | ICD-10-CM | POA: Insufficient documentation

## 2016-10-12 DIAGNOSIS — R2681 Unsteadiness on feet: Secondary | ICD-10-CM | POA: Diagnosis not present

## 2016-10-12 DIAGNOSIS — R42 Dizziness and giddiness: Secondary | ICD-10-CM

## 2016-10-12 DIAGNOSIS — Z23 Encounter for immunization: Secondary | ICD-10-CM

## 2016-10-12 MED ORDER — DIAZEPAM 5 MG PO TABS: ORAL_TABLET | ORAL | 0 refills | Status: DC

## 2016-10-12 NOTE — Progress Notes (Signed)
Subjective:    Patient ID: Tara Bridges, female    DOB: 05-04-1936, 81 y.o.   MRN: [id number]  HPI She is here for an acute visit.   She had back pain over christmas and was taking meloxicam.  She was on it for about 3 weeks.  Her back got better so she stopped the meloxicam.  10 days later she started having dizziness.  Dizziness:   She feels wobbly and very unsteady. She denies true spinning sensation, except for once - she had it when she rolled over in bed.  She has it when she changes position and makes quick movements when she is walking.  She can move her head back and forth when driving/sitting and is fine - she will not feel dizzy.  She feels very unsteady walking.  There has been no improvement in the past few weeks.  She denies confusion, change in swallowing or speech, weakness/tingling/numbness in her arms or legs.    She has been taking OTC bonine and it has helped.   Medications and allergies reviewed with patient and updated if appropriate.  Patient Active Problem List   Diagnosis Date Noted  . Vitamin D deficiency 06/24/2013  . Osteopenia 06/05/2010  . NONSPECIFIC ABNORMAL ELECTROCARDIOGRAM 06/05/2010  . History of skin cancer 06/05/2010  . Diverticulosis of colon without hemorrhage 02/24/2009  . Hyperlipidemia 02/20/2008  . Essential hypertension 02/20/2008  . Hypothyroidism 02/06/2007  . Raynaud phenomenon 02/06/2007    Current Outpatient Prescriptions on File Prior to Visit  Medication Sig Dispense Refill  . Biotin 300 MCG TABS Take by mouth daily.      . Calcium Carbonate-Vit D-Min (CALCIUM 1200 PO) Take by mouth. Viactiv     . Cholecalciferol (VITAMIN D-3) 5000 UNITS TABS Take by mouth daily.      . cycloSPORINE (RESTASIS) 0.05 % ophthalmic emulsion Place 1 drop into both eyes 2 (two) times daily.      Marland Kitchen KRILL OIL PO Take 2 capsules by mouth daily.     Marland Kitchen lisinopril (PRINIVIL,ZESTRIL) 10 MG tablet TAKE ONE TABLET BY MOUTH ONE TIME DAILY 90 tablet 3  .  magnesium chloride (SLOW-MAG) 64 MG TBEC Take 1 tablet by mouth daily.      . meloxicam (MOBIC) 7.5 MG tablet 1 qd -bid prn 60 tablet 0  . Multiple Vitamin (MULTIVITAMIN) tablet Take 1 tablet by mouth daily.      . NON FORMULARY Take 1 capsule by mouth daily. [employer] Brand Probiotic    . NON FORMULARY Place 1 spray under the tongue 2 (two) times daily.    Marland Kitchen PREMARIN vaginal cream USE 2 GRAMS VAGINALLY EVERY NIGHT AT BEDTIME 1 -2 TIMES PER WEEK  6  . Psyllium (METAMUCIL PO) Take by mouth daily.      Marland Kitchen SYNTHROID 88 MCG tablet TAKE 1 TABLET BY MOUTH, EXCEPT TAKE 1/2 TABLET DAILY MONDAYS TUES., THURS., AND SATURDAY. 90 tablet 3   No current facility-administered medications on file prior to visit.     Past Medical History:  Diagnosis Date  . Diverticulosis of colon 2005   Dr Carlean Purl  . Fracture of ankle, closed 2012   boot, no surgery  . Hyperlipidemia   . Hypertension   . Osteopenia   . Raynaud's phenomenon   . Scoliosis   . Skin cancer    X 2  . Thyroid disease    hypothyroidism    Past Surgical History:  Procedure Laterality Date  . CATARACT EXTRACTION  bilaterally, Dr Katy Fitch  . COLONOSCOPY  2005   Dr Carlean Purl  . DILATION AND CURETTAGE OF UTERUS    . HYSTEROSCOPY     Dr Radene Knee  . I&D EXTREMITY  2004   infection L hand  . MOHS SURGERY  2011   L temple  . MOHS SURGERY  2014   R cheek  . TONSILLECTOMY    . uterine polyp      Social History   Social History  . Marital status: Single    Spouse name: N/A  . Number of children: N/A  . Years of education: N/A   Social History Main Topics  . Smoking status: Former Smoker    Quit date: 09/06/1982  . Smokeless tobacco: Never Used     Comment: Smoked [phone], up to < 1/2 ppd  . Alcohol use 12.6 oz/week    21 Glasses of wine per week  . Drug use: No  . Sexual activity: Not on file   Other Topics Concern  . Not on file   Social History Narrative  . No narrative on file    Family History  Problem Relation Age  of Onset  . Cancer Mother     stomach  . Alzheimer's disease Father   . Cancer Brother     leukemia  . Alcohol abuse Sister   . Alzheimer's disease Paternal Aunt     X 2  . Alzheimer's disease Paternal Grandmother   . Diabetes Neg Hx   . Heart disease Neg Hx   . Stroke Neg Hx   . Colon cancer Neg Hx     Review of Systems  Constitutional: Negative for chills and fever.  HENT: Positive for congestion (sometimes). Negative for ear pain, sinus pressure, sneezing and sore throat.   Respiratory: Negative for cough, shortness of breath and wheezing.   Cardiovascular: Negative for chest pain and palpitations.  Musculoskeletal: Positive for gait problem (unsteady).  Neurological: Negative for weakness, numbness and headaches.       Objective:   Vitals:   10/12/16 1442  BP: 126/76  Pulse: (!) 57  Resp: 16  Temp: 97.7 F (36.5 C)   Filed Weights   10/12/16 1442  Weight: 157 lb (71.2 kg)   Body mass index is 24.59 kg/m.  Wt Readings from Last 3 Encounters:  10/12/16 157 lb (71.2 kg)  07/20/16 155 lb 6.4 oz (70.5 kg)  07/12/16 154 lb (69.9 kg)     Physical Exam  Constitutional: She is oriented to person, place, and time. She appears well-developed and well-nourished. No distress.  HENT:  Head: Normocephalic and atraumatic.  Right Ear: External ear normal.  Left Ear: External ear normal.  Normal ear canals and TM b/l  Eyes: Conjunctivae are normal.  Neck: Neck supple. No tracheal deviation present. No thyromegaly present.  Cardiovascular: Normal rate, regular rhythm and normal heart sounds.   No murmur heard. Pulmonary/Chest: Effort normal and breath sounds normal. No respiratory distress. She has no wheezes. She has no rales.  Musculoskeletal: She exhibits no edema.  Lymphadenopathy:    She has no cervical adenopathy.  Neurological: She is alert and oriented to person, place, and time. No cranial nerve deficit. She exhibits normal muscle tone. Coordination normal.    Unsteady moving from chair to table, normal sensation and strength all extremities  Skin: Skin is warm and dry. She is not diaphoretic.  Psychiatric: She has a normal mood and affect. Her behavior is normal.  Assessment & Plan:   See Problem List for Assessment and Plan of chronic medical problems.

## 2016-10-12 NOTE — Progress Notes (Signed)
Pre visit review using our clinic review tool, if applicable. No additional management support is needed unless otherwise documented below in the visit note. 

## 2016-10-12 NOTE — Assessment & Plan Note (Signed)
?   BPPV or cerebellar insufficiency/CVA - need to rule out CVA Check MRI of brain Check MRA of neck and brain

## 2016-10-12 NOTE — Assessment & Plan Note (Signed)
?   BPPV or cerebellar insufficiency/CVA - need to rule out CVA Check MRI of brain Check MRA of neck and brain otc bonine as needed Call if symptoms change

## 2016-10-12 NOTE — Patient Instructions (Signed)
I have ordered an MRI of your brain and an MRA of your neck and brain to evaluate your vertigo.  Continue to take the anti-nausea medication.  If your symptoms change or worsen please call.     Dizziness Dizziness is a common problem. It is a feeling of unsteadiness or light-headedness. You may feel like you are about to faint. Dizziness can lead to injury if you stumble or fall. Anyone can become dizzy, but dizziness is more common in older adults. This condition can be caused by a number of things, including medicines, dehydration, or illness. Follow these instructions at home: Taking these steps may help with your condition: Eating and drinking  Drink enough fluid to keep your urine clear or pale yellow. This helps to keep you from becoming dehydrated. Try to drink more clear fluids, such as water.  Do not drink alcohol.  Limit your caffeine intake if directed by your health care provider.  Limit your salt intake if directed by your health care provider. Activity  Avoid making quick movements.  Rise slowly from chairs and steady yourself until you feel okay.  In the morning, first sit up on the side of the bed. When you feel okay, stand slowly while you hold onto something until you know that your balance is fine.  Move your legs often if you need to stand in one place for a long time. Tighten and relax your muscles in your legs while you are standing.  Do not drive or operate heavy machinery if you feel dizzy.  Avoid bending down if you feel dizzy. Place items in your home so that they are easy for you to reach without leaning over. Lifestyle  Do not use any tobacco products, including cigarettes, chewing tobacco, or electronic cigarettes. If you need help quitting, ask your health care provider.  Try to reduce your stress level, such as with yoga or meditation. Talk with your health care provider if you need help. General instructions  Watch your dizziness for any  changes.  Take medicines only as directed by your health care provider. Talk with your health care provider if you think that your dizziness is caused by a medicine that you are taking.  Tell a friend or a family member that you are feeling dizzy. If he or she notices any changes in your behavior, have this person call your health care provider.  Keep all follow-up visits as directed by your health care provider. This is important. Contact a health care provider if:  Your dizziness does not go away.  Your dizziness or light-headedness gets worse.  You feel nauseous.  You have reduced hearing.  You have new symptoms.  You are unsteady on your feet or you feel like the room is spinning. Get help right away if:  You vomit or have diarrhea and are unable to eat or drink anything.  You have problems talking, walking, swallowing, or using your arms, hands, or legs.  You feel generally weak.  You are not thinking clearly or you have trouble forming sentences. It may take a friend or family member to notice this.  You have chest pain, abdominal pain, shortness of breath, or sweating.  Your vision changes.  You notice any bleeding.  You have a headache.  You have neck pain or a stiff neck.  You have a fever. This information is not intended to replace advice given to you by your health care provider. Make sure you discuss any questions you have  with your health care provider. Document Released: 02/16/2001 Document Revised: 01/29/2016 Document Reviewed: 08/19/2014 Elsevier Interactive Patient Education  2017 [employer].

## 2016-10-16 NOTE — Progress Notes (Signed)
[facility] [place], [place] [address] Phone: [phone] Subjective:    I'm seeing this patient by the request  of:  Binnie Rail, MD   CC: back pain  [id number]  Tara Bridges is a 81 y.o. female coming in with complaint of  Low back pain. Patient states that this started approximately 2 months ago. Seems worse when she had a worse mattress. Recently had a new mattress and has noticed some improvement. Still has more of a dull, throbbing aching pain after sitting a long amount of time.    Past Medical History:  Diagnosis Date  . Diverticulosis of colon 2005   Dr Carlean Purl  . Fracture of ankle, closed 2012   boot, no surgery  . Hyperlipidemia   . Hypertension   . Osteopenia   . Raynaud's phenomenon   . Scoliosis   . Skin cancer    X 2  . Thyroid disease    hypothyroidism   Past Surgical History:  Procedure Laterality Date  . CATARACT EXTRACTION     bilaterally, Dr Katy Fitch  . COLONOSCOPY  2005   Dr Carlean Purl  . DILATION AND CURETTAGE OF UTERUS    . HYSTEROSCOPY     Dr Radene Knee  . I&D EXTREMITY  2004   infection L hand  . MOHS SURGERY  2011   L temple  . MOHS SURGERY  2014   R cheek  . TONSILLECTOMY    . uterine polyp     Social History   Social History  . Marital status: Single    Spouse name: N/A  . Number of children: N/A  . Years of education: N/A   Social History Main Topics  . Smoking status: Former Smoker    Quit date: 09/06/1982  . Smokeless tobacco: Never Used     Comment: Smoked [phone], up to < 1/2 ppd  . Alcohol use 12.6 oz/week    21 Glasses of wine per week  . Drug use: No  . Sexual activity: Not Asked   Other Topics Concern  . None   Social History Narrative  . None   Allergies  Allergen Reactions  . Cephalexin     Rash Because of a history of documented adverse serious drug reaction;Medi Alert bracelet  is recommended  . Nitrofurantoin     Fever, mental status changes Because of a  history of documented adverse serious drug reaction;Medi Alert bracelet  is recommended  . Penicillins     REACTION: RASH Because of a history of documented adverse serious drug reaction;Medi Alert bracelet  is recommended  . Lipitor [Atorvastatin]     D/Ced 08/2012 due to reading "Brain Drain" , Dr Rip Harbour   Family History  Problem Relation Age of Onset  . Cancer Mother     stomach  . Alzheimer's disease Father   . Cancer Brother     leukemia  . Alcohol abuse Sister   . Alzheimer's disease Paternal Aunt     X 2  . Alzheimer's disease Paternal Grandmother   . Diabetes Neg Hx   . Heart disease Neg Hx   . Stroke Neg Hx   . Colon cancer Neg Hx     Past medical history, social, surgical and family history all reviewed in electronic medical record.  No pertanent information unless stated regarding to the chief complaint.   Review of Systems:Review of systems updated and as accurate as of 10/18/16  No , visual changes, nausea,  vomiting, diarrhea, constipation, , abdominal pain, skin rash, fevers, chills, night sweats, weight loss, swollen lymph nodes, body aches, joint swelling, chest pain, shortness of breath, mood changes.  Positive for intermittent headaches, dizziness, muscle aches  Objective  Blood pressure (!) 158/80, pulse (!) 52, height 5\' 7"  (1.702 m), weight 157 lb (71.2 kg). Systems examined below as of 10/18/16   General: No apparent distress alert and oriented x3 mood and affect normal, dressed appropriately.  HEENT: Pupils equal, extraocular movements intact  Respiratory: Patient's speak in full sentences and does not appear short of breath  Cardiovascular: No lower extremity edema, non tender, no erythema  Skin: Warm dry intact with no signs of infection or rash on extremities or on axial skeleton.  Abdomen: Soft nontender  Neuro: Cranial nerves II through XII are intact, neurovascularly intact in all extremities with 2+ DTRs and 2+ pulses.  Lymph: No  lymphadenopathy of posterior or anterior cervical chain or axillae bilaterally.  Gait normal with good balance and coordination.  MSK:  Non tender with full range of motion and good stability and symmetric strength and tone of shoulders, elbows, wrist, hip, knee and ankles bilaterally.  Back Exam:  Inspection: lumbar scoliosis noted Motion: Flexion 35 deg, Extension 25 deg, Side Bending to 45 deg bilaterally,  Rotation to 45 deg bilaterally  SLR laying: Negative significant tightness flexors XSLR laying: Negative  Palpable tenderness: Mild tenderness to palpation and appears normal suture of the lumbar spine and left sacroiliac joint. FABER: Positive left. Sensory change: Gross sensation intact to all lumbar and sacral dermatomes.  Reflexes: 2+ at both patellar tendons, 2+ at achilles tendons, Babinski's downgoing.  Strength at foot  Plantar-flexion: 5/5 Dorsi-flexion: 5/5 Eversion: 5/5 Inversion: 5/5  Leg strength  Quad: 5/5 Hamstring: 5/5 Hip flexor: 5/5 Hip abductors: 4 out of 5 Gait unremarkable.  Procedure note [id number]; 15 minutes spent for Therapeutic exercises as stated in above notes.  This included exercises focusing on stretching, strengthening, with significant focus on eccentric aspects.  Low back exercises that included:  Pelvic tilt/bracing instruction to focus on control of the pelvic girdle and lower abdominal muscles  Glute strengthening exercises, focusing on proper firing of the glutes without engaging the low back muscles Proper stretching techniques for maximum relief for the hamstrings, hip flexors, low back and some rotation where tolerated   Proper technique shown and discussed handout in great detail with ATC.  All questions were discussed and answered.      Impression and Recommendations:     This case required medical decision making of moderate complexity.      Note: This dictation was prepared with Dragon dictation along with smaller phrase technology.  Any transcriptional errors that result from this process are unintentional.

## 2016-10-18 ENCOUNTER — Ambulatory Visit (INDEPENDENT_AMBULATORY_CARE_PROVIDER_SITE_OTHER): Payer: PPO | Admitting: Family Medicine

## 2016-10-18 ENCOUNTER — Encounter: Payer: Self-pay | Admitting: Family Medicine

## 2016-10-18 DIAGNOSIS — M5136 Other intervertebral disc degeneration, lumbar region: Secondary | ICD-10-CM | POA: Diagnosis not present

## 2016-10-18 NOTE — Patient Instructions (Signed)
Good to see you.  Ice 20 minutes 2 times daily. Usually after activity and before bed. pennsaid pinkie amount topically 2 times daily as needed.  If a flare take the meloxicam daily for 5 days  Exercises 3 times a week.  I am glad you have the new mattress Vitamin D 2000 IU daily  Turmeric 500mg  daily  See me again in 4 weeks.

## 2016-10-18 NOTE — Assessment & Plan Note (Signed)
Degenerative disc disease likely .  Does have scoliosis.  Patient does have some significant tight hip flexors. Discussed with patient at great length. Patient like to try conservative therapy. We discussed over-the-counter medications. How to use meloxicam appropriately. Topical anti-inflammatories given. Work with Product/process development scientist to learn home exercise. Follow-up again in 4 weeks and severe x-rays.

## 2016-10-20 ENCOUNTER — Telehealth: Payer: Self-pay | Admitting: Internal Medicine

## 2016-10-20 DIAGNOSIS — R42 Dizziness and giddiness: Secondary | ICD-10-CM

## 2016-10-20 MED ORDER — AMLODIPINE BESYLATE 5 MG PO TABS: 5.0000 mg | ORAL_TABLET | Freq: Every day | ORAL | 3 refills | Status: DC

## 2016-10-20 NOTE — Telephone Encounter (Signed)
Patient was in office about 10days. She states she is still not feeling well. She has a few questions she would like to talk to the nurse about. Can you follow up with her once you have time. Thank you.   Some questions are: Her dizziness is a little better, could she be referred to ENT. Dr. Tamala Julian told her it could be from sinus. Because once she started use folnase it got some better.  She said people have told the dizziness can come form bp medication. She wants to know if she can change that or come off of bp medication.

## 2016-10-20 NOTE — Telephone Encounter (Signed)
We can try changing her lisinopril to something else ( amlodipine) for a while and see if that helps.  She should not just stop her blood pressure medication because her blood pressure will be too high.  I have put in a referral to ENT.     Both orders are pending-sign if she agrees to trying a different medication

## 2016-10-20 NOTE — Telephone Encounter (Signed)
Spoke with pt to inform.  

## 2016-10-20 NOTE — Telephone Encounter (Signed)
Please advise, pt is on lisinopril.

## 2016-10-25 ENCOUNTER — Telehealth: Payer: Self-pay | Admitting: Internal Medicine

## 2016-10-25 NOTE — Telephone Encounter (Signed)
Error

## 2016-10-27 ENCOUNTER — Telehealth: Payer: Self-pay | Admitting: Internal Medicine

## 2016-10-27 NOTE — Telephone Encounter (Signed)
FYI Spoke to pt. She does not want to have the MRI's done. She's almost positive it is vertigo. She is only having the dizziness with no headache. She only wants to see the ENT.

## 2016-10-28 NOTE — Telephone Encounter (Signed)
Ok, noted. Will hold off on MRI. Already referred to ENT.

## 2016-11-01 ENCOUNTER — Encounter: Payer: Self-pay | Admitting: Internal Medicine

## 2016-11-11 ENCOUNTER — Encounter: Payer: Self-pay | Admitting: Internal Medicine

## 2016-11-11 ENCOUNTER — Ambulatory Visit (INDEPENDENT_AMBULATORY_CARE_PROVIDER_SITE_OTHER): Payer: PPO | Admitting: Internal Medicine

## 2016-11-11 VITALS — BP 130/88 | HR 56 | Temp 97.6°F | Ht 67.0 in | Wt 157.0 lb

## 2016-11-11 DIAGNOSIS — R42 Dizziness and giddiness: Secondary | ICD-10-CM

## 2016-11-11 DIAGNOSIS — J309 Allergic rhinitis, unspecified: Secondary | ICD-10-CM | POA: Diagnosis not present

## 2016-11-11 DIAGNOSIS — I10 Essential (primary) hypertension: Secondary | ICD-10-CM | POA: Diagnosis not present

## 2016-11-11 MED ORDER — MECLIZINE HCL 12.5 MG PO TABS: 12.5000 mg | ORAL_TABLET | Freq: Three times a day (TID) | ORAL | 1 refills | Status: AC | PRN

## 2016-11-11 NOTE — Progress Notes (Signed)
Pre visit review using our clinic review tool, if applicable. No additional management support is needed unless otherwise documented below in the visit note. 

## 2016-11-11 NOTE — Assessment & Plan Note (Signed)
Positional, likely middle ear related more likely left sided, for meclizine prn, consider valium for worsening symptoms, declines mucinex recommendation due to prior adverse reaction, and f/u ENT tomorrow as planned

## 2016-11-11 NOTE — Assessment & Plan Note (Signed)
Mild to mod, for allegra otc prn, ok to change flonase to nasacort otc, consider otc zaditor vs optivar for eye symtpoms, to f/u any worsening symptoms or concerns, consider allergy referral

## 2016-11-11 NOTE — Progress Notes (Signed)
Subjective:    Patient ID: Tara Bridges, female    DOB: 11/05/1935, 81 y.o.   MRN: [id number]  HPI  Here to f/u with c/o positional vertigo most noticed at night, assoc with ear fullness and vague discomfort left > right for several weeks.  Had a particularly prominent episode earlier today with elevated BP which prompted her making an appt, but BP now improved after several hours post dizziness and rest.  Does have several wks ongoing nasal and eye allergy symptoms with clearish congestion, itch and sneezing, without fever, pain, ST, cough, swelling or wheezing.  Has tried flonase x 2 days and helped some but now out. No nosebleeds.  Has not tried meclizine or allegra.  Was offered MRI per PCP, but pt declines, and incidentally has appt tomorrow with ENT/Dr [facility].  No falls and Pt denies chest pain, increased sob or doe, wheezing, orthopnea, PND, increased LE swelling, palpitations, dizziness or syncope. Past Medical History:  Diagnosis Date  . Diverticulosis of colon 2005   Dr Carlean Purl  . Fracture of ankle, closed 2012   boot, no surgery  . Hyperlipidemia   . Hypertension   . Osteopenia   . Raynaud's phenomenon   . Scoliosis   . Skin cancer    X 2  . Thyroid disease    hypothyroidism   Past Surgical History:  Procedure Laterality Date  . CATARACT EXTRACTION     bilaterally, Dr Katy Fitch  . COLONOSCOPY  2005   Dr Carlean Purl  . DILATION AND CURETTAGE OF UTERUS    . HYSTEROSCOPY     Dr Radene Knee  . I&D EXTREMITY  2004   infection L hand  . MOHS SURGERY  2011   L temple  . MOHS SURGERY  2014   R cheek  . TONSILLECTOMY    . uterine polyp      reports that she quit smoking about 34 years ago. She has never used smokeless tobacco. She reports that she drinks about 12.6 oz of alcohol per week . She reports that she does not use drugs. family history includes Alcohol abuse in her sister; Alzheimer's disease in her father, paternal aunt, and paternal grandmother; Cancer in her brother and  mother. Allergies  Allergen Reactions  . Cephalexin     Rash Because of a history of documented adverse serious drug reaction;Medi Alert bracelet  is recommended  . Nitrofurantoin     Fever, mental status changes Because of a history of documented adverse serious drug reaction;Medi Alert bracelet  is recommended  . Penicillins     REACTION: RASH Because of a history of documented adverse serious drug reaction;Medi Alert bracelet  is recommended  . Lipitor [Atorvastatin]     D/Ced 08/2012 due to reading "Brain Drain" , Dr Rip Harbour   Current Outpatient Prescriptions on File Prior to Visit  Medication Sig Dispense Refill  . amLODipine (NORVASC) 5 MG tablet Take 1 tablet (5 mg total) by mouth daily. 30 tablet 3  . Biotin 300 MCG TABS Take by mouth daily.      . Calcium Carbonate-Vit D-Min (CALCIUM 1200 PO) Take by mouth. Viactiv     . Cholecalciferol (VITAMIN D-3) 5000 UNITS TABS Take by mouth daily.      . cycloSPORINE (RESTASIS) 0.05 % ophthalmic emulsion Place 1 drop into both eyes 2 (two) times daily.      . diazepam (VALIUM) 5 MG tablet Take 30 minutes to 1 hour prior to MRI, may repeat x 1 if needed  2 tablet 0  . KRILL OIL PO Take 2 capsules by mouth daily.     Marland Kitchen lisinopril (PRINIVIL,ZESTRIL) 10 MG tablet TAKE ONE TABLET BY MOUTH ONE TIME DAILY 90 tablet 3  . magnesium chloride (SLOW-MAG) 64 MG TBEC Take 1 tablet by mouth daily.      . meloxicam (MOBIC) 7.5 MG tablet 1 qd -bid prn 60 tablet 0  . Multiple Vitamin (MULTIVITAMIN) tablet Take 1 tablet by mouth daily.      . NON FORMULARY Take 1 capsule by mouth daily. [employer] Brand Probiotic    . NON FORMULARY Place 1 spray under the tongue 2 (two) times daily.    Marland Kitchen PREMARIN vaginal cream USE 2 GRAMS VAGINALLY EVERY NIGHT AT BEDTIME 1 -2 TIMES PER WEEK  6  . Psyllium (METAMUCIL PO) Take by mouth daily.      Marland Kitchen SYNTHROID 88 MCG tablet TAKE 1 TABLET BY MOUTH, EXCEPT TAKE 1/2 TABLET DAILY MONDAYS TUES., THURS., AND SATURDAY. 90 tablet 3    No current facility-administered medications on file prior to visit.    Review of Systems  Constitutional: Negative for unusual diaphoresis or night sweats HENT: Negative for ear swelling or discharge Eyes: Negative for worsening visual haziness  Respiratory: Negative for choking and stridor.   Gastrointestinal: Negative for distension or worsening eructation Genitourinary: Negative for retention or change in urine volume.  Musculoskeletal: Negative for other MSK pain or swelling Skin: Negative for color change and worsening wound Neurological: Negative for tremors and numbness other than noted  Psychiatric/Behavioral: Negative for decreased concentration or agitation other than above   All other system neg per pt    Objective:   Physical Exam BP 130/88 (BP Location: Left Arm, Patient Position: Sitting, Cuff Size: Normal)   Pulse (!) 56   Temp 97.6 F (36.4 C) (Oral)   Ht 5\' 7"  (1.702 m)   Wt 157 lb (71.2 kg)   SpO2 99%   BMI 24.59 kg/m  VS noted,  Constitutional: Pt appears in no apparent distress HENT: Head: NCAT.  Right Ear: External ear normal.  Left Ear: External ear normal.  Eyes: . Pupils are equal, round, and reactive to light. Conjunctivae and EOM are normal Bilat tm's with mild erythema worse on left with + light reflex and probable post fluid;.  Max sinus areas non tender.  Pharynx with mild erythema, no exudate Neck: Normal range of motion. Neck supple. No neck LA Cardiovascular: Normal rate and regular rhythm.   Pulmonary/Chest: Effort normal and breath sounds without rales or wheezing.  Neurological: Pt is alert. Not confused , motor 5/5 intact Skin: Skin is warm. No rash, no LE edema Psychiatric: Pt behavior is normal. No agitation.  No other new exam findings    Assessment & Plan:

## 2016-11-11 NOTE — Patient Instructions (Signed)
Please take all new medication as prescribed - the meclizine  Please take all new medication OTC as well , including the Allegra 180 mg per day, and Nasacort AQ as directed  Please keep your appt with Dr Erik Obey tomorrow  Please continue all other medications as before, and refills have been done if requested.  Please have the pharmacy call with any other refills you may need.  Please keep your appointments with your specialists as you may have planned

## 2016-11-11 NOTE — Assessment & Plan Note (Addendum)
Elevated earlier today, likely reactive related to flare of vertigo symptoms, now improved, pt reassured, will cont same tx and cont to monitor at home and next visit

## 2016-11-12 DIAGNOSIS — H8111 Benign paroxysmal vertigo, right ear: Secondary | ICD-10-CM | POA: Diagnosis not present

## 2016-11-12 DIAGNOSIS — R42 Dizziness and giddiness: Secondary | ICD-10-CM | POA: Diagnosis not present

## 2016-11-15 ENCOUNTER — Ambulatory Visit: Payer: PPO | Admitting: Family Medicine

## 2016-12-21 NOTE — Telephone Encounter (Signed)
Can this be closed Lovena Le? Thank you.

## 2016-12-27 DIAGNOSIS — H10413 Chronic giant papillary conjunctivitis, bilateral: Secondary | ICD-10-CM | POA: Diagnosis not present

## 2016-12-27 DIAGNOSIS — H04123 Dry eye syndrome of bilateral lacrimal glands: Secondary | ICD-10-CM | POA: Diagnosis not present

## 2017-01-13 ENCOUNTER — Other Ambulatory Visit: Payer: Self-pay | Admitting: Internal Medicine

## 2017-02-10 ENCOUNTER — Other Ambulatory Visit: Payer: Self-pay | Admitting: Internal Medicine

## 2017-02-16 ENCOUNTER — Emergency Department (HOSPITAL_COMMUNITY)
Admission: EM | Admit: 2017-02-16 | Discharge: 2017-02-16 | Disposition: A | Discharge status: Home / Self Care | Payer: PPO | Attending: Emergency Medicine | Admitting: Emergency Medicine

## 2017-02-16 DIAGNOSIS — Z87891 Personal history of nicotine dependence: Secondary | ICD-10-CM | POA: Diagnosis not present

## 2017-02-16 DIAGNOSIS — I10 Essential (primary) hypertension: Secondary | ICD-10-CM | POA: Diagnosis not present

## 2017-02-16 DIAGNOSIS — E039 Hypothyroidism, unspecified: Secondary | ICD-10-CM | POA: Diagnosis not present

## 2017-02-16 DIAGNOSIS — W5581XA Bitten by other mammals, initial encounter: Secondary | ICD-10-CM

## 2017-02-16 DIAGNOSIS — R21 Rash and other nonspecific skin eruption: Secondary | ICD-10-CM | POA: Diagnosis not present

## 2017-02-16 DIAGNOSIS — W5381XA Bitten by other rodent, initial encounter: Secondary | ICD-10-CM | POA: Diagnosis not present

## 2017-02-16 DIAGNOSIS — Z2914 Encounter for prophylactic rabies immune globin: Secondary | ICD-10-CM | POA: Diagnosis not present

## 2017-02-16 DIAGNOSIS — S40861A Insect bite (nonvenomous) of right upper arm, initial encounter: Secondary | ICD-10-CM | POA: Diagnosis not present

## 2017-02-16 DIAGNOSIS — Z79899 Other long term (current) drug therapy: Secondary | ICD-10-CM | POA: Insufficient documentation

## 2017-02-16 DIAGNOSIS — Z203 Contact with and (suspected) exposure to rabies: Secondary | ICD-10-CM | POA: Insufficient documentation

## 2017-02-16 DIAGNOSIS — Z23 Encounter for immunization: Secondary | ICD-10-CM | POA: Diagnosis not present

## 2017-02-16 MED ORDER — RABIES IMMUNE GLOBULIN 150 UNIT/ML IM INJ
20.0000 [IU]/kg | INJECTION | Freq: Once | INTRAMUSCULAR | Status: AC
  Administered 2017-02-16: 1350 [IU] via INTRAMUSCULAR
  Filled 2017-02-16: qty 9

## 2017-02-16 MED ORDER — TETANUS-DIPHTH-ACELL PERTUSSIS 5-2.5-18.5 LF-MCG/0.5 IM SUSP
0.5000 mL | Freq: Once | INTRAMUSCULAR | Status: AC
  Administered 2017-02-16: 0.5 mL via INTRAMUSCULAR
  Filled 2017-02-16: qty 0.5

## 2017-02-16 MED ORDER — RABIES VACCINE, PCEC IM SUSR
1.0000 mL | Freq: Once | INTRAMUSCULAR | Status: AC
  Administered 2017-02-16: 1 mL via INTRAMUSCULAR
  Filled 2017-02-16: qty 1

## 2017-02-16 NOTE — ED Triage Notes (Signed)
PT thinks she has bat bites on her arms.  She had travelled and when she woke up in the morning she noticed bites on her arms, which resemble insect bites.  The next day she found a bat in her room.

## 2017-02-16 NOTE — ED Provider Notes (Signed)
[facility] DEPT Provider Note   CSN: [id number] Arrival date & time: 02/16/17  0947     History   Chief Complaint Chief Complaint  Patient presents with  . Animal Bite    HPI Tara Bridges is a 81 y.o. female who presents to the emergency department today for possible bat bites to her arm on Sunday night. The patient states that she returned from a trip on Sunday night and woke up on Monday morning with multiple areas that looked like bug bites to her arm (1 to right arm and 1 to left arm). She noted another bite on her left arm Tuesday morning. Late Tuesday she noted a bat in her house. The bat has been captured and removed from the house. She is presenting for concerns that the bat caused her bites. She had someone watching her house during her trip out of town which is how she believes the bat got inside. She is unsure when her last Tn shot was. She is not having any pain associated with this. No fever, chills. No numbness, tingling or weakness distal to the sites. Full ROM. No drainage for sites. States itchy.   HPI  Past Medical History:  Diagnosis Date  . Diverticulosis of colon 2005   Dr Carlean Purl  . Fracture of ankle, closed 2012   boot, no surgery  . Hyperlipidemia   . Hypertension   . Osteopenia   . Raynaud's phenomenon   . Scoliosis   . Skin cancer    X 2  . Thyroid disease    hypothyroidism    Patient Active Problem List   Diagnosis Date Noted  . Allergic rhinitis 11/11/2016  . Degeneration, intervertebral disc, lumbar 10/18/2016  . Vertigo 10/12/2016  . Unsteady 10/12/2016  . Vitamin D deficiency 06/24/2013  . Osteopenia 06/05/2010  . NONSPECIFIC ABNORMAL ELECTROCARDIOGRAM 06/05/2010  . History of skin cancer 06/05/2010  . Diverticulosis of colon without hemorrhage 02/24/2009  . Hyperlipidemia 02/20/2008  . Essential hypertension 02/20/2008  . Hypothyroidism 02/06/2007  . Raynaud phenomenon 02/06/2007    Past Surgical History:  Procedure  Laterality Date  . CATARACT EXTRACTION     bilaterally, Dr Katy Fitch  . COLONOSCOPY  2005   Dr Carlean Purl  . DILATION AND CURETTAGE OF UTERUS    . HYSTEROSCOPY     Dr Radene Knee  . I&D EXTREMITY  2004   infection L hand  . MOHS SURGERY  2011   L temple  . MOHS SURGERY  2014   R cheek  . TONSILLECTOMY    . uterine polyp      OB History    No data available       Home Medications    Prior to Admission medications   Medication Sig Start Date End Date Taking? Authorizing Provider  amLODipine (NORVASC) 5 MG tablet Take 1 tablet (5 mg total) by mouth daily. 10/20/16   Binnie Rail, MD  Biotin 300 MCG TABS Take by mouth daily.      [provider]  Calcium Carbonate-Vit D-Min (CALCIUM 1200 PO) Take by mouth. Viactiv     [provider]  Cholecalciferol (VITAMIN D-3) 5000 UNITS TABS Take by mouth daily.      [provider]  cycloSPORINE (RESTASIS) 0.05 % ophthalmic emulsion Place 1 drop into both eyes 2 (two) times daily.      [provider]  diazepam (VALIUM) 5 MG tablet Take 30 minutes to 1 hour prior to MRI, may repeat x 1 if  needed 10/12/16   Binnie Rail, MD  KRILL OIL PO Take 2 capsules by mouth daily.     [provider]  lisinopril (PRINIVIL,ZESTRIL) 10 MG tablet TAKE ONE TABLET BY MOUTH ONE TIME DAILY 07/12/16   Binnie Rail, MD  magnesium chloride (SLOW-MAG) 64 MG TBEC Take 1 tablet by mouth daily.      [provider]  meclizine (ANTIVERT) 12.5 MG tablet Take 1 tablet (12.5 mg total) by mouth 3 (three) times daily as needed for dizziness. 11/11/16 11/11/17  Biagio Borg, MD  meloxicam (MOBIC) 7.5 MG tablet TAKE 1 TABLET BY MOUTH ONE TO TWO TIMES DAILY AS NEEDED 02/10/17   Binnie Rail, MD  Multiple Vitamin (MULTIVITAMIN) tablet Take 1 tablet by mouth daily.      [provider]  NON FORMULARY Take 1 capsule by mouth daily. Target Brand Probiotic    [provider]  NON FORMULARY Place 1 spray under the tongue 2  (two) times daily.    [provider]  PREMARIN vaginal cream USE 2 GRAMS VAGINALLY EVERY NIGHT AT BEDTIME 1 -2 TIMES PER WEEK 04/21/15   [provider]  Psyllium (METAMUCIL PO) Take by mouth daily.      [provider]  SYNTHROID 88 MCG tablet TAKE 1 TABLET BY MOUTH, EXCEPT TAKE 1/2 TABLET DAILY MONDAYS TUES., THURS., AND SATURDAY. 07/12/16   Binnie Rail, MD    Family History Family History  Problem Relation Age of Onset  . Cancer Mother        stomach  . Alzheimer's disease Father   . Cancer Brother        leukemia  . Alcohol abuse Sister   . Alzheimer's disease Paternal Aunt        X 2  . Alzheimer's disease Paternal Grandmother   . Diabetes Neg Hx   . Heart disease Neg Hx   . Stroke Neg Hx   . Colon cancer Neg Hx     Social History Social History  Substance Use Topics  . Smoking status: Former Smoker    Quit date: 09/06/1982  . Smokeless tobacco: Never Used     Comment: Smoked [phone], up to < 1/2 ppd  . Alcohol use 12.6 oz/week    21 Glasses of wine per week     Allergies   Cephalexin; Nitrofurantoin; Penicillins; and Lipitor [atorvastatin]   Review of Systems Review of Systems  All other systems reviewed and are negative.    Physical Exam Updated Vital Signs BP (!) 148/68 (BP Location: Left Arm)   Pulse (!) 54   Temp 98.2 F (36.8 C) (Oral)   Resp 18   Ht 5\' 7"  (1.702 m)   Wt 68.4 kg (150 lb 14.4 oz)   SpO2 100%   BMI 23.63 kg/m   Physical Exam  Constitutional: She appears well-developed and well-nourished.  HENT:  Head: Normocephalic and atraumatic.  Eyes: Conjunctivae are normal. Right eye exhibits no discharge. Left eye exhibits no discharge. No scleral icterus.  Pulmonary/Chest: Effort normal. No respiratory distress.  Musculoskeletal:  Range of motion of shoulder, elbow, wrist intact and full bilaterally. Compartments soft. Neurovascular intact distally.  Neurological: She is alert.  Skin: Skin is warm and  dry. No pallor.  There are multiple "bites" noted. Right upper arm, 5 mm area of erythema, blanchable with no fluctuance or abscess noted. On the left upper arm there is too side-by-side 5 mm areas of erythema, blanchable with no fluctuance or  abscess noted. No puncture wounds noted. No drainage.   Psychiatric: She has a normal mood and affect.  Nursing note and vitals reviewed.    ED Treatments / Results  Labs (all labs ordered are listed, but only abnormal results are displayed) Labs Reviewed - No data to display  EKG  EKG Interpretation None       Radiology No results found.  Procedures Procedures (including critical care time)  Medications Ordered in ED Medications  rabies immune globulin (HYPERAB) injection 1,350 Units (1,350 Units Intramuscular Given 02/16/17 1203)  rabies vaccine (RABAVERT) injection 1 mL (1 mL Intramuscular Given 02/16/17 1159)  Tdap (BOOSTRIX) injection 0.5 mL (0.5 mLs Intramuscular Given 02/16/17 1158)     Initial Impression / Assessment and Plan / ED Course  I have reviewed the triage vital signs and the nursing notes.  Pertinent labs & imaging results that were available during my care of the patient were reviewed by me and considered in my medical decision making (see chart for details).     This is a 81 year old female who presents today for bat exposure and bites to arms. The patient has normal vitals on presentation and is non-toxic appearing. There is no signs of infection to the sites. The bat is not available for testing. Patient given post-exposure prophylaxis rabies vaccine and immune globulin. Gave patient schedule for further vaccines at [facility]. Patient told they can return at anytime for worsening or new concerning symptoms. Patient verbalizes understanding. All questions answered. No further questions at this time.   Patient discussed with Dr. Alvino Chapel who  is in agreenace with plan.   Final Clinical Impressions(s) / ED  Diagnoses   Final diagnoses:  Need for post exposure prophylaxis for rabies  Bat bite wound    New Prescriptions New Prescriptions   No medications on file     Lorelle Gibbs 02/16/17 1219    Davonna Belling, MD 02/16/17 1626

## 2017-02-16 NOTE — Discharge Instructions (Signed)
Due to exposure to a bat, you were given you your first day of rabies postexposure prophylaxis and vaccine today. We also updated your tetanus shot. Please follow with [facility] urgent [facility] for further vaccine doses. I have attached insturctions on this schedule. If you develop fever, drainage from the sites, extending redness up the arm please return for reevaluation. You can return to the emergency department for worsening or new concerning symptoms.

## 2017-02-19 ENCOUNTER — Ambulatory Visit (HOSPITAL_COMMUNITY)
Admission: EM | Admit: 2017-02-19 | Discharge: 2017-02-19 | Disposition: A | Discharge status: Home / Self Care | Payer: PPO | Attending: Family Medicine | Admitting: Family Medicine

## 2017-02-19 ENCOUNTER — Encounter (HOSPITAL_COMMUNITY): Payer: Self-pay

## 2017-02-19 DIAGNOSIS — W5581XD Bitten by other mammals, subsequent encounter: Secondary | ICD-10-CM

## 2017-02-19 DIAGNOSIS — Z23 Encounter for immunization: Secondary | ICD-10-CM

## 2017-02-19 DIAGNOSIS — S51852D Open bite of left forearm, subsequent encounter: Secondary | ICD-10-CM | POA: Diagnosis not present

## 2017-02-19 DIAGNOSIS — Z203 Contact with and (suspected) exposure to rabies: Secondary | ICD-10-CM

## 2017-02-19 MED ORDER — RABIES VACCINE, PCEC IM SUSR
INTRAMUSCULAR | Status: AC
  Filled 2017-02-19: qty 1

## 2017-02-19 MED ORDER — RABIES VACCINE, PCEC IM SUSR
1.0000 mL | Freq: Once | INTRAMUSCULAR | Status: AC
  Administered 2017-02-19: 1 mL via INTRAMUSCULAR

## 2017-02-19 NOTE — Discharge Instructions (Signed)
Return on 02/23/2017 for day 7 vaccine

## 2017-02-19 NOTE — ED Triage Notes (Signed)
Pt here for her day 3 rabies vaccine

## 2017-02-23 ENCOUNTER — Ambulatory Visit (HOSPITAL_COMMUNITY)
Admission: EM | Admit: 2017-02-23 | Discharge: 2017-02-23 | Disposition: A | Discharge status: Home / Self Care | Payer: PPO | Attending: Family Medicine | Admitting: Family Medicine

## 2017-02-23 ENCOUNTER — Encounter (HOSPITAL_COMMUNITY): Payer: Self-pay | Admitting: Emergency Medicine

## 2017-02-23 DIAGNOSIS — Z23 Encounter for immunization: Secondary | ICD-10-CM

## 2017-02-23 DIAGNOSIS — W5581XA Bitten by other mammals, initial encounter: Secondary | ICD-10-CM

## 2017-02-23 DIAGNOSIS — Z203 Contact with and (suspected) exposure to rabies: Secondary | ICD-10-CM

## 2017-02-23 DIAGNOSIS — S51852A Open bite of left forearm, initial encounter: Secondary | ICD-10-CM

## 2017-02-23 MED ORDER — RABIES VACCINE, PCEC IM SUSR
INTRAMUSCULAR | Status: AC
  Filled 2017-02-23: qty 1

## 2017-02-23 MED ORDER — RABIES VACCINE, PCEC IM SUSR
1.0000 mL | Freq: Once | INTRAMUSCULAR | Status: AC
  Administered 2017-02-23: 1 mL via INTRAMUSCULAR

## 2017-02-23 NOTE — Discharge Instructions (Signed)
Please return to the Urgent Care Center on 03/02/2017 for your Day 14 injection.

## 2017-02-23 NOTE — ED Triage Notes (Signed)
The patient presented to the [facility] to receive her Day 7 rabies booster vaccine.

## 2017-03-02 ENCOUNTER — Ambulatory Visit (HOSPITAL_COMMUNITY)
Admission: EM | Admit: 2017-03-02 | Discharge: 2017-03-02 | Disposition: A | Discharge status: Home / Self Care | Payer: PPO | Attending: Family Medicine | Admitting: Family Medicine

## 2017-03-02 ENCOUNTER — Encounter (HOSPITAL_COMMUNITY): Payer: Self-pay | Admitting: Emergency Medicine

## 2017-03-02 DIAGNOSIS — Z23 Encounter for immunization: Secondary | ICD-10-CM | POA: Diagnosis not present

## 2017-03-02 DIAGNOSIS — Z203 Contact with and (suspected) exposure to rabies: Secondary | ICD-10-CM

## 2017-03-02 MED ORDER — RABIES VACCINE, PCEC IM SUSR
1.0000 mL | Freq: Once | INTRAMUSCULAR | Status: AC
  Administered 2017-03-02: 1 mL via INTRAMUSCULAR

## 2017-03-02 MED ORDER — RABIES VACCINE, PCEC IM SUSR
INTRAMUSCULAR | Status: AC
  Filled 2017-03-02: qty 1

## 2017-03-02 NOTE — ED Triage Notes (Signed)
The patient presented to the [facility] to receive her Day 14 rabies booster vaccine.

## 2017-03-02 NOTE — Discharge Instructions (Signed)
Please return to the [facility] with any further issues related to the rabies vaccination series.

## 2017-03-08 ENCOUNTER — Ambulatory Visit (INDEPENDENT_AMBULATORY_CARE_PROVIDER_SITE_OTHER): Payer: PPO | Admitting: Internal Medicine

## 2017-03-08 ENCOUNTER — Encounter: Payer: Self-pay | Admitting: Internal Medicine

## 2017-03-08 VITALS — BP 144/88 | HR 48 | Temp 98.7°F | Resp 16 | Wt 156.0 lb

## 2017-03-08 DIAGNOSIS — I10 Essential (primary) hypertension: Secondary | ICD-10-CM | POA: Diagnosis not present

## 2017-03-08 MED ORDER — AMLODIPINE BESYLATE 2.5 MG PO TABS: 2.5000 mg | ORAL_TABLET | Freq: Every day | ORAL | 3 refills | Status: DC

## 2017-03-08 NOTE — Patient Instructions (Signed)
Continue to monitor your BP - if it continues to be over 140/90 start amlodipine 2.5 mg nightly.    Call with questions or concerns.

## 2017-03-08 NOTE — Assessment & Plan Note (Addendum)
BP has been a little elevated at times Taking lisinopril 10 mg daily Monitor - if still elevated start amlodipine 2.5 mg daily Limit mobic Limit sodium Call with questions or concerns Follow-up in 6 months, sooner if blood pressure is not well controlled

## 2017-03-08 NOTE — Progress Notes (Signed)
Subjective:    Patient ID: Tara Bridges, female    DOB: [birth year], 81 y.o.   MRN: [id number]  HPI She is here for follow up.  Hypertension: She is taking her medication daily - she only takes the lisinopril at this time. She is compliant with a low sodium diet.  She denies chest pain, palpitations, edema, shortness of breath and regular headaches.  She does monitor her blood pressure at home and has gotten:  BP 129/76, 133/77, 144/88, 155/92, 147/90. She is unsure why it has been elevated recently.  She did go to the ED last month after finding a bat in her house and realizing that she had been bitten by a bat they did start the rabies vaccine series and she will finish that soon. She also got a tetanus vaccine. She is asymptomatic.   Medications and allergies reviewed with patient and updated if appropriate.  Patient Active Problem List   Diagnosis Date Noted  . Allergic rhinitis 11/11/2016  . Degeneration, intervertebral disc, lumbar 10/18/2016  . Vertigo 10/12/2016  . Unsteady 10/12/2016  . Vitamin D deficiency 06/24/2013  . Osteopenia 06/05/2010  . NONSPECIFIC ABNORMAL ELECTROCARDIOGRAM 06/05/2010  . History of skin cancer 06/05/2010  . Diverticulosis of colon without hemorrhage 02/24/2009  . Hyperlipidemia 02/20/2008  . Essential hypertension 02/20/2008  . Hypothyroidism 02/06/2007  . Raynaud phenomenon 02/06/2007    Current Outpatient Prescriptions on File Prior to Visit  Medication Sig Dispense Refill  . amLODipine (NORVASC) 5 MG tablet Take 1 tablet (5 mg total) by mouth daily. 30 tablet 3  . Biotin 300 MCG TABS Take by mouth daily.      . Calcium Carbonate-Vit D-Min (CALCIUM 1200 PO) Take by mouth. Viactiv     . Cholecalciferol (VITAMIN D-3) 5000 UNITS TABS Take by mouth daily.      . cycloSPORINE (RESTASIS) 0.05 % ophthalmic emulsion Place 1 drop into both eyes 2 (two) times daily.      . diazepam (VALIUM) 5 MG tablet Take 30 minutes to 1 hour prior to MRI, may  repeat x 1 if needed 2 tablet 0  . KRILL OIL PO Take 2 capsules by mouth daily.     Marland Kitchen lisinopril (PRINIVIL,ZESTRIL) 10 MG tablet TAKE ONE TABLET BY MOUTH ONE TIME DAILY 90 tablet 3  . magnesium chloride (SLOW-MAG) 64 MG TBEC Take 1 tablet by mouth daily.      . meclizine (ANTIVERT) 12.5 MG tablet Take 1 tablet (12.5 mg total) by mouth 3 (three) times daily as needed for dizziness. 30 tablet 1  . meloxicam (MOBIC) 7.5 MG tablet TAKE 1 TABLET BY MOUTH ONE TO TWO TIMES DAILY AS NEEDED 60 tablet 1  . Multiple Vitamin (MULTIVITAMIN) tablet Take 1 tablet by mouth daily.      . NON FORMULARY Take 1 capsule by mouth daily. Target Brand Probiotic    . NON FORMULARY Place 1 spray under the tongue 2 (two) times daily.    Marland Kitchen PREMARIN vaginal cream USE 2 GRAMS VAGINALLY EVERY NIGHT AT BEDTIME 1 -2 TIMES PER WEEK  6  . Psyllium (METAMUCIL PO) Take by mouth daily.      Marland Kitchen SYNTHROID 88 MCG tablet TAKE 1 TABLET BY MOUTH, EXCEPT TAKE 1/2 TABLET DAILY MONDAYS TUES., THURS., AND SATURDAY. 90 tablet 3   No current facility-administered medications on file prior to visit.     Past Medical History:  Diagnosis Date  . Diverticulosis of colon 2005   Dr Carlean Purl  . Fracture  of ankle, closed 2012   boot, no surgery  . Hyperlipidemia   . Hypertension   . Osteopenia   . Raynaud's phenomenon   . Scoliosis   . Skin cancer    X 2  . Thyroid disease    hypothyroidism    Past Surgical History:  Procedure Laterality Date  . CATARACT EXTRACTION     bilaterally, Dr Katy Fitch  . COLONOSCOPY  2005   Dr Carlean Purl  . DILATION AND CURETTAGE OF UTERUS    . HYSTEROSCOPY     Dr Radene Knee  . I&D EXTREMITY  2004   infection L hand  . MOHS SURGERY  2011   L temple  . MOHS SURGERY  2014   R cheek  . TONSILLECTOMY    . uterine polyp      Social History   Social History  . Marital status: Single    Spouse name: N/A  . Number of children: N/A  . Years of education: N/A   Social History Main Topics  . Smoking status:  Former Smoker    Quit date: 09/06/1982  . Smokeless tobacco: Never Used     Comment: Smoked [phone], up to < 1/2 ppd  . Alcohol use 12.6 oz/week    21 Glasses of wine per week  . Drug use: No  . Sexual activity: Not on file   Other Topics Concern  . Not on file   Social History Narrative  . No narrative on file    Family History  Problem Relation Age of Onset  . Cancer Mother        stomach  . Alzheimer's disease Father   . Cancer Brother        leukemia  . Alcohol abuse Sister   . Alzheimer's disease Paternal Aunt        X 2  . Alzheimer's disease Paternal Grandmother   . Diabetes Neg Hx   . Heart disease Neg Hx   . Stroke Neg Hx   . Colon cancer Neg Hx     Review of Systems  Constitutional: Negative for fever.  Respiratory: Negative for shortness of breath.   Cardiovascular: Negative for chest pain, palpitations and leg swelling.  Neurological: Negative for light-headedness and headaches.       Objective:   Vitals:   03/08/17 1609 03/08/17 1629  BP: (!) 164/88 (!) 144/88  Pulse: (!) 48   Resp: 16   Temp: 98.7 F (37.1 C)    Filed Weights   03/08/17 1609  Weight: 156 lb (70.8 kg)   Body mass index is 24.43 kg/m.  Wt Readings from Last 3 Encounters:  03/08/17 156 lb (70.8 kg)  02/16/17 150 lb 14.4 oz (68.4 kg)  11/11/16 157 lb (71.2 kg)     Physical Exam Constitutional: Appears well-developed and well-nourished. No distress.  HENT:  Head: Normocephalic and atraumatic.  Neck: Neck supple. No tracheal deviation present. No thyromegaly present.  No cervical lymphadenopathy Cardiovascular: Normal rate, regular rhythm and normal heart sounds.   No murmur heard. No carotid bruit .  No edema Pulmonary/Chest: Effort normal and breath sounds normal. No respiratory distress. No has no wheezes. No rales.  Skin: Skin is warm and dry. Not diaphoretic.  Psychiatric: Normal mood and affect. Behavior is normal.       Assessment & Plan:   See Problem List  for Assessment and Plan of chronic medical problems.

## 2017-05-02 DIAGNOSIS — N3281 Overactive bladder: Secondary | ICD-10-CM | POA: Diagnosis not present

## 2017-05-02 DIAGNOSIS — Z6825 Body mass index (BMI) 25.0-25.9, adult: Secondary | ICD-10-CM | POA: Diagnosis not present

## 2017-05-02 DIAGNOSIS — Z124 Encounter for screening for malignant neoplasm of cervix: Secondary | ICD-10-CM | POA: Diagnosis not present

## 2017-05-03 ENCOUNTER — Encounter: Payer: Self-pay | Admitting: Obstetrics and Gynecology

## 2017-05-17 DIAGNOSIS — H0014 Chalazion left upper eyelid: Secondary | ICD-10-CM | POA: Diagnosis not present

## 2017-07-12 ENCOUNTER — Ambulatory Visit (INDEPENDENT_AMBULATORY_CARE_PROVIDER_SITE_OTHER): Payer: PPO | Admitting: General Practice

## 2017-07-12 DIAGNOSIS — Z23 Encounter for immunization: Secondary | ICD-10-CM | POA: Diagnosis not present

## 2017-07-15 ENCOUNTER — Ambulatory Visit: Payer: PPO | Admitting: Internal Medicine

## 2017-07-18 DIAGNOSIS — L821 Other seborrheic keratosis: Secondary | ICD-10-CM | POA: Diagnosis not present

## 2017-07-18 DIAGNOSIS — D225 Melanocytic nevi of trunk: Secondary | ICD-10-CM | POA: Diagnosis not present

## 2017-07-18 DIAGNOSIS — D1801 Hemangioma of skin and subcutaneous tissue: Secondary | ICD-10-CM | POA: Diagnosis not present

## 2017-07-18 DIAGNOSIS — I788 Other diseases of capillaries: Secondary | ICD-10-CM | POA: Diagnosis not present

## 2017-07-18 DIAGNOSIS — Z85828 Personal history of other malignant neoplasm of skin: Secondary | ICD-10-CM | POA: Diagnosis not present

## 2017-07-27 NOTE — Progress Notes (Deleted)
Subjective:   Tara Bridges is a 81 y.o. female who presents for Medicare Annual (Subsequent) preventive examination.  Review of Systems:  No ROS.  Medicare Wellness Visit. Additional risk factors are reflected in the social history.    Sleep patterns: {SX; SLEEP PATTERNS:18802::"feels rested on waking","does not get up to void","gets up *** times nightly to void","sleeps *** hours nightly"}.    Home Safety/Smoke Alarms: Feels safe in home. Smoke alarms in place.  Living environment; residence and Firearm Safety: {Rehab home environment / accessibility:30080::"no firearms","firearms stored safely"}. Seat Belt Safety/Bike Helmet: Wears seat belt.     Objective:     Vitals: There were no vitals taken for this visit.  There is no height or weight on file to calculate BMI.   Tobacco Social History   Tobacco Use  Smoking Status Former Smoker  . Last attempt to quit: 09/06/1982  . Years since quitting: 34.9  Smokeless Tobacco Never Used  Tobacco Comment   Smoked [phone], up to < 1/2 ppd     Counseling given: Not Answered Comment: Smoked [phone], up to < 1/2 ppd   Past Medical History:  Diagnosis Date  . Diverticulosis of colon 2005   Dr Carlean Purl  . Fracture of ankle, closed 2012   boot, no surgery  . Hyperlipidemia   . Hypertension   . Osteopenia   . Raynaud's phenomenon   . Scoliosis   . Skin cancer    X 2  . Thyroid disease    hypothyroidism   Past Surgical History:  Procedure Laterality Date  . CATARACT EXTRACTION     bilaterally, Dr Katy Fitch  . COLONOSCOPY  2005   Dr Carlean Purl  . DILATION AND CURETTAGE OF UTERUS    . HYSTEROSCOPY     Dr Radene Knee  . I&D EXTREMITY  2004   infection L hand  . MOHS SURGERY  2011   L temple  . MOHS SURGERY  2014   R cheek  . TONSILLECTOMY    . uterine polyp     Family History  Problem Relation Age of Onset  . Cancer Mother        stomach  . Alzheimer's disease Father   . Cancer Brother        leukemia  . Alcohol  abuse Sister   . Alzheimer's disease Paternal Aunt        X 2  . Alzheimer's disease Paternal Grandmother   . Diabetes Neg Hx   . Heart disease Neg Hx   . Stroke Neg Hx   . Colon cancer Neg Hx    Social History   Substance and Sexual Activity  Sexual Activity Not on file    Outpatient Encounter Medications as of 08/01/2017  Medication Sig  . amLODipine (NORVASC) 2.5 MG tablet Take 1 tablet (2.5 mg total) by mouth daily.  . Biotin 300 MCG TABS Take by mouth daily.    . Calcium Carbonate-Vit D-Min (CALCIUM 1200 PO) Take by mouth. Viactiv   . Cholecalciferol (VITAMIN D-3) 5000 UNITS TABS Take by mouth daily.    . cycloSPORINE (RESTASIS) 0.05 % ophthalmic emulsion Place 1 drop into both eyes 2 (two) times daily.    . diazepam (VALIUM) 5 MG tablet Take 30 minutes to 1 hour prior to MRI, may repeat x 1 if needed  . KRILL OIL PO Take 2 capsules by mouth daily.   Marland Kitchen lisinopril (PRINIVIL,ZESTRIL) 10 MG tablet TAKE ONE TABLET BY MOUTH ONE TIME DAILY  . magnesium chloride (SLOW-MAG)  64 MG TBEC Take 1 tablet by mouth daily.    . meclizine (ANTIVERT) 12.5 MG tablet Take 1 tablet (12.5 mg total) by mouth 3 (three) times daily as needed for dizziness.  . meloxicam (MOBIC) 7.5 MG tablet TAKE 1 TABLET BY MOUTH ONE TO TWO TIMES DAILY AS NEEDED  . Multiple Vitamin (MULTIVITAMIN) tablet Take 1 tablet by mouth daily.    . NON FORMULARY Take 1 capsule by mouth daily. Target Brand Probiotic  . NON FORMULARY Place 1 spray under the tongue 2 (two) times daily.  Marland Kitchen PREMARIN vaginal cream USE 2 GRAMS VAGINALLY EVERY NIGHT AT BEDTIME 1 -2 TIMES PER WEEK  . Psyllium (METAMUCIL PO) Take by mouth daily.    Marland Kitchen SYNTHROID 88 MCG tablet TAKE 1 TABLET BY MOUTH, EXCEPT TAKE 1/2 TABLET DAILY MONDAYS TUES., THURS., AND SATURDAY.   No facility-administered encounter medications on file as of 08/01/2017.     Activities of Daily Living No flowsheet data found.  Patient Care Team: Binnie Rail, MD as PCP - General  (Internal Medicine) Clent Jacks, MD as Consulting Physician (Ophthalmology) Dr. Randol Kern (Dentistry) Arvella Nigh, MD as Consulting Physician (Obstetrics and Gynecology)    Assessment:    Physical assessment deferred to PCP.  Exercise Activities and Dietary recommendations   Diet (meal preparation, eat out, water intake, caffeinated beverages, dairy products, fruits and vegetables): {Desc; diets:16563}   Goals    None     Fall Risk Fall Risk  07/20/2016 07/12/2016 06/18/2013  Falls in the past year? No No No   Depression Screen PHQ 2/9 Scores 07/20/2016 07/12/2016 06/18/2013  PHQ - 2 Score 0 0 0     Cognitive Function        Immunization History  Administered Date(s) Administered  . Influenza Split 06/08/2011, 06/12/2012  . Influenza Whole 06/05/2010  . Influenza, High Dose Seasonal PF 06/18/2013, 07/07/2015, 07/12/2016, 07/12/2017  . Influenza,inj,Quad PF,6+ Mos 07/01/2014  . Pneumococcal Conjugate-13 10/12/2016  . Rabies, IM 02/16/2017, 02/19/2017, 02/23/2017, 03/02/2017  . Tdap 02/16/2017   Screening Tests Health Maintenance  Topic Date Due  . PNA vac Low Risk Adult (2 of 2 - PPSV23) 10/12/2017  . TETANUS/TDAP  02/17/2027  . INFLUENZA VACCINE  Completed  . DEXA SCAN  Completed      Plan:     I have personally reviewed and noted the following in the patient's chart:   . Medical and social history . Use of alcohol, tobacco or illicit drugs  . Current medications and supplements . Functional ability and status . Nutritional status . Physical activity . Advanced directives . List of other physicians . Vitals . Screenings to include cognitive, depression, and falls . Referrals and appointments  In addition, I have reviewed and discussed with patient certain preventive protocols, quality metrics, and best practice recommendations. A written personalized care plan for preventive services as well as general preventive health recommendations were provided  to patient.     Michiel Cowboy, RN  07/27/2017

## 2017-07-31 NOTE — Progress Notes (Signed)
Subjective:    Patient ID: Tara Bridges, female    DOB: December 18, 1935, 81 y.o.   MRN: [id number]  HPI The patient is here for follow up.  Fatigue, achy:  She feeling like she is coming down with something.  She doesn't feel great.  She is a little achy and has a headache.  Her energy level is a little lower.  It often feels it in the afternoon.  Advil seems to help.    Hypothyroidism:  She is taking her medication daily.  She denies any recent changes in weight that are unexplained. She is a little more fatigued.    Hypertension: She is taking her medication daily. She is compliant with a low sodium diet.  She denies chest pain, palpitations, edema, shortness of breath and regular headaches. She is exercising regularly - lifts weights once a week, walking daily.  She does monitor her blood pressure at home - 122/76, 119/72, 104/66.    Hyperlipidemia: She is not on any medication at this time.  She was on Lipitor in the past, but stopped it due to concerns over possible side effects.  She did not experience any side effects. She is compliant with a low fat/cholesterol diet. She is exercising regularly - once a week lifts weights, walking daily.     Medications and allergies reviewed with patient and updated if appropriate.  Patient Active Problem List   Diagnosis Date Noted  . Allergic rhinitis 11/11/2016  . Degeneration, intervertebral disc, lumbar 10/18/2016  . Positional vertigo of right ear 10/12/2016  . Unsteady 10/12/2016  . Vitamin D deficiency 06/24/2013  . Osteopenia 06/05/2010  . NONSPECIFIC ABNORMAL ELECTROCARDIOGRAM 06/05/2010  . History of skin cancer 06/05/2010  . Diverticulosis of colon without hemorrhage 02/24/2009  . Hyperlipidemia 02/20/2008  . Essential hypertension 02/20/2008  . Hypothyroidism 02/06/2007  . Raynaud phenomenon 02/06/2007    Current Outpatient Medications on File Prior to Visit  Medication Sig Dispense Refill  . Biotin 300 MCG TABS Take by  mouth daily.      . Calcium Carbonate-Vit D-Min (CALCIUM 1200 PO) Take by mouth. Viactiv     . Cholecalciferol (VITAMIN D-3) 5000 UNITS TABS Take by mouth daily.      . cycloSPORINE (RESTASIS) 0.05 % ophthalmic emulsion Place 1 drop into both eyes 2 (two) times daily.      Marland Kitchen KRILL OIL PO Take 2 capsules by mouth daily.     Marland Kitchen lisinopril (PRINIVIL,ZESTRIL) 10 MG tablet TAKE ONE TABLET BY MOUTH ONE TIME DAILY 90 tablet 3  . magnesium chloride (SLOW-MAG) 64 MG TBEC Take 1 tablet by mouth daily.      . meclizine (ANTIVERT) 12.5 MG tablet Take 1 tablet (12.5 mg total) by mouth 3 (three) times daily as needed for dizziness. 30 tablet 1  . meloxicam (MOBIC) 7.5 MG tablet TAKE 1 TABLET BY MOUTH ONE TO TWO TIMES DAILY AS NEEDED 60 tablet 1  . Multiple Vitamin (MULTIVITAMIN) tablet Take 1 tablet by mouth daily.      . NON FORMULARY Take 1 capsule by mouth daily. Target Brand Probiotic    . NON FORMULARY Place 1 spray under the tongue 2 (two) times daily.    Marland Kitchen PREMARIN vaginal cream USE 2 GRAMS VAGINALLY EVERY NIGHT AT BEDTIME 1 -2 TIMES PER WEEK  6  . Psyllium (METAMUCIL PO) Take by mouth daily.      Marland Kitchen SYNTHROID 88 MCG tablet TAKE 1 TABLET BY MOUTH, EXCEPT TAKE 1/2 TABLET DAILY MONDAYS  TUES., THURS., AND SATURDAY. 90 tablet 3   No current facility-administered medications on file prior to visit.     Past Medical History:  Diagnosis Date  . Diverticulosis of colon 2005   Dr Carlean Purl  . Fracture of ankle, closed 2012   boot, no surgery  . Hyperlipidemia   . Hypertension   . Osteopenia   . Raynaud's phenomenon   . Scoliosis   . Skin cancer    X 2  . Thyroid disease    hypothyroidism    Past Surgical History:  Procedure Laterality Date  . CATARACT EXTRACTION     bilaterally, Dr Katy Fitch  . COLONOSCOPY  2005   Dr Carlean Purl  . DILATION AND CURETTAGE OF UTERUS    . HYSTEROSCOPY     Dr Radene Knee  . I&D EXTREMITY  2004   infection L hand  . MOHS SURGERY  2011   L temple  . MOHS SURGERY  2014   R  cheek  . TONSILLECTOMY    . uterine polyp      Social History   Socioeconomic History  . Marital status: Single    Spouse name: None  . Number of children: None  . Years of education: None  . Highest education level: None  Social Needs  . Financial resource strain: None  . Food insecurity - worry: None  . Food insecurity - inability: None  . Transportation needs - medical: None  . Transportation needs - non-medical: None  Occupational History  . None  Tobacco Use  . Smoking status: Former Smoker    Last attempt to quit: 09/06/1982    Years since quitting: 34.9  . Smokeless tobacco: Never Used  . Tobacco comment: Smoked [id number], up to < 1/2 ppd  Substance and Sexual Activity  . Alcohol use: Yes    Alcohol/week: 12.6 oz    Types: 21 Glasses of wine per week  . Drug use: No  . Sexual activity: None  Other Topics Concern  . None  Social History Narrative  . None    Family History  Problem Relation Age of Onset  . Cancer Mother        stomach  . Alzheimer's disease Father   . Cancer Brother        leukemia  . Alcohol abuse Sister   . Alzheimer's disease Paternal Aunt        X 2  . Alzheimer's disease Paternal Grandmother   . Diabetes Neg Hx   . Heart disease Neg Hx   . Stroke Neg Hx   . Colon cancer Neg Hx     Review of Systems  Constitutional: Positive for diaphoresis (at night) and unexpected weight change. Negative for chills and fever.  Respiratory: Negative for cough, shortness of breath and wheezing.   Cardiovascular: Negative for chest pain, palpitations and leg swelling.  Musculoskeletal:       Overall mild achiness  Neurological: Positive for headaches (feels like headaches coming on - very mild). Negative for light-headedness.       Objective:   Vitals:   08/01/17 1339  BP: (!) 150/78  Pulse: 62  Resp: 16  Temp: 98.2 F (36.8 C)  SpO2: 98%   Wt Readings from Last 3 Encounters:  08/01/17 153 lb (69.4 kg)  03/08/17 156 lb (70.8 kg)    02/16/17 150 lb 14.4 oz (68.4 kg)   Body mass index is 23.96 kg/m.   Physical Exam    Constitutional: Appears well-developed and well-nourished. No  distress.  HENT:  Head: Normocephalic and atraumatic.  Neck: Neck supple. No tracheal deviation present. No thyromegaly present.  No cervical lymphadenopathy Cardiovascular: Normal rate, regular rhythm and normal heart sounds.   No murmur heard. No carotid bruit .  No edema Pulmonary/Chest: Effort normal and breath sounds normal. No respiratory distress. No has no wheezes. No rales.  Skin: Skin is warm and dry. Not diaphoretic.  Psychiatric: Normal mood and affect. Behavior is normal.      Assessment & Plan:    See Problem List for Assessment and Plan of chronic medical problems.

## 2017-07-31 NOTE — Patient Instructions (Signed)
Have blood work done

## 2017-08-01 ENCOUNTER — Other Ambulatory Visit (INDEPENDENT_AMBULATORY_CARE_PROVIDER_SITE_OTHER): Payer: PPO

## 2017-08-01 ENCOUNTER — Ambulatory Visit: Payer: PPO | Admitting: Internal Medicine

## 2017-08-01 ENCOUNTER — Encounter: Payer: Self-pay | Admitting: Internal Medicine

## 2017-08-01 VITALS — BP 150/78 | HR 62 | Temp 98.2°F | Resp 16 | Wt 153.0 lb

## 2017-08-01 DIAGNOSIS — E7849 Other hyperlipidemia: Secondary | ICD-10-CM

## 2017-08-01 DIAGNOSIS — R5383 Other fatigue: Secondary | ICD-10-CM

## 2017-08-01 DIAGNOSIS — I10 Essential (primary) hypertension: Secondary | ICD-10-CM

## 2017-08-01 DIAGNOSIS — E039 Hypothyroidism, unspecified: Secondary | ICD-10-CM | POA: Diagnosis not present

## 2017-08-01 LAB — CBC WITH DIFFERENTIAL/PLATELET
BASOS ABS: 0.1 10*3/uL (ref 0.0–0.1)
Basophils Relative: 1.4 % (ref 0.0–3.0)
EOS ABS: 0.2 10*3/uL (ref 0.0–0.7)
Eosinophils Relative: 2.2 % (ref 0.0–5.0)
HCT: 38.8 % (ref 36.0–46.0)
Hemoglobin: 12.6 g/dL (ref 12.0–15.0)
LYMPHS ABS: 1.6 10*3/uL (ref 0.7–4.0)
Lymphocytes Relative: 22.4 % (ref 12.0–46.0)
MCHC: 32.5 g/dL (ref 30.0–36.0)
MCV: 94 fl (ref 78.0–100.0)
MONOS PCT: 9.1 % (ref 3.0–12.0)
Monocytes Absolute: 0.7 10*3/uL (ref 0.1–1.0)
NEUTROS PCT: 64.9 % (ref 43.0–77.0)
Neutro Abs: 4.6 10*3/uL (ref 1.4–7.7)
PLATELETS: 316 10*3/uL (ref 150.0–400.0)
RBC: 4.13 Mil/uL (ref 3.87–5.11)
RDW: 13 % (ref 11.5–15.5)
WBC: 7.1 10*3/uL (ref 4.0–10.5)

## 2017-08-01 LAB — COMPREHENSIVE METABOLIC PANEL
ALBUMIN: 3.9 g/dL (ref 3.5–5.2)
ALK PHOS: 58 U/L (ref 39–117)
ALT: 9 U/L (ref 0–35)
AST: 13 U/L (ref 0–37)
BILIRUBIN TOTAL: 0.4 mg/dL (ref 0.2–1.2)
BUN: 20 mg/dL (ref 6–23)
CO2: 29 mEq/L (ref 19–32)
Calcium: 9.9 mg/dL (ref 8.4–10.5)
Chloride: 100 mEq/L (ref 96–112)
Creatinine, Ser: 0.85 mg/dL (ref 0.40–1.20)
GFR: 68.49 mL/min (ref >60.00)
Glucose, Bld: 90 mg/dL (ref 70–99)
POTASSIUM: 4.5 meq/L (ref 3.5–5.1)
Sodium: 137 mEq/L (ref 135–145)
TOTAL PROTEIN: 7.3 g/dL (ref 6.0–8.3)

## 2017-08-01 LAB — LIPID PANEL
CHOLESTEROL: 190 mg/dL (ref 0–200)
HDL: 60 mg/dL (ref >39.00)
LDL Cholesterol: 113 mg/dL — ABNORMAL HIGH (ref 0–99)
NonHDL: 130.02
TRIGLYCERIDES: 83 mg/dL (ref 0.0–149.0)
Total CHOL/HDL Ratio: 3
VLDL: 16.6 mg/dL (ref 0.0–40.0)

## 2017-08-01 LAB — TSH: TSH: 1.93 u[IU]/mL (ref 0.35–4.50)

## 2017-08-01 NOTE — Assessment & Plan Note (Signed)
BP slightly elevated today, but has been very good at home Continue current medications for now Continue to monitor at now cmp

## 2017-08-01 NOTE — Assessment & Plan Note (Signed)
Mild in nature - associated with achiness Check labs - tsh, cbc, cmp

## 2017-08-01 NOTE — Assessment & Plan Note (Addendum)
Check lipid panel  Regular exercise and healthy diet encouraged  

## 2017-08-01 NOTE — Assessment & Plan Note (Signed)
Having increased fatigue Check tsh  Titrate med dose if needed

## 2017-08-08 DIAGNOSIS — Z1231 Encounter for screening mammogram for malignant neoplasm of breast: Secondary | ICD-10-CM | POA: Diagnosis not present

## 2017-08-08 LAB — HM MAMMOGRAPHY

## 2017-08-11 ENCOUNTER — Encounter: Payer: Self-pay | Admitting: Internal Medicine

## 2017-09-01 ENCOUNTER — Telehealth: Payer: Self-pay | Admitting: Internal Medicine

## 2017-09-01 DIAGNOSIS — E039 Hypothyroidism, unspecified: Secondary | ICD-10-CM

## 2017-09-01 NOTE — Telephone Encounter (Signed)
Ok - I have referred her.

## 2017-09-01 NOTE — Telephone Encounter (Signed)
Copied from [place] [phone]. Topic: Referral - Request >> Sep 01, 2017  2:59 PM Vernona Rieger wrote: Reason for CRM:  Pt is requesting to see Dr Forde Dandy Endocrinologist. If she doesn't want to do this, she said she will come back in. She states she still isnt feeling right. She thinks her body is fighting something, wakes up in sweat. Call back for her is [phone] either way.

## 2017-09-01 NOTE — Telephone Encounter (Signed)
Spoke with pt to inform.  

## 2017-09-12 DIAGNOSIS — N390 Urinary tract infection, site not specified: Secondary | ICD-10-CM | POA: Diagnosis not present

## 2017-09-15 ENCOUNTER — Other Ambulatory Visit: Payer: Self-pay | Admitting: Internal Medicine

## 2017-09-15 DIAGNOSIS — I10 Essential (primary) hypertension: Secondary | ICD-10-CM

## 2017-09-16 DIAGNOSIS — A6920 Lyme disease, unspecified: Secondary | ICD-10-CM | POA: Diagnosis not present

## 2017-09-26 DIAGNOSIS — N390 Urinary tract infection, site not specified: Secondary | ICD-10-CM | POA: Diagnosis not present

## 2017-09-26 DIAGNOSIS — R509 Fever, unspecified: Secondary | ICD-10-CM | POA: Diagnosis not present

## 2017-10-05 ENCOUNTER — Other Ambulatory Visit: Payer: Self-pay | Admitting: Internal Medicine

## 2017-10-05 DIAGNOSIS — E038 Other specified hypothyroidism: Secondary | ICD-10-CM

## 2017-10-11 DIAGNOSIS — E785 Hyperlipidemia, unspecified: Secondary | ICD-10-CM | POA: Diagnosis not present

## 2017-10-11 DIAGNOSIS — Z1389 Encounter for screening for other disorder: Secondary | ICD-10-CM | POA: Diagnosis not present

## 2017-10-11 DIAGNOSIS — I499 Cardiac arrhythmia, unspecified: Secondary | ICD-10-CM | POA: Diagnosis not present

## 2017-10-11 DIAGNOSIS — E038 Other specified hypothyroidism: Secondary | ICD-10-CM | POA: Diagnosis not present

## 2017-10-11 DIAGNOSIS — M859 Disorder of bone density and structure, unspecified: Secondary | ICD-10-CM | POA: Diagnosis not present

## 2017-10-11 DIAGNOSIS — R7982 Elevated C-reactive protein (CRP): Secondary | ICD-10-CM | POA: Diagnosis not present

## 2017-10-11 DIAGNOSIS — R82998 Other abnormal findings in urine: Secondary | ICD-10-CM | POA: Diagnosis not present

## 2017-10-11 DIAGNOSIS — Z6823 Body mass index (BMI) 23.0-23.9, adult: Secondary | ICD-10-CM | POA: Diagnosis not present

## 2017-10-11 DIAGNOSIS — I7300 Raynaud's syndrome without gangrene: Secondary | ICD-10-CM | POA: Diagnosis not present

## 2017-10-11 DIAGNOSIS — R51 Headache: Secondary | ICD-10-CM | POA: Diagnosis not present

## 2017-10-11 DIAGNOSIS — E559 Vitamin D deficiency, unspecified: Secondary | ICD-10-CM | POA: Diagnosis not present

## 2017-10-11 DIAGNOSIS — I10 Essential (primary) hypertension: Secondary | ICD-10-CM | POA: Diagnosis not present

## 2017-10-11 DIAGNOSIS — R61 Generalized hyperhidrosis: Secondary | ICD-10-CM | POA: Diagnosis not present

## 2017-10-11 DIAGNOSIS — K5730 Diverticulosis of large intestine without perforation or abscess without bleeding: Secondary | ICD-10-CM | POA: Diagnosis not present

## 2017-10-11 DIAGNOSIS — R918 Other nonspecific abnormal finding of lung field: Secondary | ICD-10-CM | POA: Diagnosis not present

## 2017-10-11 DIAGNOSIS — E7849 Other hyperlipidemia: Secondary | ICD-10-CM | POA: Diagnosis not present

## 2017-10-16 ENCOUNTER — Other Ambulatory Visit: Payer: Self-pay | Admitting: Endocrinology

## 2017-10-16 DIAGNOSIS — R9389 Abnormal findings on diagnostic imaging of other specified body structures: Secondary | ICD-10-CM

## 2017-10-16 DIAGNOSIS — R51 Headache: Secondary | ICD-10-CM

## 2017-10-16 DIAGNOSIS — R519 Headache, unspecified: Secondary | ICD-10-CM

## 2017-10-21 ENCOUNTER — Other Ambulatory Visit: Payer: PPO

## 2017-10-21 ENCOUNTER — Ambulatory Visit
Admission: RE | Admit: 2017-10-21 | Discharge: 2017-10-21 | Disposition: A | Discharge status: Home / Self Care | Payer: PPO | Source: Ambulatory Visit | Attending: Endocrinology | Admitting: Endocrinology

## 2017-10-21 DIAGNOSIS — R51 Headache: Principal | ICD-10-CM

## 2017-10-21 DIAGNOSIS — J9811 Atelectasis: Secondary | ICD-10-CM | POA: Diagnosis not present

## 2017-10-21 DIAGNOSIS — R519 Headache, unspecified: Secondary | ICD-10-CM

## 2017-10-21 DIAGNOSIS — R9389 Abnormal findings on diagnostic imaging of other specified body structures: Secondary | ICD-10-CM

## 2017-10-21 IMAGING — CT CT HEAD W/O CM
1 series · 15 of 30 positions shown, 19 images · non-contrast
Comparison: None.

CLINICAL DATA: Headache for 6 months.  Fatigue.

EXAM:
CT HEAD WITHOUT CONTRAST
TECHNIQUE: Contiguous axial images were obtained from the base of the skull
through the vertex without intravenous contrast.

[Series 2: head w/(date) · axial · 0.43mm/px · z∈[-197,-47]mm · 15 of 34 slices shown, 19 images]
[im 2/34  brain]
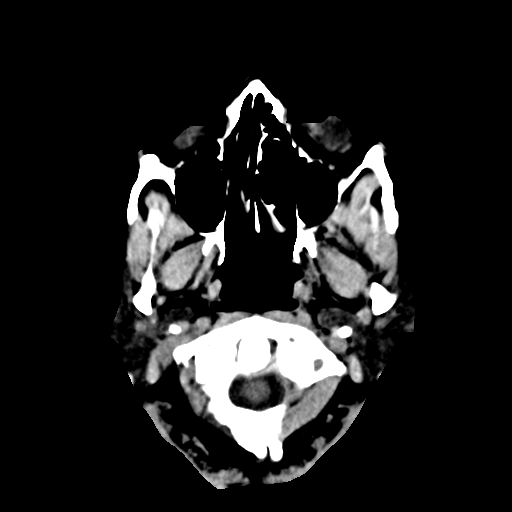
[im 2/34  bone]
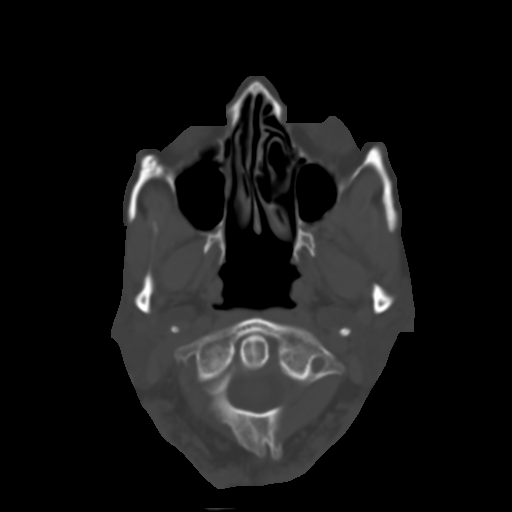
[im 4/34  brain]
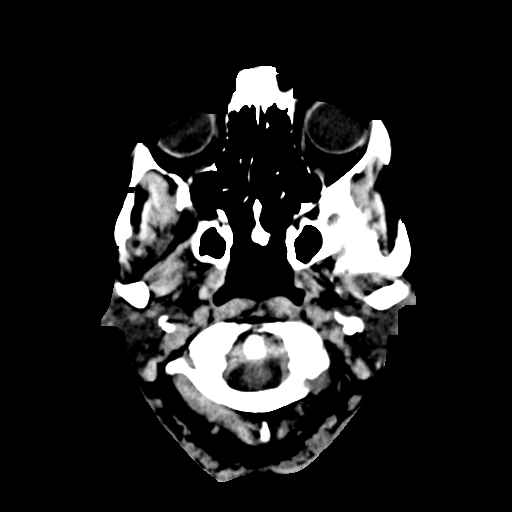
[im 6/34  brain]
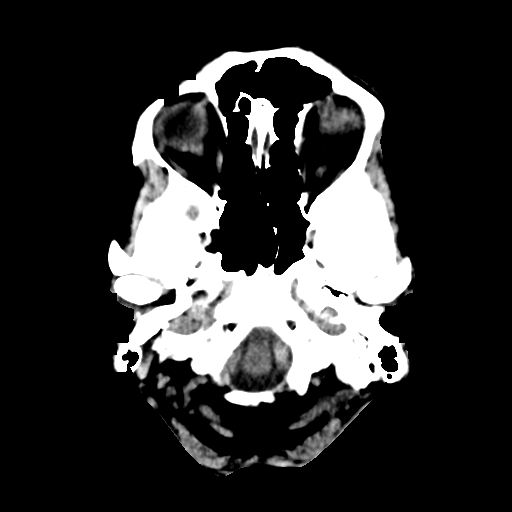
[im 8/34  brain]
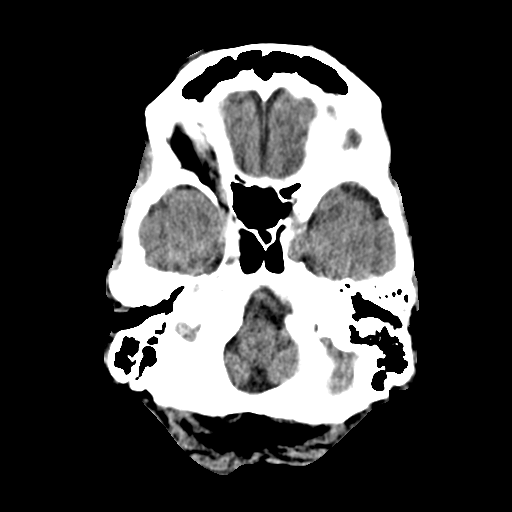
[im 11/34  brain]
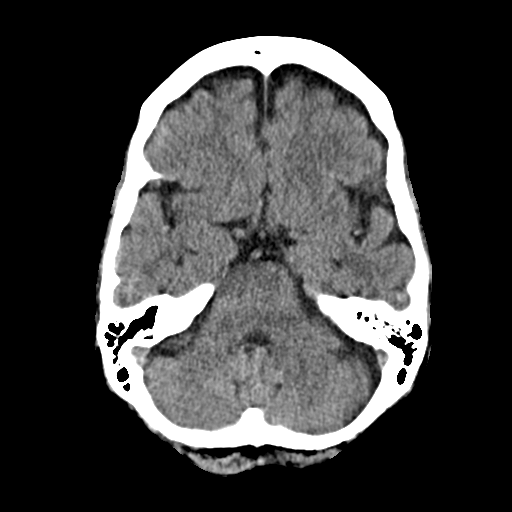
[im 11/34  bone]
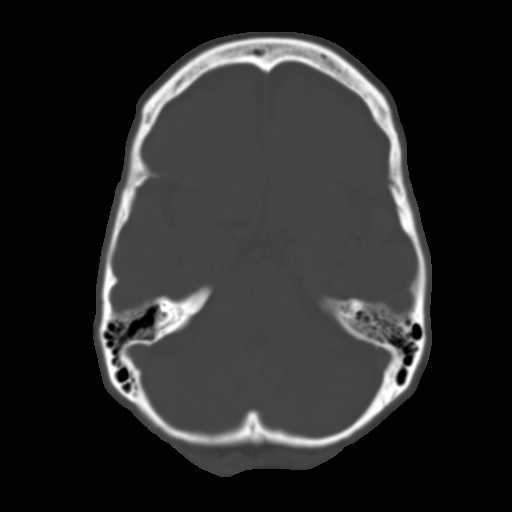
[im 13/34  brain]
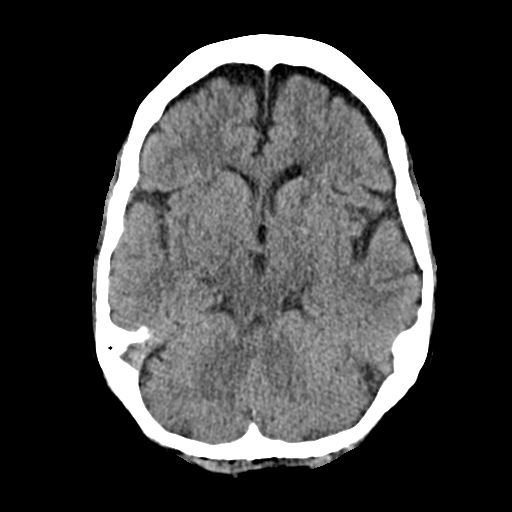
[im 15/34  brain]
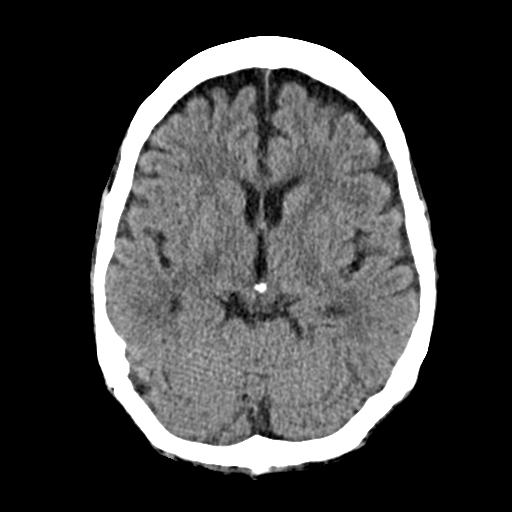
[im 18/34  brain]
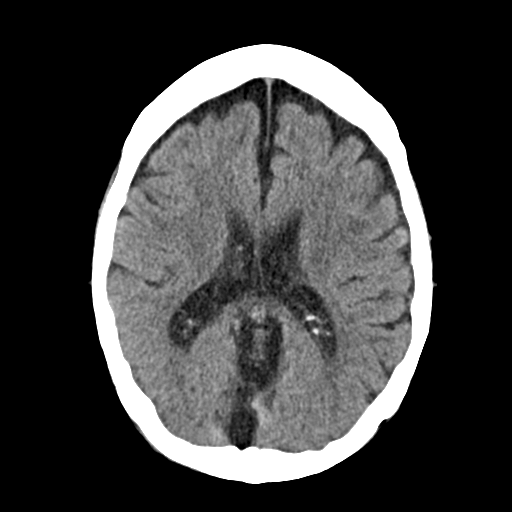
[im 19/34  brain]
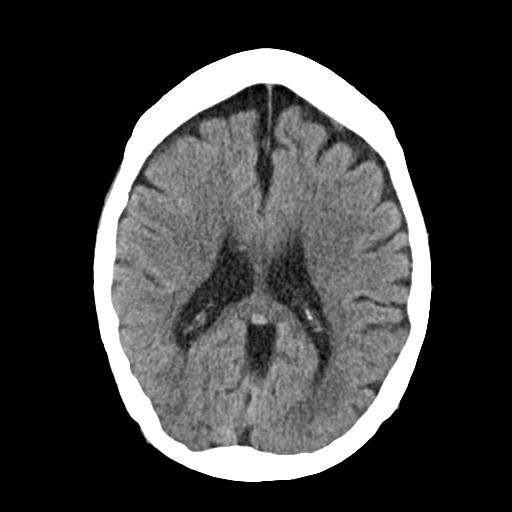
[im 19/34  bone]
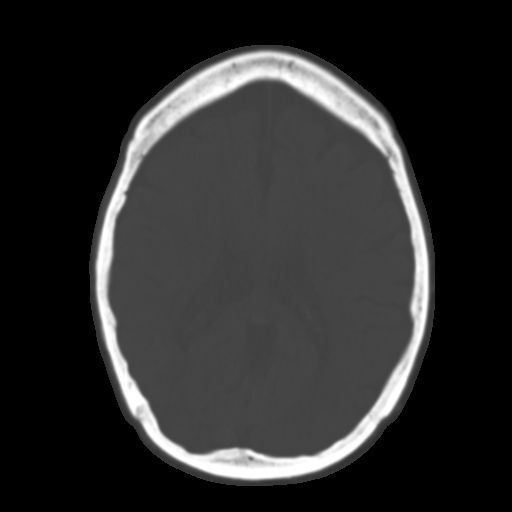
[im 21/34  brain]
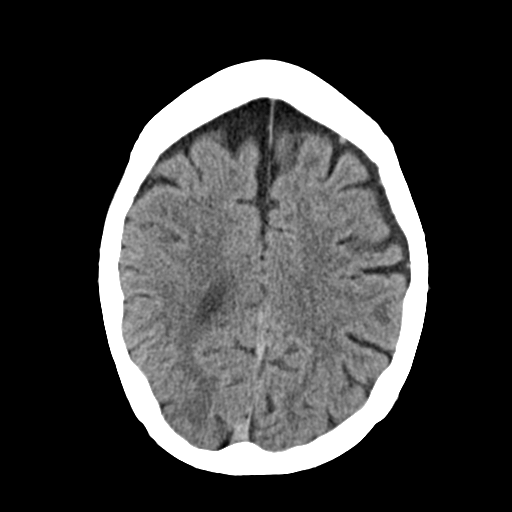
[im 23/34  brain]
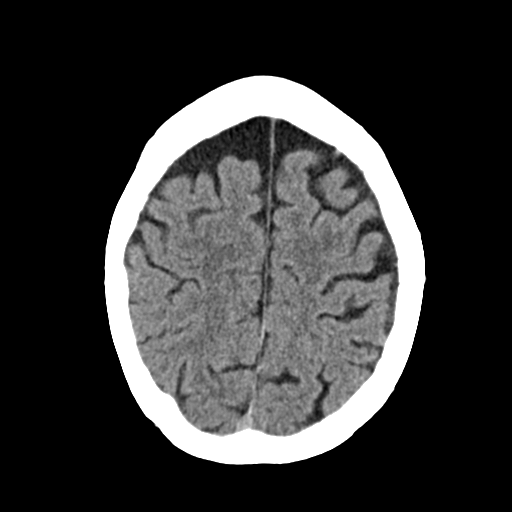
[im 26/34  brain]
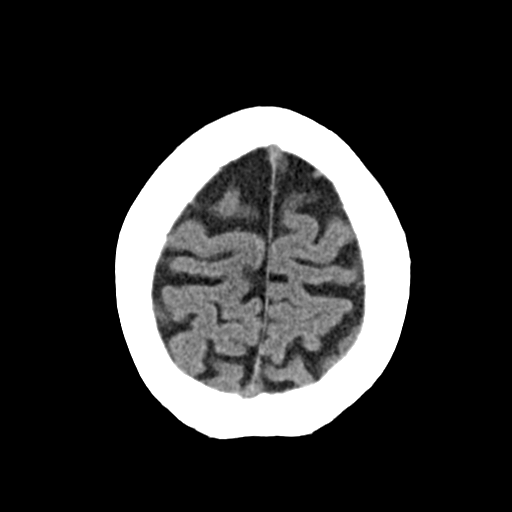
[im 28/34  brain]
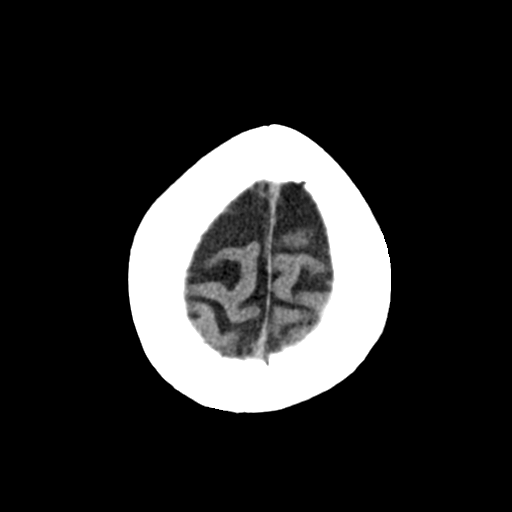
[im 28/34  bone]
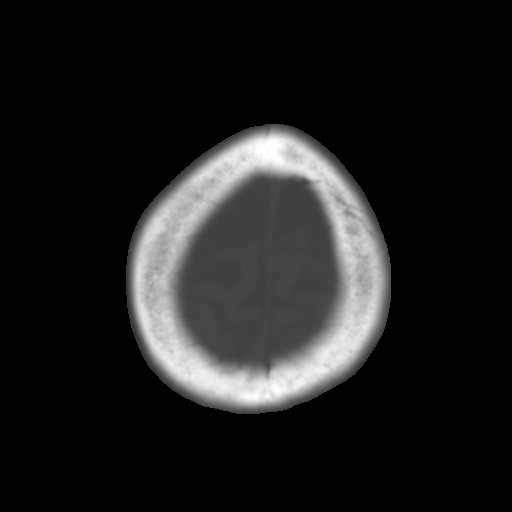
[im 30/34  brain]
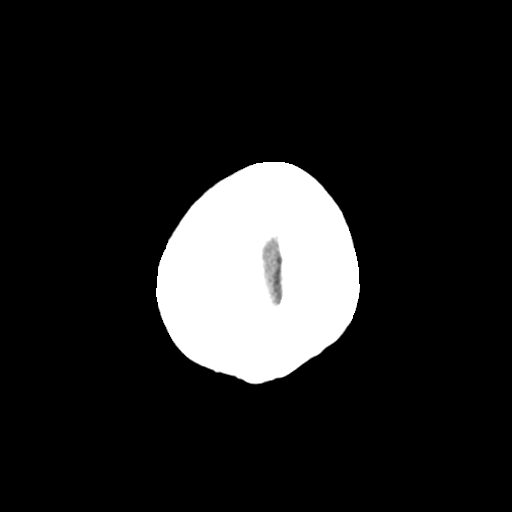
[im 32/34  brain]
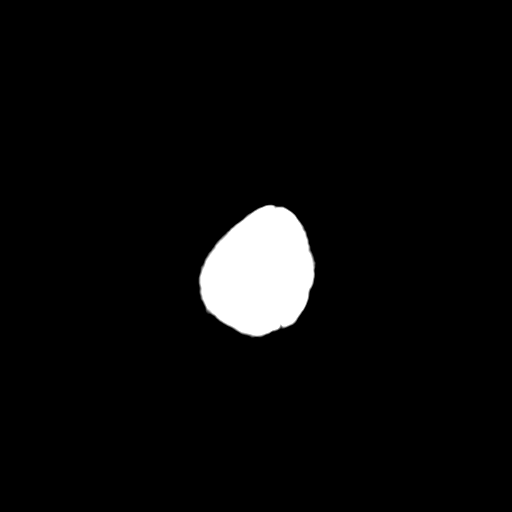

[15 of 30 positions shown; findings below may reference images not displayed]

FINDINGS: BRAIN: There is sulcal prominence consistent with superficial
atrophy. No intraparenchymal hemorrhage, mass effect nor midline
shift. Periventricular and subcortical white matter hypodensities
consistent with chronic small vessel ischemic disease are
identified. No acute large vascular territory infarcts. No abnormal
extra-axial fluid collections. Basal cisterns are not effaced and
midline.

VASCULAR: Moderate calcific atherosclerosis of the carotid siphons.

SKULL: No skull fracture. No significant scalp soft tissue swelling.

SINUSES/ORBITS: The mastoid air-cells are clear. The included
paranasal sinuses are well-aerated.The included ocular globes and
orbital contents are non-suspicious.

OTHER: None.
IMPRESSION: Superficial atrophy with chronic appearing small vessel ischemic
disease. No acute intracranial abnormality.

## 2017-10-21 IMAGING — CT CT CHEST W/ CM
1 series · 15 of 34 positions shown, 19 images · IV contrast (APPLIED)
Comparison: None.

CLINICAL DATA: Fatigue, abnormal chest radiograph. Headache for 6
months.

EXAM:
CT CHEST WITH CONTRAST
TECHNIQUE: Multidetector CT imaging of the chest was performed during
intravenous contrast administration.
CONTRAST:  75mL G4TRII-QLL IOPAMIDOL (G4TRII-QLL) INJECTION 61%

[Series 2: chest w/cm · axial · 0.70mm/px · z∈[-280,-28]mm · 15 of 150 slices shown, 19 images]
[im 12/150  mediastinal]
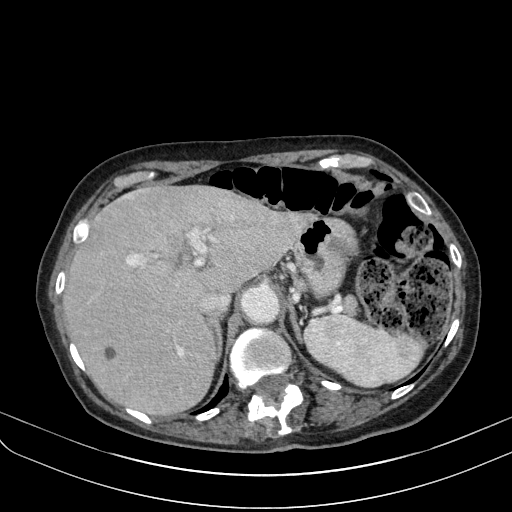
[im 12/150  lung]
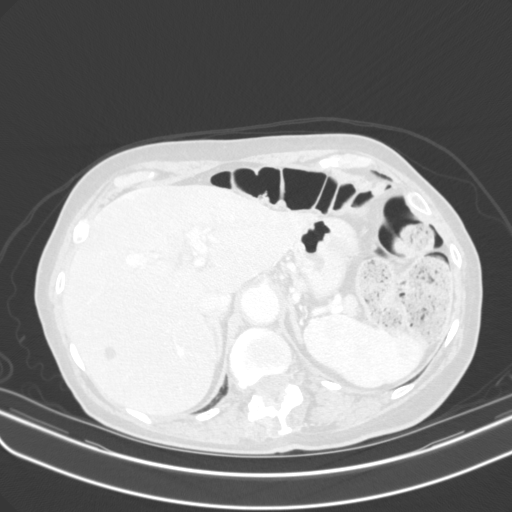
[im 23/150  lung]
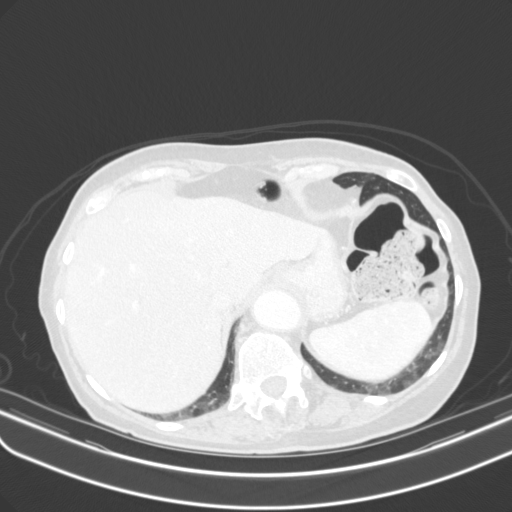
[im 30/150  lung]
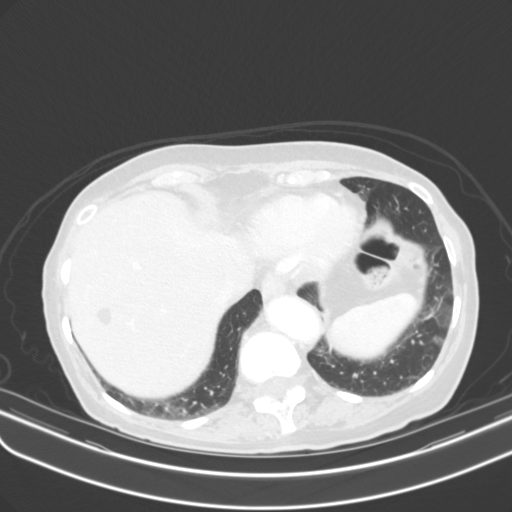
[im 39/150  lung]
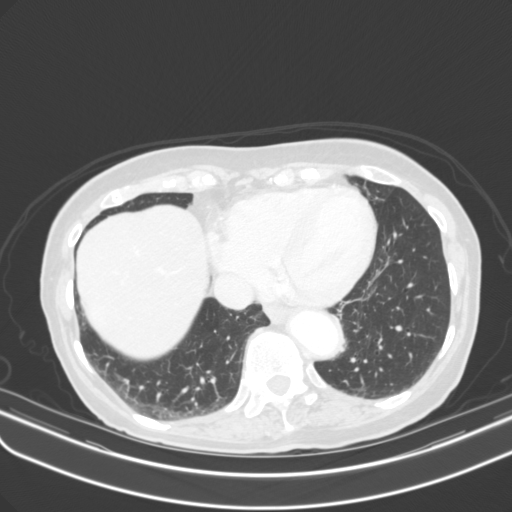
[im 50/150  mediastinal]
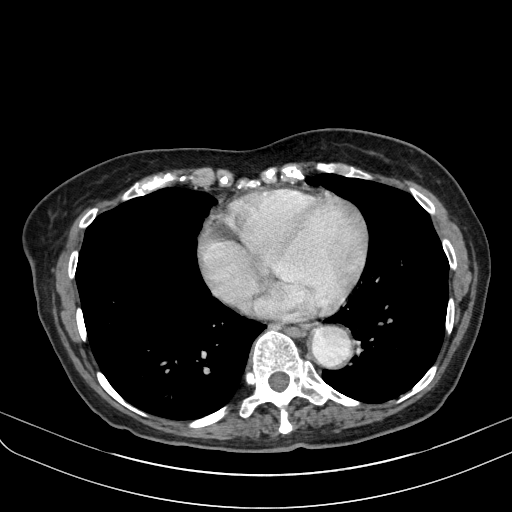
[im 50/150  lung]
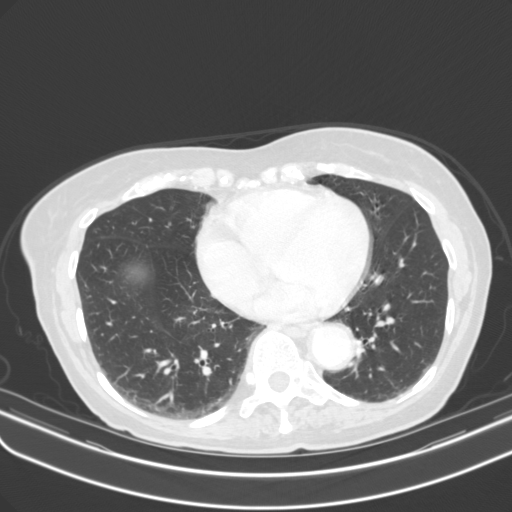
[im 60/150  lung]
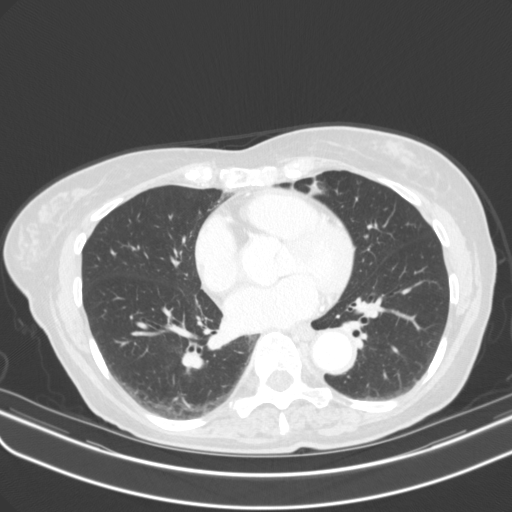
[im 67/150  lung]
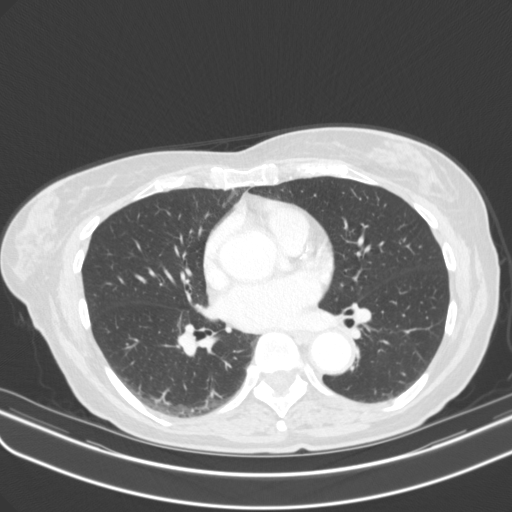
[im 78/150  lung]
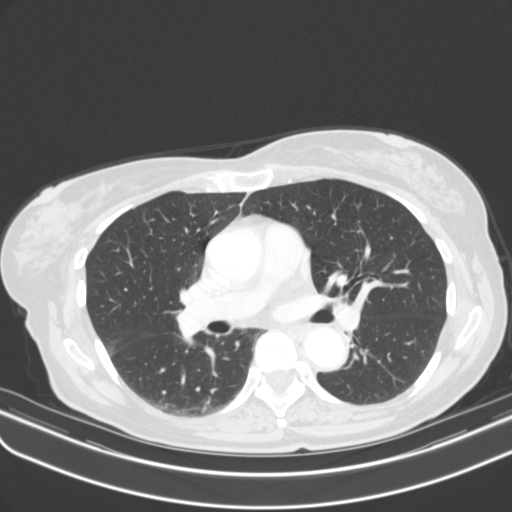
[im 83/150  mediastinal]
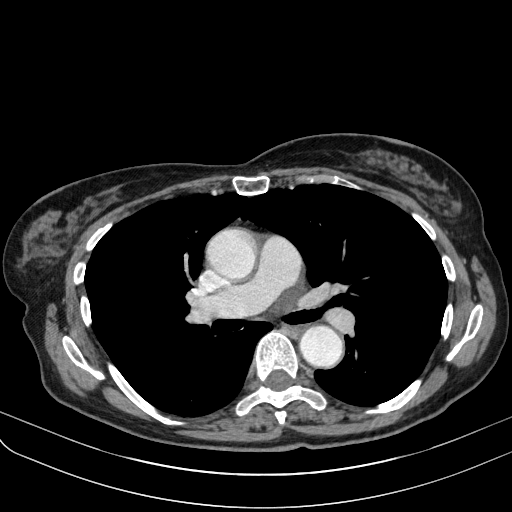
[im 83/150  lung]
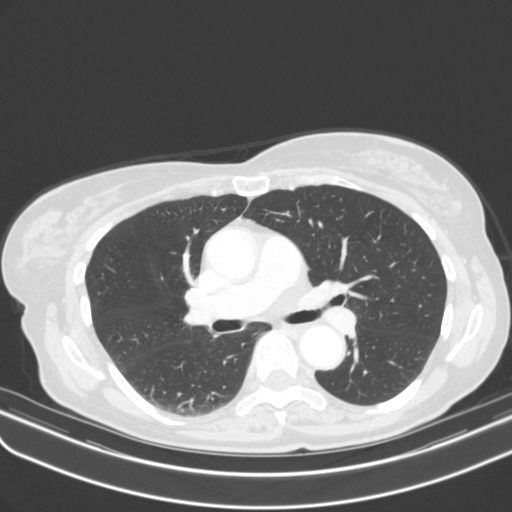
[im 90/150  lung]
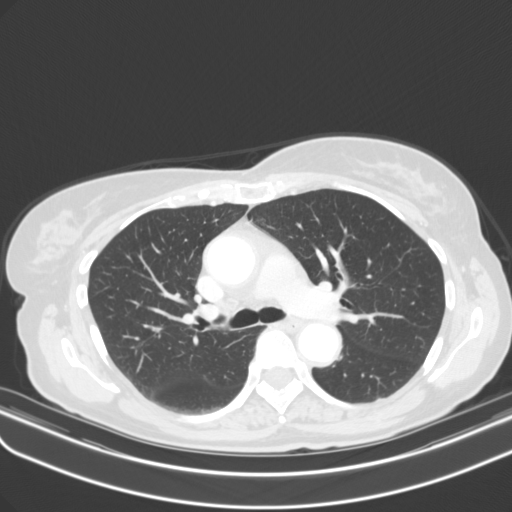
[im 100/150  lung]
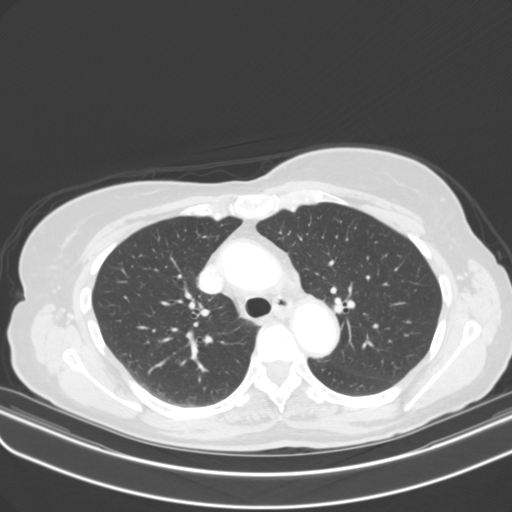
[im 111/150  lung]
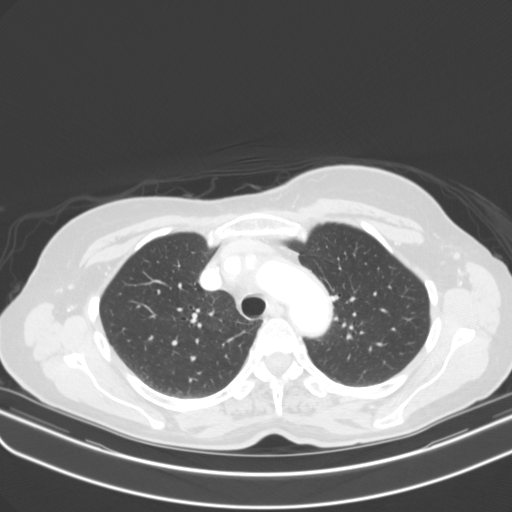
[im 120/150  mediastinal]
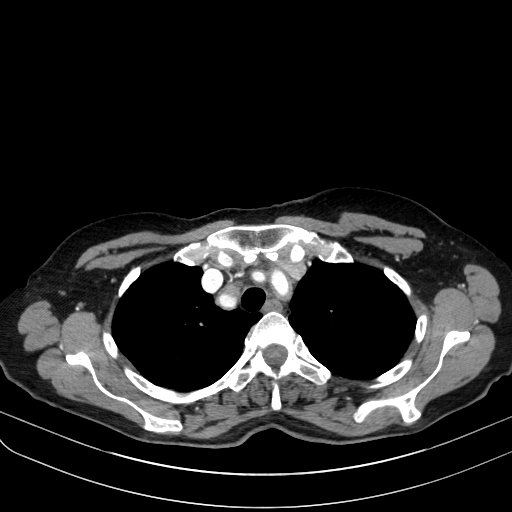
[im 120/150  lung]
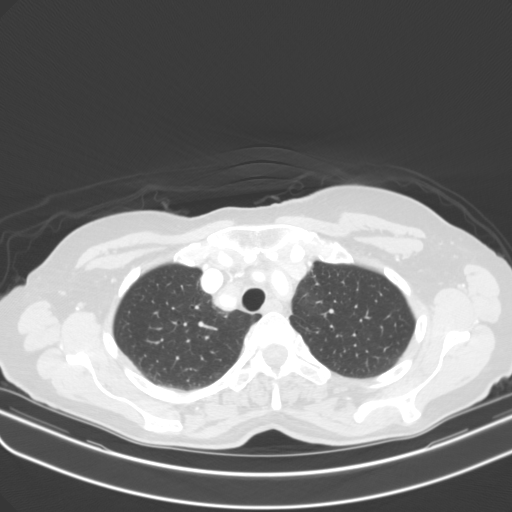
[im 127/150  lung]
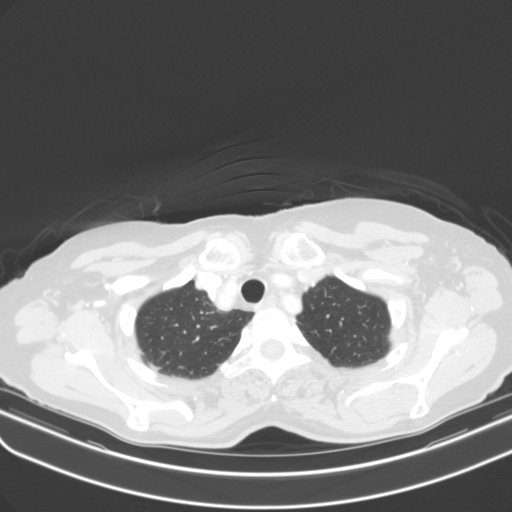
[im 138/150  lung]
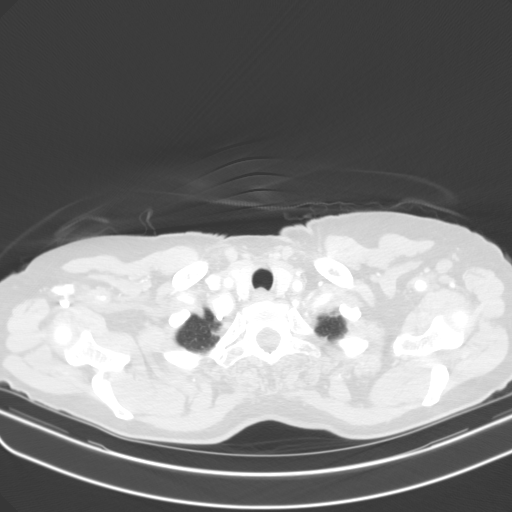

[15 of 34 positions shown; findings below may reference images not displayed]

FINDINGS: Cardiovascular: Normal branch pattern of the great vessels. No large
central pulmonary embolus. Ectatic ascending thoracic aorta. No
aneurysm. Heart size is normal trace pericardial effusion. There is
coronary arteriosclerosis most evident along the LAD and RCA.

Mediastinum/Nodes: No enlarged mediastinal, hilar, or axillary lymph
nodes. Thyroid gland, trachea, and esophagus demonstrate no
significant findings.

Lungs/Pleura: Bibasilar dependent atelectasis. No effusion or
pneumothorax. No dominant mass or acute pneumonic consolidation.

Upper Abdomen: In homogeneous enhancement of the liver with
scattered water attenuating hypodensities consistent with cysts
largest in the right hepatic lobe measuring cm. Fatty infiltration
along the falciform ligament. Adrenal mass. No splenomegaly.
Included pancreas and upper poles both kidneys are nonacute.
Moderate stool burden is seen of the visualized colon.

Musculoskeletal: No chest wall abnormality. No acute or significant
osseous findings.
IMPRESSION: 1. Bibasilar dependent atelectasis.  No active pulmonary disease.
2. Coronary arteriosclerosis.
3. Small water attenuating cysts of the liver.

## 2017-10-21 MED ORDER — IOPAMIDOL (ISOVUE-300) INJECTION 61%
75.0000 mL | Freq: Once | INTRAVENOUS | Status: AC | PRN
  Administered 2017-10-21: 75 mL via INTRAVENOUS

## 2017-10-28 ENCOUNTER — Other Ambulatory Visit: Payer: PPO

## 2017-11-22 DIAGNOSIS — R7982 Elevated C-reactive protein (CRP): Secondary | ICD-10-CM | POA: Diagnosis not present

## 2017-11-22 DIAGNOSIS — I2510 Atherosclerotic heart disease of native coronary artery without angina pectoris: Secondary | ICD-10-CM | POA: Diagnosis not present

## 2017-11-22 DIAGNOSIS — Z6823 Body mass index (BMI) 23.0-23.9, adult: Secondary | ICD-10-CM | POA: Diagnosis not present

## 2017-11-22 DIAGNOSIS — E038 Other specified hypothyroidism: Secondary | ICD-10-CM | POA: Diagnosis not present

## 2017-11-22 DIAGNOSIS — I10 Essential (primary) hypertension: Secondary | ICD-10-CM | POA: Diagnosis not present

## 2017-11-22 DIAGNOSIS — R918 Other nonspecific abnormal finding of lung field: Secondary | ICD-10-CM | POA: Diagnosis not present

## 2017-11-22 DIAGNOSIS — R51 Headache: Secondary | ICD-10-CM | POA: Diagnosis not present

## 2017-11-22 DIAGNOSIS — E7849 Other hyperlipidemia: Secondary | ICD-10-CM | POA: Diagnosis not present

## 2017-11-22 DIAGNOSIS — E559 Vitamin D deficiency, unspecified: Secondary | ICD-10-CM | POA: Diagnosis not present

## 2017-11-22 DIAGNOSIS — Z1389 Encounter for screening for other disorder: Secondary | ICD-10-CM | POA: Diagnosis not present

## 2017-12-22 ENCOUNTER — Encounter: Payer: Self-pay | Admitting: Neurology

## 2017-12-22 ENCOUNTER — Ambulatory Visit (INDEPENDENT_AMBULATORY_CARE_PROVIDER_SITE_OTHER): Payer: PPO | Admitting: Neurology

## 2017-12-22 ENCOUNTER — Encounter (INDEPENDENT_AMBULATORY_CARE_PROVIDER_SITE_OTHER): Payer: Self-pay

## 2017-12-22 ENCOUNTER — Other Ambulatory Visit: Payer: Self-pay

## 2017-12-22 ENCOUNTER — Telehealth: Payer: Self-pay | Admitting: Neurology

## 2017-12-22 VITALS — BP 125/69 | HR 77 | Ht 67.0 in | Wt 143.5 lb

## 2017-12-22 DIAGNOSIS — R55 Syncope and collapse: Secondary | ICD-10-CM | POA: Diagnosis not present

## 2017-12-22 DIAGNOSIS — G4489 Other headache syndrome: Secondary | ICD-10-CM | POA: Diagnosis not present

## 2017-12-22 DIAGNOSIS — R5381 Other malaise: Secondary | ICD-10-CM

## 2017-12-22 DIAGNOSIS — R5383 Other fatigue: Secondary | ICD-10-CM | POA: Diagnosis not present

## 2017-12-22 NOTE — Telephone Encounter (Signed)
Health team auth: [employer] via their website order sent to GI. They will reach out to the patient to schedule.

## 2017-12-22 NOTE — Progress Notes (Signed)
Reason for visit: Headaches  Referring physician: Dr. Jyl Heinz [name] is a 82 y.o. female  History of present illness:  [name] is a 82 year old right-handed white female with a history of onset of headaches that occurred 6-8 months prior to this evaluation.  The patient knows by November 2018 that she was having headaches on a regular basis.  She has also had concurrent generalized symptoms of fatigue and malaise, the patient is taking Motrin regularly and without it she feels that she cannot function at all.  She will at times run low-grade fevers.  She has had weight loss over the last several months of about 12 pounds.  She indicates that the headaches are always in the left occipital area, there is no associated neck discomfort.  The headaches are described as a boring constant pain that never goes away.  Three weeks ago, the patient spontaneously had resolution of her headaches, she is not having any headaches at this time.  The patient continues to have generalized malaise.  In January 2019 she was noted to have extremely elevated sedimentation rate and C-reactive protein studies, the C-reactive protein was over 95, sedimentation rate of 79.  Over time, repeat checks have shown some reduction in the inflammatory markers but not normalization of these studies.  The patient has undergone a CT scan of the brain that was relatively unremarkable.  CT of the chest was also done.  The patient had an episode about 3 weeks ago where she transiently lost vision and had dizziness just for a few seconds, she did not black out but she felt like she might.  The patient has a history of Raynaud's affecting the fingers.  She denies any weakness of the extremities or true numbness, she denies problems controlling the bowels or the bladder and she denies any balance changes.  She has not had any persistent vision changes.  She is sent to this office for an evaluation.  Past Medical History:  Diagnosis  Date  . Diverticulosis of colon 2005   Dr Carlean Purl  . Fracture of ankle, closed 2012   boot, no surgery  . Hyperlipidemia   . Hypertension   . Osteopenia   . Raynaud's phenomenon   . Scoliosis   . Skin cancer    X 2  . Thyroid disease    hypothyroidism    Past Surgical History:  Procedure Laterality Date  . CATARACT EXTRACTION     bilaterally, Dr Katy Fitch  . COLONOSCOPY  2005   Dr Carlean Purl  . DILATION AND CURETTAGE OF UTERUS    . HYSTEROSCOPY     Dr Radene Knee  . I&D EXTREMITY  2004   infection L hand  . MOHS SURGERY  2011   L temple  . MOHS SURGERY  2014   R cheek  . TONSILLECTOMY    . uterine polyp      Family History  Problem Relation Age of Onset  . Cancer Mother        stomach  . Alzheimer's disease Father   . Cancer Brother        leukemia  . Alcohol abuse Sister   . Alzheimer's disease Paternal Aunt        X 2  . Alzheimer's disease Paternal Grandmother   . Diabetes Neg Hx   . Heart disease Neg Hx   . Stroke Neg Hx   . Colon cancer Neg Hx     Social history:  reports that she  quit smoking about 35 years ago. She has never used smokeless tobacco. She reports that she drinks about 12.6 oz of alcohol per week. She reports that she does not use drugs.  Medications:  Prior to Admission medications   Medication Sig Start Date End Date Taking? Authorizing Provider  Biotin 300 MCG TABS Take by mouth daily.      [provider]  Calcium Carbonate-Vit D-Min (CALCIUM 1200 PO) Take by mouth. Viactiv     [provider]  Cholecalciferol (VITAMIN D-3) 5000 UNITS TABS Take by mouth daily.      [provider]  cycloSPORINE (RESTASIS) 0.05 % ophthalmic emulsion Place 1 drop into both eyes 2 (two) times daily.      [provider]  gabapentin (NEURONTIN) 100 MG capsule Take 100 mg by mouth at bedtime. 10/27/17   [provider]  KRILL OIL PO Take 2 capsules by mouth daily.     [provider]  lisinopril  (PRINIVIL,ZESTRIL) 10 MG tablet TAKE ONE TABLET BY MOUTH ONE TIME DAILY 09/15/17   Binnie Rail, MD  magnesium chloride (SLOW-MAG) 64 MG TBEC Take 1 tablet by mouth daily.      [provider]  meloxicam (MOBIC) 7.5 MG tablet TAKE 1 TABLET BY MOUTH ONE TO TWO TIMES DAILY AS NEEDED 02/10/17   Binnie Rail, MD  Multiple Vitamin (MULTIVITAMIN) tablet Take 1 tablet by mouth daily.      [provider]  NON FORMULARY Take 1 capsule by mouth daily. Target Brand Probiotic    [provider]  NON FORMULARY Place 1 spray under the tongue 2 (two) times daily.    [provider]  PREMARIN vaginal cream USE 2 GRAMS VAGINALLY EVERY NIGHT AT BEDTIME 1 -2 TIMES PER WEEK 04/21/15   [provider]  Psyllium (METAMUCIL PO) Take by mouth daily.      [provider]  SYNTHROID 88 MCG tablet TAKE 1 TABLET BY MOUTH, EXCEPT TAKE 1/2 TABLET DAILY MONDAYS TUES., THURS., AND SATURDAY. 10/05/17   Binnie Rail, MD      Allergies  Allergen Reactions  . Cephalexin     Rash Because of a history of documented adverse serious drug reaction;Medi Alert bracelet  is recommended  . Nitrofurantoin     Fever, mental status changes Because of a history of documented adverse serious drug reaction;Medi Alert bracelet  is recommended  . Penicillins     REACTION: RASH Because of a history of documented adverse serious drug reaction;Medi Alert bracelet  is recommended  . Lipitor [Atorvastatin]     D/Ced 08/2012 due to reading "Brain Drain" , Dr Rip Harbour    ROS:  Out of a complete 14 system review of symptoms, the patient complains only of the following symptoms, and all other reviewed systems are negative.  Low-grade fevers, night sweats Weight loss Fatigue Shortness of breath Feeling cold Headache, weakness, dizziness Anxiety, decreased energy, change in appetite Sleepiness  Height 5\' 7"  (1.702 m), weight 143 lb 8 oz (65.1 kg).  Physical Exam  General: The  patient is alert and cooperative at the time of the examination.  Eyes: Pupils are equal, round, and reactive to light. Discs are flat bilaterally.  Neck: The neck is supple, no carotid bruits are noted.  Respiratory: The respiratory examination is clear.  Cardiovascular: The cardiovascular examination reveals a regular rate and rhythm, no obvious murmurs or rubs are noted.  Neuromuscular: Range of movement the cervical spine was full, no crepitus  is noted in the temporomandibular joints.  Skin: Extremities are without significant edema.  Neurologic Exam  Mental status: The patient is alert and oriented x 3 at the time of the examination. The patient has apparent normal recent and remote memory, with an apparently normal attention span and concentration ability.  Cranial nerves: Facial symmetry is present. There is good sensation of the face to pinprick and soft touch bilaterally. The strength of the facial muscles and the muscles to head turning and shoulder shrug are normal bilaterally. Speech is well enunciated, no aphasia or dysarthria is noted. Extraocular movements are full. Visual fields are full. The tongue is midline, and the patient has symmetric elevation of the soft palate. No obvious hearing deficits are noted.  Motor: The motor testing reveals 5 over 5 strength of all 4 extremities. Good symmetric motor tone is noted throughout.  Sensory: Sensory testing is intact to pinprick, soft touch, vibration sensation, and position sense on all 4 extremities. No evidence of extinction is noted.  Coordination: Cerebellar testing reveals good finger-nose-finger and heel-to-shin bilaterally.  Gait and station: Gait is normal. Tandem gait is normal. Romberg is negative. No drift is seen.  Reflexes: Deep tendon reflexes are symmetric and normal bilaterally. Toes are downgoing bilaterally.    CT head 10/21/17:  IMPRESSION: Superficial atrophy with chronic appearing small vessel  ischemic disease. No acute intracranial abnormality.  * CT scan images were reviewed online. I agree with the written report.    Assessment/Plan:  1.  Left occipital headaches, resolved  2.  Generalized malaise, night sweats, low-grade fevers, fatigue  3.  Recent weight loss  4.  Episode of near syncope  The patient has had elevation in the sedimentation rate with generalized symptoms of malaise, night sweats, low-grade fevers, fatigue, and weight loss.  The patient may have a polymyalgia rheumatica syndrome.  The patient may be at risk for temporal arteritis, but her headaches are posterior in nature and have resolved spontaneously 3 weeks ago.  Temporal arteritis can cause posterior headaches but these headaches are unlikely to go away without treatment.  The patient will have further blood work today, if the sedimentation rate remains elevated, would consider a trial on prednisone.  The patient will be set up for MRI of the brain and MRA of the head given the episode of near syncope.  She will follow-up in 4 months.  A rheumatologic evaluation may be considered in the future.  Jill Alexanders MD 12/22/2017 8:22 AM  [facility] [address] [place], [place] [address]  Phone [phone] Fax [phone]

## 2017-12-23 ENCOUNTER — Telehealth: Payer: Self-pay | Admitting: Neurology

## 2017-12-23 LAB — C-REACTIVE PROTEIN: CRP: 40.2 mg/L — AB (ref 0.0–4.9)

## 2017-12-23 LAB — RHEUMATOID FACTOR: Rhuematoid fact SerPl-aCnc: <10 IU/mL (ref 0.0–13.9)

## 2017-12-23 LAB — ANA W/REFLEX: ANA: NEGATIVE

## 2017-12-23 LAB — HIV ANTIBODY (ROUTINE TESTING W REFLEX): HIV Screen 4th Generation wRfx: NONREACTIVE

## 2017-12-23 LAB — B. BURGDORFI ANTIBODIES: Lyme IgG/IgM Ab: <0.91 {ISR} (ref 0.00–0.90)

## 2017-12-23 LAB — SEDIMENTATION RATE: Sed Rate: 46 mm/hr — ABNORMAL HIGH (ref 0–40)

## 2017-12-23 LAB — ANGIOTENSIN CONVERTING ENZYME

## 2017-12-23 MED ORDER — PREDNISONE 10 MG PO TABS: 20.0000 mg | ORAL_TABLET | Freq: Every day | ORAL | 1 refills | Status: DC

## 2017-12-23 NOTE — Telephone Encounter (Signed)
I called the patient.  The blood work shows a slightly elevated sedimentation rate, but the C-reactive protein remains relatively high in the 40 range.  I will go ahead and start prednisone at 20 mg daily, we will try to reduce the dose quickly if she is feeling well.

## 2017-12-27 NOTE — Telephone Encounter (Signed)
Pt has called to inform that re: the predniSONE (DELTASONE) 10 MG tablet she is better, more energy but is also very dizzy.  Pt is asking if there is something she can do about the dizziness.

## 2017-12-27 NOTE — Telephone Encounter (Signed)
I called the patient.  The patient is feeling better with her overall energy level on the prednisone, the malaise is improved, she was able to do housework today.  She is complaining of some problems with a lightheaded sensation that was present before the prednisone was started but seems to have heightened some.  The patient has been set up for MRI of the brain and MRA of the head.  Not clear why the dizziness is present at this point.  She will stay on prednisone for now.

## 2018-01-04 ENCOUNTER — Ambulatory Visit
Admission: RE | Admit: 2018-01-04 | Discharge: 2018-01-04 | Disposition: A | Discharge status: Home / Self Care | Payer: PPO | Source: Ambulatory Visit | Attending: Neurology | Admitting: Neurology

## 2018-01-04 DIAGNOSIS — G4489 Other headache syndrome: Secondary | ICD-10-CM

## 2018-01-04 DIAGNOSIS — R55 Syncope and collapse: Secondary | ICD-10-CM | POA: Diagnosis not present

## 2018-01-04 DIAGNOSIS — R51 Headache: Secondary | ICD-10-CM | POA: Diagnosis not present

## 2018-01-06 ENCOUNTER — Telehealth: Payer: Self-pay | Admitting: Neurology

## 2018-01-06 NOTE — Telephone Encounter (Signed)
  I called the patient.  The MRI of the brain shows minimal white matter changes, no evidence of brainstem stroke.  The left vertebral artery is either occluded or highly stenotic, right vertebral artery has good flow but is irregular in the V4 segment.  I have recommended that the patient go on low-dose aspirin.  The patient did have left occipital headaches, it is possible that this correlated with a left vertebral artery occlusion.  I suppose this could potentially be a source of some of her dizziness which has persisted or even worsened on the prednisone.  The prednisone will be reduced to 10 mg daily.  Patient has an old prescription for meclizine, she may try this for the dizziness if it is effective I will call in a prescription.  MRA head 01/05/18:  IMPRESSION:   Abnormal MRA head (without) demonstrating: 1. The left vertebral artery has abnormal signal near the skull base.  It is poorly visualized up to the vertebrobasilar junction.  This may be due to high-grade stenosis or occlusion. 2. The right vertebral has irregular flow signal in the V4 segment which may represent fibromuscular dysplasia or atherosclerosis.   MRI brain 01/05/18:  IMPRESSION:   MRI brain (without) demonstrating: - mild generalized atrophy. - few nonspecific foci of subcortical gliosis. - no acute findings.

## 2018-01-10 ENCOUNTER — Telehealth: Payer: Self-pay | Admitting: Neurology

## 2018-01-10 MED ORDER — MECLIZINE HCL 12.5 MG PO TABS: 12.5000 mg | ORAL_TABLET | Freq: Three times a day (TID) | ORAL | 1 refills | Status: DC | PRN

## 2018-01-10 NOTE — Telephone Encounter (Signed)
I called pt back. Advised I released results to mychart for her. She verbalized understanding and was able to view them.   She cut prednisone to 10mg  daily but today her dizziness has worsened. She has not yet tried meclizine 12.5mg  tablet that she has at home (directions: take 1 tablet TID prn for dizziness). She only has two tablets left and wondering if Dr. Jannifer Franklin would be agreeable to call in new rx for her. Advised I will send request to MD.

## 2018-01-10 NOTE — Addendum Note (Signed)
Addended by: Kathrynn Ducking on: 01/10/2018 04:56 PM   Modules accepted: Orders

## 2018-01-10 NOTE — Telephone Encounter (Signed)
I will call in a prescription for the meclizine.

## 2018-01-10 NOTE — Telephone Encounter (Signed)
Patient calling to find out why MRI results are not on my chart, Her children would like to look at the results.

## 2018-01-17 ENCOUNTER — Telehealth: Payer: Self-pay | Admitting: Neurology

## 2018-01-17 NOTE — Telephone Encounter (Signed)
Pt's daughter Terrial Rhodes on [place]) called she has several questions reg MRI.  She also wants to know if the pt should take more than a baby Asprin.   She said the pt thinks the dizziness is coming from the prednisone. She said the dizziness is different than what she had a year ago, it happens when she is sitting still.  Please call to advise her at [phone]

## 2018-01-17 NOTE — Telephone Encounter (Signed)
I called and talk with the daughter.  The patient continues to feel poorly even on the prednisone, obviously she does not have polymyalgia rheumatica, she is tapering off of the prednisone, currently on 5 mg daily, she will stay on this for 1 more week and then stop.  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  I do not think that this explains all of her symptoms such as generalized malaise, low-grade fevers, night sweats, and weight loss.  The patient also has elevated sedimentation rate and C-reactive protein, the MRI findings do not explain this either.  I would wonder whether or not the patient should see a rheumatologist for an opinion.  I will contact Dr. Forde Dandy regarding this.   MRA head 01/05/18:  IMPRESSION:   Abnormal MRA head (without) demonstrating: 1. The left vertebral artery has abnormal signal near the skull base. It is poorly visualized up to the vertebrobasilar junction. This may be due to high-grade stenosis or occlusion. 2. The right vertebral has irregular flow signal in the V4 segment which may represent fibromuscular dysplasia or atherosclerosis.   MRI brain 01/05/18:  IMPRESSION:   MRI brain (without) demonstrating: - mild generalized atrophy. - few nonspecific foci of subcortical gliosis. - no acute findings.

## 2018-01-25 ENCOUNTER — Telehealth: Payer: Self-pay | Admitting: Neurology

## 2018-01-25 MED ORDER — DULOXETINE HCL 30 MG PO CPEP: 30.0000 mg | ORAL_CAPSULE | Freq: Every day | ORAL | 3 refills | Status: DC

## 2018-01-25 NOTE — Telephone Encounter (Signed)
I called the patient.  The patient is having ongoing problems with malaise, night sweats, dizziness and fatigue.  The patient felt quite well yesterday, now feels poorly today.  Having almost nightly episodes of sweats.  The patient may be referred to rheumatology, I had contacted Dr. Forde Dandy previously.  I will try low-dose Cymbalta to see if this improves her symptoms.  The patient relates onset of symptoms around the time that she was bitten by a bat, she had rabies shots following this.

## 2018-01-25 NOTE — Addendum Note (Signed)
Addended by: Kathrynn Ducking on: 01/25/2018 03:16 PM   Modules accepted: Orders

## 2018-01-25 NOTE — Telephone Encounter (Signed)
Patient states she stopped taking Prednisone last Thursday but still has some medication left. She says she just feels wretched. She is experiencing dizziness only when she moves around,  feels shaky all over and her heart is racing intermittently. Please call and discuss.

## 2018-02-06 ENCOUNTER — Encounter: Payer: PPO | Admitting: Internal Medicine

## 2018-03-13 DIAGNOSIS — N390 Urinary tract infection, site not specified: Secondary | ICD-10-CM | POA: Diagnosis not present

## 2018-03-13 DIAGNOSIS — N8182 Incompetence or weakening of pubocervical tissue: Secondary | ICD-10-CM | POA: Diagnosis not present

## 2018-03-13 DIAGNOSIS — R102 Pelvic and perineal pain: Secondary | ICD-10-CM | POA: Diagnosis not present

## 2018-03-22 DIAGNOSIS — R51 Headache: Secondary | ICD-10-CM | POA: Diagnosis not present

## 2018-03-22 DIAGNOSIS — I7300 Raynaud's syndrome without gangrene: Secondary | ICD-10-CM | POA: Diagnosis not present

## 2018-03-22 DIAGNOSIS — R61 Generalized hyperhidrosis: Secondary | ICD-10-CM | POA: Diagnosis not present

## 2018-03-22 DIAGNOSIS — M8580 Other specified disorders of bone density and structure, unspecified site: Secondary | ICD-10-CM | POA: Diagnosis not present

## 2018-03-22 DIAGNOSIS — E038 Other specified hypothyroidism: Secondary | ICD-10-CM | POA: Diagnosis not present

## 2018-03-22 DIAGNOSIS — Z6823 Body mass index (BMI) 23.0-23.9, adult: Secondary | ICD-10-CM | POA: Diagnosis not present

## 2018-03-22 DIAGNOSIS — R7982 Elevated C-reactive protein (CRP): Secondary | ICD-10-CM | POA: Diagnosis not present

## 2018-03-22 DIAGNOSIS — E559 Vitamin D deficiency, unspecified: Secondary | ICD-10-CM | POA: Diagnosis not present

## 2018-03-22 DIAGNOSIS — E7849 Other hyperlipidemia: Secondary | ICD-10-CM | POA: Diagnosis not present

## 2018-03-22 DIAGNOSIS — R9089 Other abnormal findings on diagnostic imaging of central nervous system: Secondary | ICD-10-CM | POA: Diagnosis not present

## 2018-03-31 DIAGNOSIS — I7300 Raynaud's syndrome without gangrene: Secondary | ICD-10-CM | POA: Diagnosis not present

## 2018-03-31 DIAGNOSIS — R5381 Other malaise: Secondary | ICD-10-CM | POA: Diagnosis not present

## 2018-03-31 DIAGNOSIS — Z6823 Body mass index (BMI) 23.0-23.9, adult: Secondary | ICD-10-CM | POA: Diagnosis not present

## 2018-03-31 DIAGNOSIS — R42 Dizziness and giddiness: Secondary | ICD-10-CM | POA: Diagnosis not present

## 2018-03-31 DIAGNOSIS — R5383 Other fatigue: Secondary | ICD-10-CM | POA: Diagnosis not present

## 2018-03-31 DIAGNOSIS — R350 Frequency of micturition: Secondary | ICD-10-CM | POA: Diagnosis not present

## 2018-03-31 DIAGNOSIS — R509 Fever, unspecified: Secondary | ICD-10-CM | POA: Diagnosis not present

## 2018-04-20 DIAGNOSIS — R5381 Other malaise: Secondary | ICD-10-CM | POA: Diagnosis not present

## 2018-04-20 DIAGNOSIS — M791 Myalgia, unspecified site: Secondary | ICD-10-CM | POA: Diagnosis not present

## 2018-04-20 DIAGNOSIS — Z6822 Body mass index (BMI) 22.0-22.9, adult: Secondary | ICD-10-CM | POA: Diagnosis not present

## 2018-04-20 DIAGNOSIS — R509 Fever, unspecified: Secondary | ICD-10-CM | POA: Diagnosis not present

## 2018-04-20 DIAGNOSIS — R42 Dizziness and giddiness: Secondary | ICD-10-CM | POA: Diagnosis not present

## 2018-04-20 DIAGNOSIS — I7300 Raynaud's syndrome without gangrene: Secondary | ICD-10-CM | POA: Diagnosis not present

## 2018-04-20 DIAGNOSIS — R5383 Other fatigue: Secondary | ICD-10-CM | POA: Diagnosis not present

## 2018-04-24 ENCOUNTER — Encounter: Payer: Self-pay | Admitting: Neurology

## 2018-04-24 ENCOUNTER — Ambulatory Visit (INDEPENDENT_AMBULATORY_CARE_PROVIDER_SITE_OTHER): Payer: PPO | Admitting: Neurology

## 2018-04-24 ENCOUNTER — Telehealth: Payer: Self-pay | Admitting: Neurology

## 2018-04-24 VITALS — BP 110/60 | HR 56 | Ht 67.0 in | Wt 144.0 lb

## 2018-04-24 DIAGNOSIS — R5381 Other malaise: Secondary | ICD-10-CM

## 2018-04-24 DIAGNOSIS — R5383 Other fatigue: Secondary | ICD-10-CM

## 2018-04-24 NOTE — Progress Notes (Signed)
Reason for visit: Myalgias, chronic fatigue  Tara Bridges is an 82 y.o. female  History of present illness:  Ms. Barrientez is an 82 year old right-handed white female with a history of chronic fatigue, night sweats, myalgias and weight loss.  The patient had extremely elevated sedimentation rate and C-reactive protein, the etiology of this is not clear.  The patient has been seen by Dr. Trudie Reed, extensive blood work has been done, the source of the problem has not been delineated.  The patient did not respond to prednisone, it is felt that she may have polymyalgia rheumatica initially.  The patient has gradually had improvement in the sedimentation rate and C-reactive protein levels, she has begun to feel somewhat better, she is no longer having night sweats, she is maintaining her weight.  The patient has had some muscle discomfort more recently, muscle enzyme levels done by Dr. Trudie Reed was normal.  The patient works out on a regular basis, she has not noted any weakness of her extremities.  She takes Motrin 400 mg twice daily, this seems to help immensely with her discomfort.  She returns for an evaluation.  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  She has good supply through the right vertebral artery to the basilar.  The patient returns for an evaluation.  She continues to have some episodes of dizziness without syncope.  Past Medical History:  Diagnosis Date  . Diverticulosis of colon 2005   Dr Carlean Purl  . Fracture of ankle, closed 2012   boot, no surgery  . Hyperlipidemia   . Hypertension   . Osteopenia   . Raynaud's phenomenon   . Scoliosis   . Skin cancer    X 2  . Thyroid disease    hypothyroidism    Past Surgical History:  Procedure Laterality Date  . CATARACT EXTRACTION     bilaterally, Dr Katy Fitch  . COLONOSCOPY  2005   Dr Carlean Purl  . DILATION AND CURETTAGE OF UTERUS    . HYSTEROSCOPY       Dr Radene Knee  . I&D EXTREMITY  2004   infection L hand  . MOHS SURGERY  2011   L temple  . MOHS SURGERY  2014   R cheek  . TONSILLECTOMY    . uterine polyp      Family History  Problem Relation Age of Onset  . Cancer Mother        stomach  . Alzheimer's disease Father   . Cancer Brother        leukemia  . Alcohol abuse Sister   . Alzheimer's disease Paternal Aunt        X 2  . Alzheimer's disease Paternal Grandmother   . Diabetes Neg Hx   . Heart disease Neg Hx   . Stroke Neg Hx   . Colon cancer Neg Hx     Social history:  reports that she quit smoking about 35 years ago. She has never used smokeless tobacco. She reports that she drinks about 21.0 standard drinks of alcohol per week. She reports that she does not use drugs.    Allergies  Allergen Reactions  . Cephalexin     Rash Because of a history of documented adverse serious drug reaction;Medi Alert bracelet  is recommended  . Nitrofurantoin     Fever, mental status changes Because of a history of documented adverse serious drug reaction;Medi Alert bracelet  is recommended  . Penicillins     REACTION: RASH Because of a history of documented adverse serious drug reaction;Medi Alert bracelet  is recommended  . Lipitor [Atorvastatin]     D/Ced 08/2012 due to reading "Brain Drain" , Dr Rip Harbour    Medications:  Prior to Admission medications   Medication Sig Start Date End Date Taking? Authorizing Provider  Biotin 300 MCG TABS Take by mouth daily.     Yes [provider]  Calcium Carbonate-Vit D-Min (CALCIUM 1200 PO) Take by mouth. Viactiv    Yes [provider]  Cholecalciferol (VITAMIN D-3) 5000 UNITS TABS Take by mouth daily.     Yes [provider]  cycloSPORINE (RESTASIS) 0.05 % ophthalmic emulsion Place 1 drop into both eyes 2 (two) times daily.     Yes [provider]  DULoxetine (CYMBALTA) 30 MG capsule Take 1 capsule (30 mg total) by mouth daily. 01/25/18  Yes Kathrynn Ducking, MD  KRILL OIL PO Take 2 capsules by mouth daily.    Yes [provider]  lisinopril (PRINIVIL,ZESTRIL) 10 MG tablet TAKE ONE TABLET BY MOUTH ONE TIME DAILY 09/15/17  Yes Burns, Claudina Lick, MD  magnesium chloride (SLOW-MAG) 64 MG TBEC Take 1 tablet by mouth daily.     Yes [provider]  meclizine (ANTIVERT) 12.5 MG tablet Take 1 tablet (12.5 mg total) by mouth 3 (three) times daily as needed for dizziness. 01/10/18  Yes Kathrynn Ducking, MD  meloxicam (MOBIC) 7.5 MG tablet TAKE 1 TABLET BY MOUTH ONE TO TWO TIMES DAILY AS NEEDED 02/10/17  Yes Burns, Claudina Lick, MD  Multiple Vitamin (MULTIVITAMIN) tablet Take 1 tablet by mouth daily.     Yes [provider]  NON FORMULARY Take 1 capsule by mouth daily. Target Brand Probiotic   Yes [provider]  NON FORMULARY Place 1 spray under the tongue 2 (two) times daily.   Yes [provider]  PREMARIN vaginal cream USE 2 GRAMS VAGINALLY EVERY NIGHT AT BEDTIME 1 -2 TIMES PER WEEK 04/21/15  Yes [provider]  Psyllium (METAMUCIL PO) Take by mouth daily.     Yes [provider]  SYNTHROID 88 MCG tablet TAKE 1 TABLET BY MOUTH, EXCEPT TAKE 1/2 TABLET DAILY MONDAYS TUES., THURS., AND SATURDAY. Patient taking differently: Takes 1 tablet every day except on Sunday she takes 1/2 tab 10/05/17  Yes Burns, Claudina Lick, MD  gabapentin (NEURONTIN) 100 MG capsule Take 100 mg by mouth at bedtime. 10/27/17   [provider]    ROS:  Out of a complete 14 system review of symptoms, the patient complains only of the following symptoms, and all other reviewed systems are negative.  Fatigue, fevers Aching muscle Dizziness  Blood pressure 110/60, pulse (!) 56, height 5\' 7"  (1.702 m), weight 144 lb (65.3 kg), SpO2 98 %.  Physical Exam  General: The patient is alert and cooperative at the time of the examination.  Skin: No significant peripheral edema is noted.   Neurologic Exam  Mental status: The  patient is alert and oriented x 3 at the time of the examination. The patient has apparent normal recent and remote memory, with an apparently normal attention span and concentration ability.   Cranial nerves: Facial symmetry is present. Speech is normal, no aphasia or dysarthria is noted. Extraocular movements are full. Visual fields are full.  Motor: The patient has good strength in all 4 extremities.  Sensory examination: Soft touch sensation is symmetric on  the face, arms, and legs.  Coordination: The patient has good finger-nose-finger and heel-to-shin bilaterally.  Gait and station: The patient has a normal gait. Tandem gait is normal. Romberg is negative. No drift is seen.  Reflexes: Deep tendon reflexes are symmetric.   MRA head 01/05/18:  IMPRESSION:   Abnormal MRA head (without) demonstrating: 1. The left vertebral artery has abnormal signal near the skull base. It is poorly visualized up to the vertebrobasilar junction. This may be due to high-grade stenosis or occlusion. 2. The right vertebral has irregular flow signal in the V4 segment which may represent fibromuscular dysplasia or atherosclerosis.   MRI brain 01/05/18:  IMPRESSION:   MRI brain (without) demonstrating: - mild generalized atrophy. - few nonspecific foci of subcortical gliosis. - no acute findings.  * MRI scan images were reviewed online. I agree with the written report.    Assessment/Plan:  1.  Malaise, myalgias, night sweats, weight loss  2.  Left vertebral artery occlusion  3.  Dizziness, headache  The patient overall is doing some better, she feels much better if she takes Motrin 400 mg twice daily.  She has a chronic inflammatory process associated with low-grade fevers, malaise, night sweats, and weight loss.  Etiology of this is not clear but the process seems to be improving gradually.  The patient is followed through rheumatology.  I will see the patient back on an as-needed  basis.  The patient is to contact me should believe that she is developing persistent numbness or weakness.    Greater than 50% of the visit was spent in counseling and coordination of care.  Face-to-face time with the patient was 20 minutes.   Jill Alexanders MD 04/24/2018 11:17 AM  [facility] [address] [place], [place] [address]  Phone [phone] Fax [phone]

## 2018-04-24 NOTE — Telephone Encounter (Signed)
The patient has been seen through rheumatology, Dr. Trudie Reed.  The elevated C-reactive protein and sedimentation rate have gradually improved along with the patient's symptomatology.  Source of the problems is unknown.  She still has some mild discomfort throughout the day.  Energy level is slightly better.  She is no longer having night sweats.

## 2018-04-27 DIAGNOSIS — Z961 Presence of intraocular lens: Secondary | ICD-10-CM | POA: Diagnosis not present

## 2018-04-27 DIAGNOSIS — H16223 Keratoconjunctivitis sicca, not specified as Sjogren's, bilateral: Secondary | ICD-10-CM | POA: Diagnosis not present

## 2018-04-27 DIAGNOSIS — H353121 Nonexudative age-related macular degeneration, left eye, early dry stage: Secondary | ICD-10-CM | POA: Diagnosis not present

## 2018-05-15 DIAGNOSIS — Z01419 Encounter for gynecological examination (general) (routine) without abnormal findings: Secondary | ICD-10-CM | POA: Diagnosis not present

## 2018-05-15 DIAGNOSIS — M8588 Other specified disorders of bone density and structure, other site: Secondary | ICD-10-CM | POA: Diagnosis not present

## 2018-05-15 DIAGNOSIS — N8189 Other female genital prolapse: Secondary | ICD-10-CM | POA: Diagnosis not present

## 2018-05-15 DIAGNOSIS — N3281 Overactive bladder: Secondary | ICD-10-CM | POA: Diagnosis not present

## 2018-05-15 DIAGNOSIS — Z6823 Body mass index (BMI) 23.0-23.9, adult: Secondary | ICD-10-CM | POA: Diagnosis not present

## 2018-05-15 DIAGNOSIS — N958 Other specified menopausal and perimenopausal disorders: Secondary | ICD-10-CM | POA: Diagnosis not present

## 2018-05-19 ENCOUNTER — Other Ambulatory Visit: Payer: Self-pay | Admitting: Obstetrics and Gynecology

## 2018-05-19 DIAGNOSIS — E041 Nontoxic single thyroid nodule: Secondary | ICD-10-CM

## 2018-05-22 ENCOUNTER — Ambulatory Visit
Admission: RE | Admit: 2018-05-22 | Discharge: 2018-05-22 | Disposition: A | Discharge status: Home / Self Care | Payer: PPO | Source: Ambulatory Visit | Attending: Obstetrics and Gynecology | Admitting: Obstetrics and Gynecology

## 2018-05-22 DIAGNOSIS — E042 Nontoxic multinodular goiter: Secondary | ICD-10-CM | POA: Diagnosis not present

## 2018-05-22 DIAGNOSIS — E041 Nontoxic single thyroid nodule: Secondary | ICD-10-CM

## 2018-05-22 IMAGING — US US THYROID
1 series · 13 of 25 positions shown · non-contrast
Comparison: 01/14/2011, 04/29/2015

CLINICAL DATA: Bilateral nodules on physical exam

EXAM:
THYROID ULTRASOUND
TECHNIQUE: Ultrasound examination of the thyroid gland and adjacent soft
tissues was performed.

[Series 1: us thyroid · 0.08mm/px · 13 of 42 slices shown]
[im 1/42]
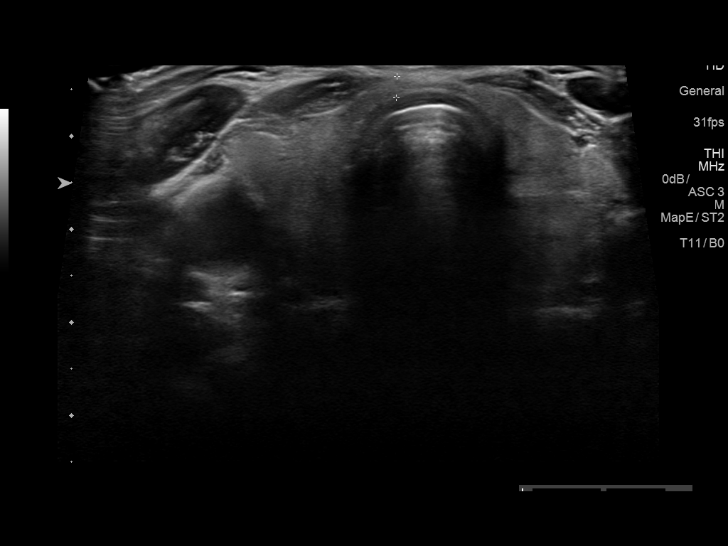
[im 4/42]
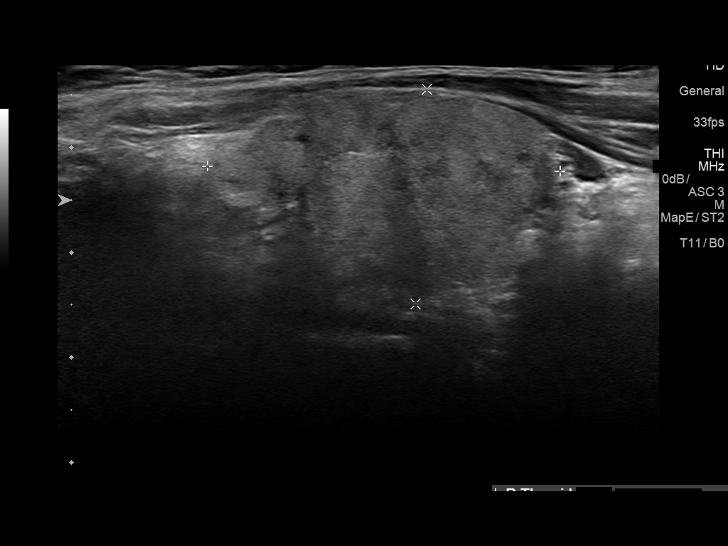
[im 7/42]
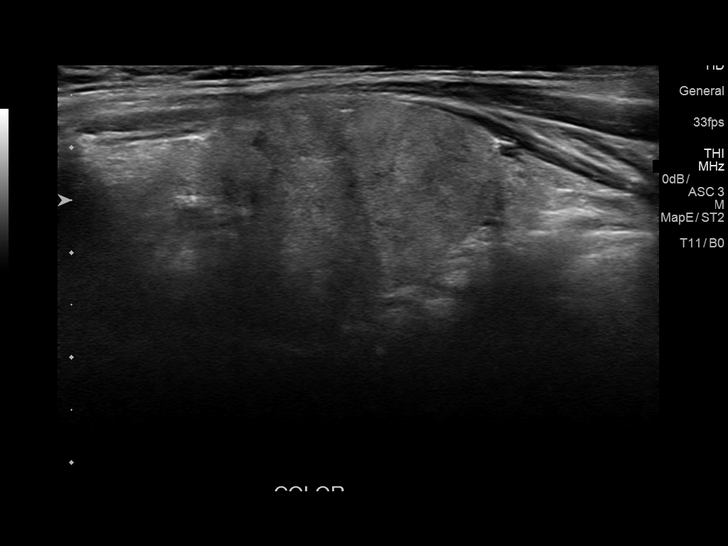
[im 11/42]
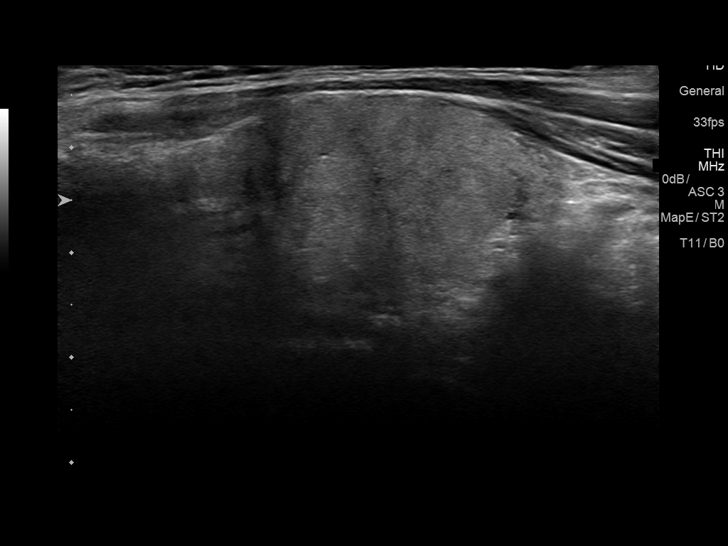
[im 14/42]
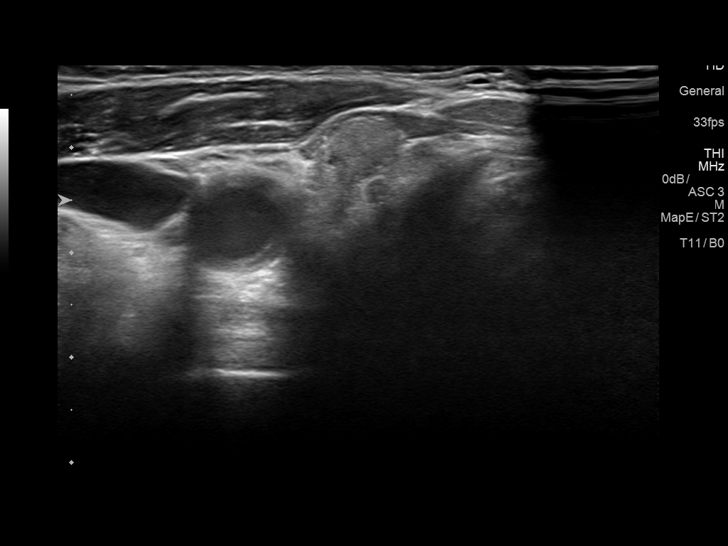
[im 18/42]
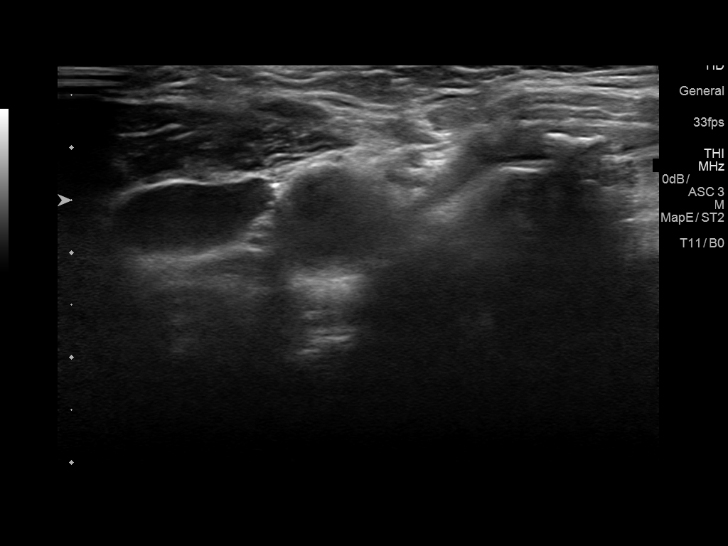
[im 21/42]
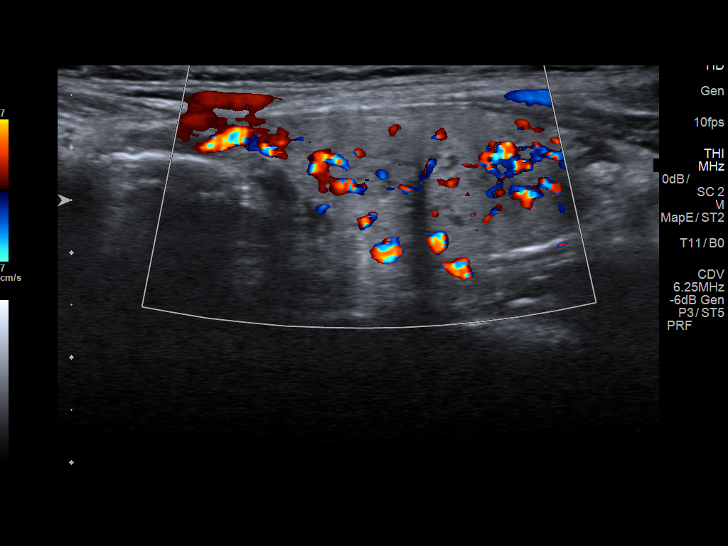
[im 24/42]
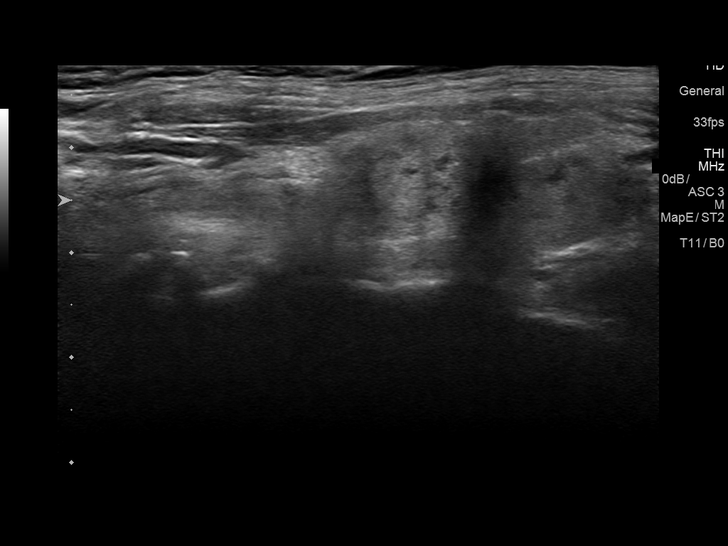
[im 28/42]
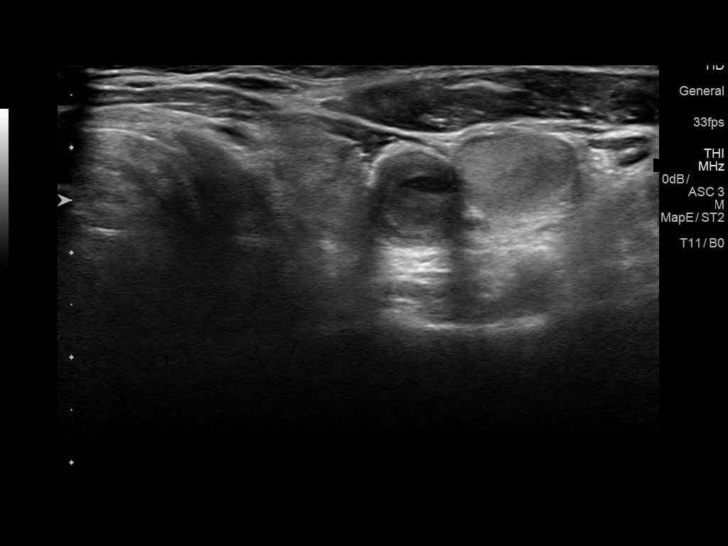
[im 31/42]
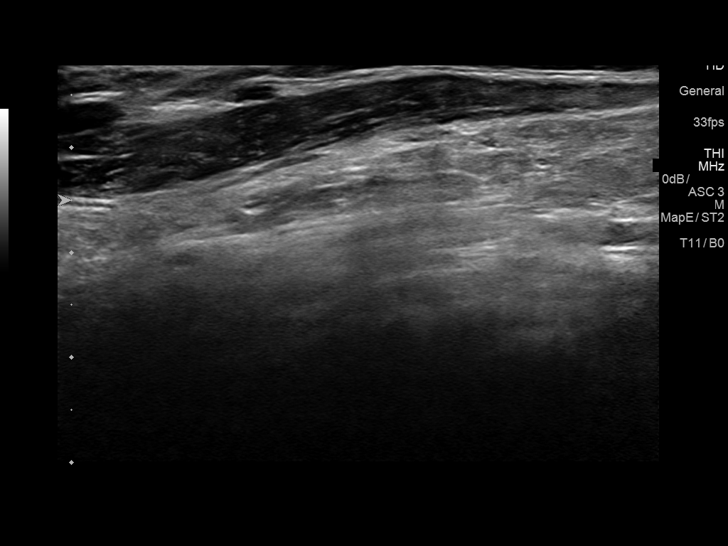
[im 35/42]
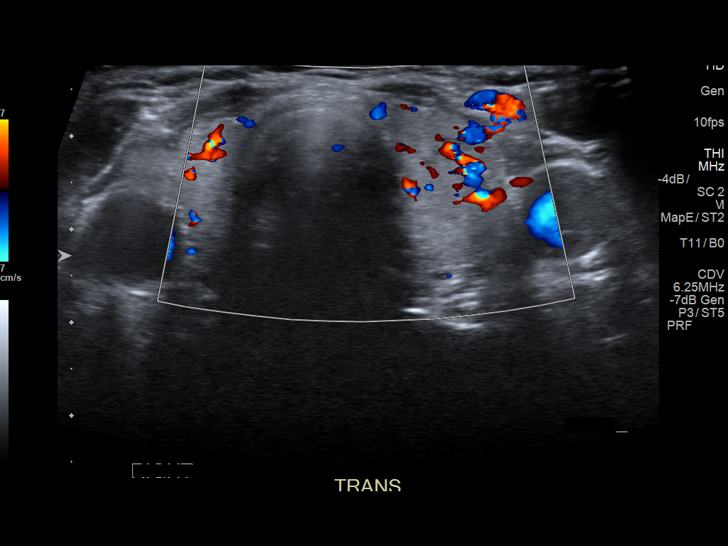
[im 38/42]
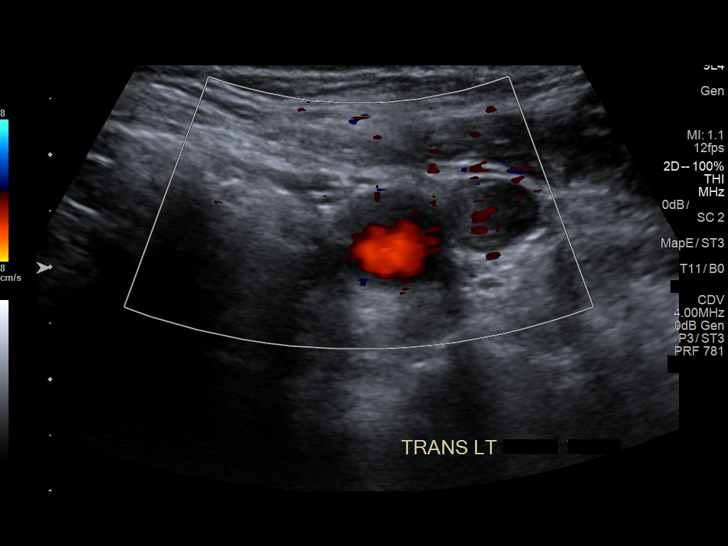
[im 42/42]
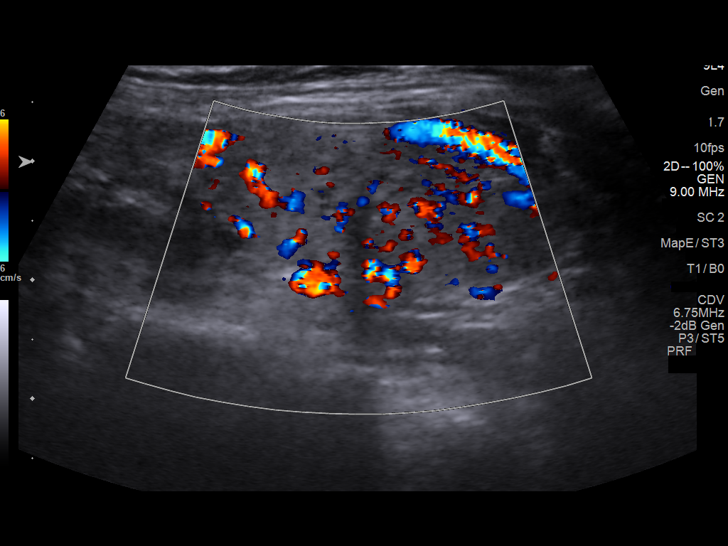

[13 of 25 positions shown; findings below may reference images not displayed]

FINDINGS: Parenchymal Echotexture: Mildly heterogenous

Isthmus: 0.2 cm thickness, previously

Right lobe: 3.4 x 2.1 x 1.4 cm, previously 4.2 x 1.7 x

Left lobe: 3.4 x 1.4 x 1.4 cm, previously 3.8 x 1.6 x

_________________________________________________________

Estimated total number of nodules >/= 1 cm: 0

Number of spongiform nodules >/=  2 cm not described below (TR1): 0

Number of mixed cystic and solid nodules >/= 1.5 cm not described
below (TR2): 0

_________________________________________________________

No discrete nodules are seen within the thyroid gland.

Note made of slow flow in the left IJ vein with internal echoes
which may represent physiologic rouleaux formation versus IJ
thrombus. A dedicated vascular evaluation was not performed.
IMPRESSION: 1. Normal-sized thyroid, mildly heterogenous without nodule.
2. Echogenic left IJ vein lumen, nonspecific. Consider dedicated
ultrasound vascular evaluation if there is clinical concern of
venous thrombosis.

The above is in keeping with the ACR TI-RADS recommendations - [HOSPITAL] 6193;[DATE].

## 2018-05-24 ENCOUNTER — Other Ambulatory Visit: Payer: Self-pay | Admitting: Internal Medicine

## 2018-05-24 DIAGNOSIS — E038 Other specified hypothyroidism: Secondary | ICD-10-CM

## 2018-05-31 ENCOUNTER — Other Ambulatory Visit (HOSPITAL_COMMUNITY): Payer: Self-pay | Admitting: Obstetrics and Gynecology

## 2018-05-31 DIAGNOSIS — R52 Pain, unspecified: Secondary | ICD-10-CM

## 2018-06-01 ENCOUNTER — Ambulatory Visit (HOSPITAL_COMMUNITY): Payer: PPO

## 2018-06-05 ENCOUNTER — Ambulatory Visit (HOSPITAL_COMMUNITY)
Admission: RE | Admit: 2018-06-05 | Discharge: 2018-06-05 | Disposition: A | Discharge status: Home / Self Care | Payer: PPO | Source: Ambulatory Visit | Attending: Obstetrics and Gynecology | Admitting: Obstetrics and Gynecology

## 2018-06-05 DIAGNOSIS — R52 Pain, unspecified: Secondary | ICD-10-CM | POA: Diagnosis not present

## 2018-06-05 NOTE — Progress Notes (Signed)
Left upper extremity venous duplex has been completed. Negative for DVT. Results were faxed to Dr. Radene Knee.  06/05/18 11:28 AM Tara Bridges RVT

## 2018-06-21 DIAGNOSIS — R5383 Other fatigue: Secondary | ICD-10-CM | POA: Diagnosis not present

## 2018-06-21 DIAGNOSIS — R5381 Other malaise: Secondary | ICD-10-CM | POA: Diagnosis not present

## 2018-06-21 DIAGNOSIS — M791 Myalgia, unspecified site: Secondary | ICD-10-CM | POA: Diagnosis not present

## 2018-06-21 DIAGNOSIS — Z6823 Body mass index (BMI) 23.0-23.9, adult: Secondary | ICD-10-CM | POA: Diagnosis not present

## 2018-06-21 DIAGNOSIS — I7300 Raynaud's syndrome without gangrene: Secondary | ICD-10-CM | POA: Diagnosis not present

## 2018-07-17 DIAGNOSIS — Z23 Encounter for immunization: Secondary | ICD-10-CM | POA: Diagnosis not present

## 2018-08-07 DIAGNOSIS — L812 Freckles: Secondary | ICD-10-CM | POA: Diagnosis not present

## 2018-08-07 DIAGNOSIS — Z85828 Personal history of other malignant neoplasm of skin: Secondary | ICD-10-CM | POA: Diagnosis not present

## 2018-08-07 DIAGNOSIS — L821 Other seborrheic keratosis: Secondary | ICD-10-CM | POA: Diagnosis not present

## 2018-08-07 DIAGNOSIS — D1801 Hemangioma of skin and subcutaneous tissue: Secondary | ICD-10-CM | POA: Diagnosis not present

## 2018-09-11 DIAGNOSIS — Z1231 Encounter for screening mammogram for malignant neoplasm of breast: Secondary | ICD-10-CM | POA: Diagnosis not present

## 2018-09-20 DIAGNOSIS — R7982 Elevated C-reactive protein (CRP): Secondary | ICD-10-CM | POA: Diagnosis not present

## 2018-09-20 DIAGNOSIS — R51 Headache: Secondary | ICD-10-CM | POA: Diagnosis not present

## 2018-09-20 DIAGNOSIS — R9089 Other abnormal findings on diagnostic imaging of central nervous system: Secondary | ICD-10-CM | POA: Diagnosis not present

## 2018-09-20 DIAGNOSIS — I2510 Atherosclerotic heart disease of native coronary artery without angina pectoris: Secondary | ICD-10-CM | POA: Diagnosis not present

## 2018-09-20 DIAGNOSIS — E7849 Other hyperlipidemia: Secondary | ICD-10-CM | POA: Diagnosis not present

## 2018-09-20 DIAGNOSIS — E038 Other specified hypothyroidism: Secondary | ICD-10-CM | POA: Diagnosis not present

## 2018-09-20 DIAGNOSIS — I7300 Raynaud's syndrome without gangrene: Secondary | ICD-10-CM | POA: Diagnosis not present

## 2018-09-20 DIAGNOSIS — I10 Essential (primary) hypertension: Secondary | ICD-10-CM | POA: Diagnosis not present

## 2018-09-20 DIAGNOSIS — E559 Vitamin D deficiency, unspecified: Secondary | ICD-10-CM | POA: Diagnosis not present

## 2018-09-20 DIAGNOSIS — R5381 Other malaise: Secondary | ICD-10-CM | POA: Diagnosis not present

## 2018-09-22 ENCOUNTER — Other Ambulatory Visit: Payer: Self-pay | Admitting: Internal Medicine

## 2018-09-22 DIAGNOSIS — I10 Essential (primary) hypertension: Secondary | ICD-10-CM

## 2019-03-22 DIAGNOSIS — E038 Other specified hypothyroidism: Secondary | ICD-10-CM | POA: Diagnosis not present

## 2019-03-22 DIAGNOSIS — E039 Hypothyroidism, unspecified: Secondary | ICD-10-CM | POA: Diagnosis not present

## 2019-03-22 DIAGNOSIS — I10 Essential (primary) hypertension: Secondary | ICD-10-CM | POA: Diagnosis not present

## 2019-03-22 DIAGNOSIS — R7982 Elevated C-reactive protein (CRP): Secondary | ICD-10-CM | POA: Diagnosis not present

## 2019-03-22 DIAGNOSIS — E559 Vitamin D deficiency, unspecified: Secondary | ICD-10-CM | POA: Diagnosis not present

## 2019-03-22 DIAGNOSIS — I7300 Raynaud's syndrome without gangrene: Secondary | ICD-10-CM | POA: Diagnosis not present

## 2019-03-22 DIAGNOSIS — Z7689 Persons encountering health services in other specified circumstances: Secondary | ICD-10-CM | POA: Diagnosis not present

## 2019-03-22 DIAGNOSIS — I2510 Atherosclerotic heart disease of native coronary artery without angina pectoris: Secondary | ICD-10-CM | POA: Diagnosis not present

## 2019-03-22 DIAGNOSIS — E785 Hyperlipidemia, unspecified: Secondary | ICD-10-CM | POA: Diagnosis not present

## 2019-03-22 DIAGNOSIS — R61 Generalized hyperhidrosis: Secondary | ICD-10-CM | POA: Diagnosis not present

## 2019-03-30 ENCOUNTER — Other Ambulatory Visit: Payer: Self-pay

## 2019-03-30 ENCOUNTER — Encounter: Payer: Self-pay | Admitting: Cardiology

## 2019-03-30 ENCOUNTER — Ambulatory Visit (INDEPENDENT_AMBULATORY_CARE_PROVIDER_SITE_OTHER): Payer: PPO | Admitting: Cardiology

## 2019-03-30 VITALS — BP 163/78 | HR 62 | Temp 98.0°F | Ht 67.0 in | Wt 145.1 lb

## 2019-03-30 DIAGNOSIS — I2510 Atherosclerotic heart disease of native coronary artery without angina pectoris: Secondary | ICD-10-CM

## 2019-03-30 DIAGNOSIS — R0989 Other specified symptoms and signs involving the circulatory and respiratory systems: Secondary | ICD-10-CM | POA: Diagnosis not present

## 2019-03-30 DIAGNOSIS — E7800 Pure hypercholesterolemia, unspecified: Secondary | ICD-10-CM | POA: Diagnosis not present

## 2019-03-30 DIAGNOSIS — I10 Essential (primary) hypertension: Secondary | ICD-10-CM

## 2019-03-30 DIAGNOSIS — R9431 Abnormal electrocardiogram [ECG] [EKG]: Secondary | ICD-10-CM | POA: Diagnosis not present

## 2019-03-30 DIAGNOSIS — E875 Hyperkalemia: Secondary | ICD-10-CM | POA: Diagnosis not present

## 2019-03-30 MED ORDER — AMLODIPINE BESYLATE 5 MG PO TABS: 5.0000 mg | ORAL_TABLET | Freq: Every evening | ORAL | 2 refills | Status: DC

## 2019-03-30 MED ORDER — LOSARTAN POTASSIUM-HCTZ 50-12.5 MG PO TABS: 1.0000 | ORAL_TABLET | ORAL | 2 refills | Status: DC

## 2019-03-30 NOTE — Progress Notes (Signed)
Primary Physician/Referring:  Reynold Bowen, MD  Patient ID: Tara Bridges, female    DOB: 01-May-1936, 83 y.o.   MRN: [id number]  Chief Complaint  Patient presents with  . New Patient (Initial Visit)  . Hypertension   HPI:    HPI: Tara Bridges  is a 83 y.o. Fairly active Caucasian female was been referred for evaluation of recent exacerbation in hypertension which was previously well controlled.  Patient has remote history of smoking,  Past Medical History:  Diagnosis Date  . Diverticulosis of colon 2005   Dr Carlean Purl  . Fracture of ankle, closed 2012   boot, no surgery  . Hyperlipidemia   . Hypertension   . Osteopenia   . Raynaud's phenomenon   . Scoliosis   . Skin cancer    X 2  . Thyroid disease    hypothyroidism   Past Surgical History:  Procedure Laterality Date  . CATARACT EXTRACTION     bilaterally, Dr Katy Fitch  . COLONOSCOPY  2005   Dr Carlean Purl  . DILATION AND CURETTAGE OF UTERUS    . HYSTEROSCOPY     Dr Radene Knee  . I&D EXTREMITY  2004   infection L hand  . MOHS SURGERY  2011   L temple  . MOHS SURGERY  2014   R cheek  . TONSILLECTOMY    . uterine polyp     Social History   Socioeconomic History  . Marital status: Divorced    Spouse name: Not on file  . Number of children: 3  . Years of education: College  . Highest education level: Not on file  Occupational History  . Not on file  Social Needs  . Financial resource strain: Not on file  . Food insecurity    Worry: Not on file    Inability: Not on file  . Transportation needs    Medical: Not on file    Non-medical: Not on file  Tobacco Use  . Smoking status: Former Smoker    Packs/day: 0.25    Years: 10.00    Pack years: 2.50    Quit date: 09/06/1982    Years since quitting: 36.5  . Smokeless tobacco: Never Used  . Tobacco comment: Smoked [phone], up to < 1/2 ppd  Substance and Sexual Activity  . Alcohol use: Yes    Alcohol/week: 21.0 standard drinks    Types: 21 Glasses of wine per  week    Comment: wine  . Drug use: No  . Sexual activity: Not on file  Lifestyle  . Physical activity    Days per week: Not on file    Minutes per session: Not on file  . Stress: Not on file  Relationships  . Social Herbalist on phone: Not on file    Gets together: Not on file    Attends religious service: Not on file    Active member of club or organization: Not on file    Attends meetings of clubs or organizations: Not on file    Relationship status: Not on file  . Intimate partner violence    Fear of current or ex partner: Not on file    Emotionally abused: Not on file    Physically abused: Not on file    Forced sexual activity: Not on file  Other Topics Concern  . Not on file  Social History Narrative      Caffeine use: Drinks 1 cup coffee per day   Right handed  ROS  Review of Systems  Constitution: Negative for chills, decreased appetite, malaise/fatigue and weight gain.  Cardiovascular: Negative for dyspnea on exertion, leg swelling and syncope.  Endocrine: Negative for cold intolerance.  Hematologic/Lymphatic: Does not bruise/bleed easily.  Musculoskeletal: Negative for joint swelling.  Gastrointestinal: Negative for abdominal pain, anorexia, change in bowel habit, hematochezia and melena.  Neurological: Negative for headaches and light-headedness.  Psychiatric/Behavioral: Negative for depression and substance abuse.  All other systems reviewed and are negative.  Objective  Blood pressure (!) 163/78, pulse 62, temperature 98 F (36.7 C), height 5' 7"  (1.702 m), weight 145 lb 1.6 oz (65.8 kg), SpO2 96 %. Body mass index is 22.73 kg/m.   Physical Exam  Constitutional: She appears well-developed and well-nourished. No distress.  HENT:  Head: Atraumatic.  Eyes: Conjunctivae are normal.  Neck: Neck supple. No JVD present. No thyromegaly present.  Cardiovascular: Normal rate, regular rhythm, S1 normal, S2 normal, intact distal pulses and normal  pulses. Exam reveals no gallop.  Murmur heard.  Systolic murmur is present with a grade of 2/6 at the upper right sternal border. Pulses:      Carotid pulses are 2+ on the right side and 2+ on the left side with bruit. Pulmonary/Chest: Effort normal and breath sounds normal.  Abdominal: Soft. Bowel sounds are normal.  Musculoskeletal: Normal range of motion.        General: No edema.  Neurological: She is alert.  Skin: Skin is warm and dry.  Psychiatric: She has a normal mood and affect.   Radiology: No results found.  Laboratory examination:   Labs 03/22/2019: Serum glucose 79 mg, BUN 23, creatinine 0.9, eGFR greater than 60 mL, potassium 5.4.    TSH 0.76, normal, normal free T4.  Vitamin D 73.  C-reactive protein elevated, I do not have the value.  CMP Latest Ref Rng & Units 08/01/2017 07/12/2016 07/07/2015  Glucose 70 - 99 mg/dL 90 93 89  BUN 6 - 23 mg/dL 20 23 20   Creatinine 0.40 - 1.20 mg/dL 0.85 0.98 0.89  Sodium 135 - 145 mEq/L 137 139 139  Potassium 3.5 - 5.1 mEq/L 4.5 4.9 4.5  Chloride 96 - 112 mEq/L 100 103 104  CO2 19 - 32 mEq/L 29 28 28   Calcium 8.4 - 10.5 mg/dL 9.9 9.8 9.3  Total Protein 6.0 - 8.3 g/dL 7.3 7.0 7.3  Total Bilirubin 0.2 - 1.2 mg/dL 0.4 0.5 0.5  Alkaline Phos 39 - 117 U/L 58 48 46  AST 0 - 37 U/L 13 16 16   ALT 0 - 35 U/L 9 13 12    CBC Latest Ref Rng & Units 08/01/2017 07/12/2016 07/07/2015  WBC 4.0 - 10.5 K/uL 7.1 4.9 4.4  Hemoglobin 12.0 - 15.0 g/dL 12.6 14.1 14.1  Hematocrit 36.0 - 46.0 % 38.8 41.9 42.5  Platelets 150.0 - 400.0 K/uL 316.0 234.0 218.0   Lipid Panel     Component Value Date/Time   CHOL 190 08/01/2017 1433   CHOL 236 (H) 07/01/2014 1153   TRIG 83.0 08/01/2017 1433   TRIG 55 07/01/2014 1153   TRIG 81 07/15/2006 0000   HDL 60.00 08/01/2017 1433   HDL 103 07/01/2014 1153   CHOLHDL 3 08/01/2017 1433   VLDL 16.6 08/01/2017 1433   LDLCALC 113 (H) 08/01/2017 1433   LDLCALC 122 (H) 07/01/2014 1153   LDLDIRECT 138.4 06/18/2013  1043   HEMOGLOBIN A1C No results found for: HGBA1C, MPG TSH No results for input(s): TSH in the last 8760 hours. Medications  Current Outpatient Medications  Medication Instructions  . amLODipine (NORVASC) 5 mg, Oral, Every evening  . Biotin 300 MCG TABS Daily  . Calcium Carbonate-Vit D-Min (CALCIUM 1200 PO) Take by mouth. Viactiv   . Cholecalciferol (VITAMIN D-3) 5000 UNITS TABS Daily  . cycloSPORINE (RESTASIS) 0.05 % ophthalmic emulsion 1 drop, 2 times daily  . KRILL OIL PO 2 capsules, Oral, Daily  . losartan-hydrochlorothiazide (HYZAAR) 50-12.5 MG tablet 1 tablet, Oral, BH-each morning  . magnesium chloride (SLOW-MAG) 64 MG TBEC 1 tablet, Daily  . meclizine (ANTIVERT) 12.5 mg, Oral, 3 times daily PRN  . meloxicam (MOBIC) 7.5 MG tablet TAKE 1 TABLET BY MOUTH ONE TO TWO TIMES DAILY AS NEEDED  . Multiple Vitamin (MULTIVITAMIN) tablet 1 tablet, Daily  . NON FORMULARY 1 spray, Sublingual, 2 times daily  . PREMARIN vaginal cream USE 2 GRAMS VAGINALLY EVERY NIGHT AT BEDTIME 1 -2 TIMES PER WEEK  . SYNTHROID 88 MCG tablet TAKE 1 TABLET BY MOUTH, EXCEPT TAKE 1/2 TABLET DAILY MONDAYS TUES., THURS., AND SATURDAY.    Cardiac Studies:   CTT Chest 10/21/2017:  Cardiovascular: Normal branch pattern of the great vessels. No large central pulmonary embolus. Ectatic ascending thoracic aorta. No aneurysm. Heart size is normal trace pericardial effusion. There is coronary arteriosclerosis most evident along the LAD and RCA.  Assessment     ICD-10-CM   1. Primary hypertension  I10 EKG 12-Lead    amLODipine (NORVASC) 5 MG tablet    losartan-hydrochlorothiazide (HYZAAR) 50-12.5 MG tablet    Basic metabolic panel  2. Coronary artery calcification seen on CAT scan  I25.10    10/21/2017: LAD and RCA  3. Nonspecific abnormal electrocardiogram (ECG) (EKG)  R94.31   4. Left carotid bruit  R09.89 PCV CAROTID DUPLEX (BILATERAL)  5. Hypercholesteremia  E78.00 Lipid Panel With LDL/HDL Ratio     Lipoprotein A (LPA)    EKG 03/30/2019: Normal sinus rhythm at the rate of 63 bpm, leftward axis.  Poor R-wave progression, cannot exclude anteroseptal infarct old.  Diffuse nonspecific T abnormality.  Recommendations:   Patient essentially asymptomatic except for elevated blood pressure, states that she feels well and remains active presently 83 years of age.  I reviewed her labs, do not see recent lipid profile, will obtain lipid profile testing along with LPA.  I also reviewed her CT scan of the chest performed last year which reveals coronary calcification involving the LAD and RCA.  She also has carotid artery bruit, needs carotid artery duplex.  With regard to hypertension, I discontinued lisinopril and switched her to losartan HCT 50/2.5 mg in the morning and added amlodipine 5 mg the evening.  I'd like to see her back after the test and I'll make further recommendations.  I was only able to see the CT report after the patient had left, I will discuss this further on her next office visit. In view of abnormal EKG and her risk factors, I may consider ischemic work up, at least a routine GXT is probably indicated.  Obtain echocardiogram now.   Adrian Prows, MD, [facility] 04/03/2019, 8:51 AM [facility] [place] Pager: [phone] Office: [phone] If no answer Cell [phone]

## 2019-04-03 ENCOUNTER — Encounter: Payer: Self-pay | Admitting: Cardiology

## 2019-04-04 ENCOUNTER — Telehealth: Payer: Self-pay

## 2019-04-04 NOTE — Telephone Encounter (Signed)
Pt called worried about her BP being 162 in the evenings; she hasnt been on the new meds for 10 days yet so I assured her since no symptoms lets wait the full 10 days until she goes and get blood work then we can talk about the bp readings; She will call back as needed

## 2019-04-11 DIAGNOSIS — E7800 Pure hypercholesterolemia, unspecified: Secondary | ICD-10-CM | POA: Diagnosis not present

## 2019-04-11 DIAGNOSIS — I10 Essential (primary) hypertension: Secondary | ICD-10-CM | POA: Diagnosis not present

## 2019-04-12 LAB — LIPID PANEL WITH LDL/HDL RATIO
Cholesterol, Total: 213 mg/dL — ABNORMAL HIGH (ref 100–199)
HDL: 87 mg/dL (ref >39)
LDL Calculated: 113 mg/dL — ABNORMAL HIGH (ref 0–99)
LDl/HDL Ratio: 1.3 ratio (ref 0.0–3.2)
Triglycerides: 67 mg/dL (ref 0–149)
VLDL Cholesterol Cal: 13 mg/dL (ref 5–40)

## 2019-04-12 LAB — BASIC METABOLIC PANEL
BUN/Creatinine Ratio: 23 (ref 12–28)
BUN: 22 mg/dL (ref 8–27)
CO2: 24 mmol/L (ref 20–29)
Calcium: 10.1 mg/dL (ref 8.7–10.3)
Chloride: 98 mmol/L (ref 96–106)
Creatinine, Ser: 0.96 mg/dL (ref 0.57–1.00)
GFR calc Af Amer: 64 mL/min/{1.73_m2} (ref >59)
GFR calc non Af Amer: 56 mL/min/{1.73_m2} — ABNORMAL LOW (ref >59)
Glucose: 93 mg/dL (ref 65–99)
Potassium: 6.1 mmol/L — ABNORMAL HIGH (ref 3.5–5.2)
Sodium: 136 mmol/L (ref 134–144)

## 2019-04-12 LAB — LIPOPROTEIN A (LPA): Lipoprotein (a): 162.8 nmol/L — ABNORMAL HIGH (ref <75.0)

## 2019-04-16 NOTE — Addendum Note (Signed)
Addended by: Kela Millin on: 04/16/2019 09:50 PM   Modules accepted: Orders

## 2019-04-19 ENCOUNTER — Telehealth: Payer: Self-pay

## 2019-04-19 DIAGNOSIS — I10 Essential (primary) hypertension: Secondary | ICD-10-CM

## 2019-04-19 MED ORDER — HYDROCHLOROTHIAZIDE 12.5 MG PO CAPS: 12.5000 mg | ORAL_CAPSULE | Freq: Every day | ORAL | 1 refills | Status: DC

## 2019-04-19 MED ORDER — HYDROCHLOROTHIAZIDE 12.5 MG PO CAPS: 12.5000 mg | ORAL_CAPSULE | Freq: Every day | ORAL | 3 refills | Status: DC

## 2019-04-19 NOTE — Telephone Encounter (Signed)
Ok just put order in she will get it done at her pcp office she doesn't like [facility]

## 2019-04-19 NOTE — Telephone Encounter (Signed)
Pt daughter called stating that her mom had blood work and her potassium level is high 6.1 and JG put her on a new med ? She needs to be seen asap?

## 2019-04-19 NOTE — Addendum Note (Signed)
Addended by: Gwinda Maine on: 04/19/2019 02:36 PM   Modules accepted: Orders

## 2019-04-19 NOTE — Telephone Encounter (Signed)
Have her stop the Losartan HCT, and just start hydrochlorthiazide. Repeat BMP in 1 week. No need to come in, there is not anything we can do in the office. Losartan can make your potassium levels go up.

## 2019-04-30 ENCOUNTER — Other Ambulatory Visit: Payer: Self-pay

## 2019-04-30 ENCOUNTER — Ambulatory Visit (INDEPENDENT_AMBULATORY_CARE_PROVIDER_SITE_OTHER): Payer: PPO

## 2019-04-30 DIAGNOSIS — R9431 Abnormal electrocardiogram [ECG] [EKG]: Secondary | ICD-10-CM | POA: Diagnosis not present

## 2019-04-30 DIAGNOSIS — I10 Essential (primary) hypertension: Secondary | ICD-10-CM

## 2019-04-30 DIAGNOSIS — R0989 Other specified symptoms and signs involving the circulatory and respiratory systems: Secondary | ICD-10-CM

## 2019-05-01 ENCOUNTER — Other Ambulatory Visit: Payer: Self-pay

## 2019-05-01 DIAGNOSIS — I10 Essential (primary) hypertension: Secondary | ICD-10-CM

## 2019-05-01 MED ORDER — LOSARTAN POTASSIUM-HCTZ 50-12.5 MG PO TABS: 1.0000 | ORAL_TABLET | ORAL | 2 refills | Status: DC

## 2019-05-01 NOTE — Telephone Encounter (Signed)
Pt daughter called and stated that she doesn't like her mothers PCP doesn't feel he is a patient advocate but requested if we could write an order for her labs to be done at her PCP instead of [facility]. I did advise daughter that I couldn't write an order for her PCP to do her labs there but I could request it. Daughter stated that she doesn't think the provider will do the labs for her mom. She also stated that no one advised her about the labs for her mom. Daughter stated sh will just have her mom go to [facility] to have BMP rechecked. Sent medication refill to the pharmacy bp still running high.

## 2019-05-02 DIAGNOSIS — I10 Essential (primary) hypertension: Secondary | ICD-10-CM | POA: Diagnosis not present

## 2019-05-04 ENCOUNTER — Telehealth: Payer: Self-pay

## 2019-05-04 ENCOUNTER — Other Ambulatory Visit: Payer: Self-pay | Admitting: Cardiology

## 2019-05-04 ENCOUNTER — Other Ambulatory Visit: Payer: Self-pay

## 2019-05-04 DIAGNOSIS — I10 Essential (primary) hypertension: Secondary | ICD-10-CM

## 2019-05-04 MED ORDER — AMLODIPINE BESYLATE 10 MG PO TABS: 10.0000 mg | ORAL_TABLET | Freq: Every evening | ORAL | 1 refills | Status: DC

## 2019-05-04 MED ORDER — AMLODIPINE BESYLATE 5 MG PO TABS: 5.0000 mg | ORAL_TABLET | Freq: Every evening | ORAL | 2 refills | Status: DC

## 2019-05-04 MED ORDER — METOPROLOL SUCCINATE ER 25 MG PO TB24: 25.0000 mg | ORAL_TABLET | Freq: Every day | ORAL | 2 refills | Status: DC

## 2019-05-04 NOTE — Telephone Encounter (Signed)
Pt called and said that her BP has been very high 199/97 and 184/98 FYI, She takes Her Amlodipine at night and her Hctz in the morning.

## 2019-05-07 DIAGNOSIS — I5189 Other ill-defined heart diseases: Secondary | ICD-10-CM | POA: Diagnosis not present

## 2019-05-07 DIAGNOSIS — E039 Hypothyroidism, unspecified: Secondary | ICD-10-CM | POA: Diagnosis not present

## 2019-05-07 DIAGNOSIS — M8580 Other specified disorders of bone density and structure, unspecified site: Secondary | ICD-10-CM | POA: Diagnosis not present

## 2019-05-07 DIAGNOSIS — E871 Hypo-osmolality and hyponatremia: Secondary | ICD-10-CM | POA: Diagnosis not present

## 2019-05-07 DIAGNOSIS — N183 Chronic kidney disease, stage 3 (moderate): Secondary | ICD-10-CM | POA: Diagnosis not present

## 2019-05-07 DIAGNOSIS — I10 Essential (primary) hypertension: Secondary | ICD-10-CM | POA: Diagnosis not present

## 2019-05-07 DIAGNOSIS — R7982 Elevated C-reactive protein (CRP): Secondary | ICD-10-CM | POA: Diagnosis not present

## 2019-05-07 DIAGNOSIS — E785 Hyperlipidemia, unspecified: Secondary | ICD-10-CM | POA: Diagnosis not present

## 2019-05-07 NOTE — Telephone Encounter (Signed)
read

## 2019-05-15 ENCOUNTER — Ambulatory Visit (INDEPENDENT_AMBULATORY_CARE_PROVIDER_SITE_OTHER): Payer: PPO | Admitting: Cardiology

## 2019-05-15 ENCOUNTER — Other Ambulatory Visit: Payer: Self-pay

## 2019-05-15 ENCOUNTER — Encounter: Payer: Self-pay | Admitting: Cardiology

## 2019-05-15 VITALS — BP 126/73 | HR 46 | Ht 67.0 in | Wt 147.0 lb

## 2019-05-15 DIAGNOSIS — I10 Essential (primary) hypertension: Secondary | ICD-10-CM | POA: Diagnosis not present

## 2019-05-15 DIAGNOSIS — E7800 Pure hypercholesterolemia, unspecified: Secondary | ICD-10-CM

## 2019-05-15 DIAGNOSIS — R0989 Other specified symptoms and signs involving the circulatory and respiratory systems: Secondary | ICD-10-CM

## 2019-05-15 DIAGNOSIS — R0602 Shortness of breath: Secondary | ICD-10-CM

## 2019-05-15 DIAGNOSIS — I2510 Atherosclerotic heart disease of native coronary artery without angina pectoris: Secondary | ICD-10-CM | POA: Diagnosis not present

## 2019-05-15 MED ORDER — ASPIRIN EC 81 MG PO TBEC: 81.0000 mg | DELAYED_RELEASE_TABLET | Freq: Every day | ORAL | 3 refills | Status: DC

## 2019-05-15 MED ORDER — ATORVASTATIN CALCIUM 20 MG PO TABS: 20.0000 mg | ORAL_TABLET | Freq: Every day | ORAL | 2 refills | Status: DC
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
## 2019-05-21 DIAGNOSIS — Z124 Encounter for screening for malignant neoplasm of cervix: Secondary | ICD-10-CM | POA: Diagnosis not present

## 2019-05-21 DIAGNOSIS — Z6823 Body mass index (BMI) 23.0-23.9, adult: Secondary | ICD-10-CM | POA: Diagnosis not present

## 2019-05-24 DIAGNOSIS — Z961 Presence of intraocular lens: Secondary | ICD-10-CM | POA: Diagnosis not present

## 2019-05-24 DIAGNOSIS — H5213 Myopia, bilateral: Secondary | ICD-10-CM | POA: Diagnosis not present

## 2019-05-24 DIAGNOSIS — H43391 Other vitreous opacities, right eye: Secondary | ICD-10-CM | POA: Diagnosis not present

## 2019-05-24 DIAGNOSIS — H524 Presbyopia: Secondary | ICD-10-CM | POA: Diagnosis not present

## 2019-05-24 DIAGNOSIS — H26491 Other secondary cataract, right eye: Secondary | ICD-10-CM | POA: Diagnosis not present

## 2019-05-24 DIAGNOSIS — H52203 Unspecified astigmatism, bilateral: Secondary | ICD-10-CM | POA: Diagnosis not present

## 2019-05-24 DIAGNOSIS — H5310 Unspecified subjective visual disturbances: Secondary | ICD-10-CM | POA: Diagnosis not present

## 2019-05-28 DIAGNOSIS — Z1211 Encounter for screening for malignant neoplasm of colon: Secondary | ICD-10-CM | POA: Diagnosis not present

## 2019-05-29 DIAGNOSIS — I10 Essential (primary) hypertension: Secondary | ICD-10-CM | POA: Diagnosis not present

## 2019-05-29 DIAGNOSIS — E7800 Pure hypercholesterolemia, unspecified: Secondary | ICD-10-CM | POA: Diagnosis not present

## 2019-05-30 LAB — CMP14+EGFR
ALT: 18 IU/L (ref 0–32)
AST: 14 IU/L (ref 0–40)
Albumin/Globulin Ratio: 1.5 (ref 1.2–2.2)
Albumin: 4.4 g/dL (ref 3.6–4.6)
Alkaline Phosphatase: 80 IU/L (ref 39–117)
BUN/Creatinine Ratio: 24 (ref 12–28)
BUN: 29 mg/dL — ABNORMAL HIGH (ref 8–27)
Bilirubin Total: 0.4 mg/dL (ref 0.0–1.2)
CO2: 25 mmol/L (ref 20–29)
Calcium: 10 mg/dL (ref 8.7–10.3)
Chloride: 103 mmol/L (ref 96–106)
Creatinine, Ser: 1.22 mg/dL — ABNORMAL HIGH (ref 0.57–1.00)
GFR calc Af Amer: 48 mL/min/{1.73_m2} — ABNORMAL LOW (ref >59)
GFR calc non Af Amer: 42 mL/min/{1.73_m2} — ABNORMAL LOW (ref >59)
Globulin, Total: 2.9 g/dL (ref 1.5–4.5)
Glucose: 81 mg/dL (ref 65–99)
Potassium: 5 mmol/L (ref 3.5–5.2)
Sodium: 140 mmol/L (ref 134–144)
Total Protein: 7.3 g/dL (ref 6.0–8.5)

## 2019-05-30 LAB — LIPID PANEL WITH LDL/HDL RATIO
Cholesterol, Total: 167 mg/dL (ref 100–199)
HDL: 80 mg/dL (ref >39)
LDL Chol Calc (NIH): 74 mg/dL (ref 0–99)
LDL/HDL Ratio: 0.9 ratio (ref 0.0–3.2)
Triglycerides: 66 mg/dL (ref 0–149)
VLDL Cholesterol Cal: 13 mg/dL (ref 5–40)

## 2019-05-30 LAB — LDL CHOLESTEROL, DIRECT: LDL Direct: 76 mg/dL (ref 0–99)

## 2019-05-31 ENCOUNTER — Other Ambulatory Visit: Payer: Self-pay | Admitting: Cardiology

## 2019-05-31 ENCOUNTER — Telehealth: Payer: Self-pay

## 2019-05-31 NOTE — Telephone Encounter (Signed)
Advised patient to discontinue Losartan HCTZ and have labs drawn 4-5 days prior to her next visit on 06/26/19 per Dr. Einar Gip.

## 2019-05-31 NOTE — Progress Notes (Signed)
Her labs were reviewed, I have discontinued losartan HCT.  We will recheck BMP in 2 weeks.  I have sent her my chart message, will also have nurses call her.

## 2019-05-31 NOTE — Telephone Encounter (Signed)
-----   Message from Adrian Prows, MD sent at 05/31/2019  3:25 PM EDT ----- Regarding: Medication change and lab Labs show worsening renal function. D/C Losartan HCT and she has to get labs 4-5 days prior to her visit with me. I have sent her message, just making sure. Other lags great.

## 2019-05-31 NOTE — Addendum Note (Signed)
Addended by: Kela Millin on: 05/31/2019 03:13 PM   Modules accepted: Orders

## 2019-06-04 ENCOUNTER — Other Ambulatory Visit: Payer: Self-pay

## 2019-06-04 ENCOUNTER — Ambulatory Visit (INDEPENDENT_AMBULATORY_CARE_PROVIDER_SITE_OTHER): Payer: PPO

## 2019-06-04 DIAGNOSIS — R0602 Shortness of breath: Secondary | ICD-10-CM

## 2019-06-04 DIAGNOSIS — I2510 Atherosclerotic heart disease of native coronary artery without angina pectoris: Secondary | ICD-10-CM

## 2019-06-04 DIAGNOSIS — R0989 Other specified symptoms and signs involving the circulatory and respiratory systems: Secondary | ICD-10-CM

## 2019-06-07 ENCOUNTER — Telehealth: Payer: Self-pay

## 2019-06-07 NOTE — Telephone Encounter (Signed)
Pt called and said her BP is still high at night and her ankles swollen , please advise

## 2019-06-07 NOTE — Telephone Encounter (Signed)
She is on HCTZ 12.5 mg daily, increase to 25 mg daily.

## 2019-06-08 ENCOUNTER — Other Ambulatory Visit: Payer: Self-pay

## 2019-06-08 DIAGNOSIS — I10 Essential (primary) hypertension: Secondary | ICD-10-CM

## 2019-06-08 MED ORDER — HYDROCHLOROTHIAZIDE 12.5 MG PO TABS: 12.5000 mg | ORAL_TABLET | Freq: Every day | ORAL | 1 refills | Status: DC

## 2019-06-08 NOTE — Telephone Encounter (Signed)
Turns out patient wasn't taking the HCTZ. She claimed to have never been taking it. I sent her in 12.5 mg HCTZ.

## 2019-06-15 DIAGNOSIS — Z23 Encounter for immunization: Secondary | ICD-10-CM | POA: Diagnosis not present

## 2019-06-19 DIAGNOSIS — I10 Essential (primary) hypertension: Secondary | ICD-10-CM | POA: Diagnosis not present

## 2019-06-20 LAB — BASIC METABOLIC PANEL
BUN/Creatinine Ratio: 20 (ref 12–28)
BUN: 21 mg/dL (ref 8–27)
CO2: 26 mmol/L (ref 20–29)
Calcium: 10.2 mg/dL (ref 8.7–10.3)
Chloride: 100 mmol/L (ref 96–106)
Creatinine, Ser: 1.04 mg/dL — ABNORMAL HIGH (ref 0.57–1.00)
GFR calc Af Amer: 58 mL/min/{1.73_m2} — ABNORMAL LOW (ref >59)
GFR calc non Af Amer: 51 mL/min/{1.73_m2} — ABNORMAL LOW (ref >59)
Glucose: 86 mg/dL (ref 65–99)
Potassium: 4.8 mmol/L (ref 3.5–5.2)
Sodium: 141 mmol/L (ref 134–144)

## 2019-06-26 ENCOUNTER — Ambulatory Visit (INDEPENDENT_AMBULATORY_CARE_PROVIDER_SITE_OTHER): Payer: PPO | Admitting: Cardiology

## 2019-06-26 ENCOUNTER — Other Ambulatory Visit: Payer: Self-pay

## 2019-06-26 ENCOUNTER — Encounter: Payer: Self-pay | Admitting: Cardiology

## 2019-06-26 VITALS — BP 150/72 | HR 72 | Temp 96.9°F | Ht 67.0 in | Wt 145.0 lb

## 2019-06-26 DIAGNOSIS — I10 Essential (primary) hypertension: Secondary | ICD-10-CM

## 2019-06-26 DIAGNOSIS — E7800 Pure hypercholesterolemia, unspecified: Secondary | ICD-10-CM | POA: Diagnosis not present

## 2019-06-26 DIAGNOSIS — I2510 Atherosclerotic heart disease of native coronary artery without angina pectoris: Secondary | ICD-10-CM | POA: Diagnosis not present

## 2019-06-26 MED ORDER — LISINOPRIL-HYDROCHLOROTHIAZIDE 20-12.5 MG PO TABS: 1.0000 | ORAL_TABLET | Freq: Every morning | ORAL | 1 refills | Status: DC
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
## 2019-07-19 ENCOUNTER — Telehealth: Payer: Self-pay

## 2019-07-19 NOTE — Telephone Encounter (Signed)
She is taking magnesium supplements and this could be the reason

## 2019-07-19 NOTE — Telephone Encounter (Signed)
The patient called and said that she is having very loose stool and also her legs are very achy. She thinks it could be from some of her meds, please advise.

## 2019-07-20 NOTE — Telephone Encounter (Signed)
She stopped taking cholesterol meds and magnesium

## 2019-07-30 ENCOUNTER — Telehealth: Payer: Self-pay

## 2019-07-30 NOTE — Telephone Encounter (Signed)
The patient called back and said her leg cramps are much better after stopping the Atorvastatin. She is going to try a holistic approach.

## 2019-08-01 DIAGNOSIS — I10 Essential (primary) hypertension: Secondary | ICD-10-CM | POA: Diagnosis not present

## 2019-08-02 LAB — BASIC METABOLIC PANEL
BUN/Creatinine Ratio: 26 (ref 12–28)
BUN: 28 mg/dL — ABNORMAL HIGH (ref 8–27)
CO2: 20 mmol/L (ref 20–29)
Calcium: 9.6 mg/dL (ref 8.7–10.3)
Chloride: 99 mmol/L (ref 96–106)
Creatinine, Ser: 1.08 mg/dL — ABNORMAL HIGH (ref 0.57–1.00)
GFR calc Af Amer: 56 mL/min/{1.73_m2} — ABNORMAL LOW (ref >59)
GFR calc non Af Amer: 48 mL/min/{1.73_m2} — ABNORMAL LOW (ref >59)
Glucose: 87 mg/dL (ref 65–99)
Potassium: 5.2 mmol/L (ref 3.5–5.2)
Sodium: 137 mmol/L (ref 134–144)

## 2019-08-07 ENCOUNTER — Ambulatory Visit (INDEPENDENT_AMBULATORY_CARE_PROVIDER_SITE_OTHER): Payer: PPO | Admitting: Cardiology

## 2019-08-07 ENCOUNTER — Other Ambulatory Visit: Payer: Self-pay

## 2019-08-07 ENCOUNTER — Encounter: Payer: Self-pay | Admitting: Cardiology

## 2019-08-07 VITALS — BP 128/59 | HR 65 | Temp 97.3°F | Ht 67.0 in | Wt 137.0 lb

## 2019-08-07 DIAGNOSIS — I10 Essential (primary) hypertension: Secondary | ICD-10-CM

## 2019-08-07 DIAGNOSIS — E7800 Pure hypercholesterolemia, unspecified: Secondary | ICD-10-CM

## 2019-08-07 MED ORDER — HYDRALAZINE HCL 25 MG PO TABS: 25.0000 mg | ORAL_TABLET | Freq: Three times a day (TID) | ORAL | 2 refills | Status: DC | PRN

## 2019-08-07 MED ORDER — FLUVASTATIN SODIUM 40 MG PO CAPS: 40.0000 mg | ORAL_CAPSULE | Freq: Every day | ORAL | 2 refills | Status: DC
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
## 2019-08-21 DIAGNOSIS — C44519 Basal cell carcinoma of skin of other part of trunk: Secondary | ICD-10-CM | POA: Diagnosis not present

## 2019-08-21 DIAGNOSIS — D485 Neoplasm of uncertain behavior of skin: Secondary | ICD-10-CM | POA: Diagnosis not present

## 2019-08-21 DIAGNOSIS — L578 Other skin changes due to chronic exposure to nonionizing radiation: Secondary | ICD-10-CM | POA: Diagnosis not present

## 2019-08-21 DIAGNOSIS — I788 Other diseases of capillaries: Secondary | ICD-10-CM | POA: Diagnosis not present

## 2019-08-21 DIAGNOSIS — L821 Other seborrheic keratosis: Secondary | ICD-10-CM | POA: Diagnosis not present

## 2019-08-21 DIAGNOSIS — Z85828 Personal history of other malignant neoplasm of skin: Secondary | ICD-10-CM | POA: Diagnosis not present

## 2019-08-21 DIAGNOSIS — D1801 Hemangioma of skin and subcutaneous tissue: Secondary | ICD-10-CM | POA: Diagnosis not present

## 2019-08-25 ENCOUNTER — Other Ambulatory Visit: Payer: Self-pay | Admitting: Cardiology

## 2019-08-25 DIAGNOSIS — I10 Essential (primary) hypertension: Secondary | ICD-10-CM

## 2019-08-27 ENCOUNTER — Telehealth: Payer: Self-pay

## 2019-08-27 ENCOUNTER — Other Ambulatory Visit: Payer: Self-pay | Admitting: Cardiology

## 2019-08-27 DIAGNOSIS — I10 Essential (primary) hypertension: Secondary | ICD-10-CM

## 2019-08-27 MED ORDER — LISINOPRIL 20 MG PO TABS: 20.0000 mg | ORAL_TABLET | Freq: Every day | ORAL | 2 refills | Status: DC

## 2019-08-27 NOTE — Telephone Encounter (Signed)
Yes  I sent in the Rx for 20 mg daily

## 2019-08-27 NOTE — Telephone Encounter (Signed)
Pt called and said that her BP is still high taking the hydralazine , she asked if she could try and go back to just Lisiniopril.she believes the lisinopril hydrochlorothiazide was the cause of her diarrhea.  Her BP is still 152/58 and 143/75 and 133 /75 , please advise.

## 2019-09-17 DIAGNOSIS — Z1231 Encounter for screening mammogram for malignant neoplasm of breast: Secondary | ICD-10-CM | POA: Diagnosis not present

## 2019-09-24 DIAGNOSIS — E785 Hyperlipidemia, unspecified: Secondary | ICD-10-CM | POA: Diagnosis not present

## 2019-09-24 DIAGNOSIS — I10 Essential (primary) hypertension: Secondary | ICD-10-CM | POA: Diagnosis not present

## 2019-09-24 DIAGNOSIS — E871 Hypo-osmolality and hyponatremia: Secondary | ICD-10-CM | POA: Diagnosis not present

## 2019-09-24 DIAGNOSIS — I5189 Other ill-defined heart diseases: Secondary | ICD-10-CM | POA: Diagnosis not present

## 2019-09-24 DIAGNOSIS — M8580 Other specified disorders of bone density and structure, unspecified site: Secondary | ICD-10-CM | POA: Diagnosis not present

## 2019-09-24 DIAGNOSIS — I779 Disorder of arteries and arterioles, unspecified: Secondary | ICD-10-CM | POA: Diagnosis not present

## 2019-09-24 DIAGNOSIS — E039 Hypothyroidism, unspecified: Secondary | ICD-10-CM | POA: Diagnosis not present

## 2019-09-24 DIAGNOSIS — N1831 Chronic kidney disease, stage 3a: Secondary | ICD-10-CM | POA: Diagnosis not present

## 2019-10-05 ENCOUNTER — Encounter: Payer: Self-pay | Admitting: Internal Medicine

## 2019-10-24 ENCOUNTER — Other Ambulatory Visit: Payer: Self-pay | Admitting: Cardiology

## 2019-10-24 DIAGNOSIS — I10 Essential (primary) hypertension: Secondary | ICD-10-CM

## 2019-11-22 ENCOUNTER — Ambulatory Visit: Payer: PPO | Admitting: Internal Medicine

## 2019-11-22 ENCOUNTER — Encounter: Payer: Self-pay | Admitting: Internal Medicine

## 2019-11-22 ENCOUNTER — Other Ambulatory Visit: Payer: Self-pay

## 2019-11-22 VITALS — BP 118/64 | HR 52 | Temp 98.2°F | Ht 67.0 in | Wt 146.8 lb

## 2019-11-22 DIAGNOSIS — K641 Second degree hemorrhoids: Secondary | ICD-10-CM | POA: Diagnosis not present

## 2019-11-22 DIAGNOSIS — N8110 Cystocele, unspecified: Secondary | ICD-10-CM | POA: Diagnosis not present

## 2019-11-22 DIAGNOSIS — R159 Full incontinence of feces: Secondary | ICD-10-CM | POA: Diagnosis not present
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
## 2019-11-22 NOTE — Patient Instructions (Signed)
Please follow up with Dr Radene Knee.   Try one tablespoon of benefiber daily.   I appreciate the opportunity to care for you. Silvano Rusk, MD, [facility]

## 2019-11-30 ENCOUNTER — Other Ambulatory Visit: Payer: Self-pay | Admitting: Cardiology

## 2019-11-30 DIAGNOSIS — I10 Essential (primary) hypertension: Secondary | ICD-10-CM

## 2019-12-11 DIAGNOSIS — R5383 Other fatigue: Secondary | ICD-10-CM | POA: Diagnosis not present

## 2019-12-11 DIAGNOSIS — N390 Urinary tract infection, site not specified: Secondary | ICD-10-CM | POA: Diagnosis not present

## 2019-12-11 DIAGNOSIS — R350 Frequency of micturition: Secondary | ICD-10-CM | POA: Diagnosis not present

## 2019-12-11 DIAGNOSIS — D509 Iron deficiency anemia, unspecified: Secondary | ICD-10-CM | POA: Diagnosis not present

## 2019-12-19 DIAGNOSIS — N390 Urinary tract infection, site not specified: Secondary | ICD-10-CM | POA: Diagnosis not present
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
## 2019-12-28 ENCOUNTER — Ambulatory Visit: Payer: PPO | Admitting: Cardiology

## 2019-12-28 ENCOUNTER — Encounter: Payer: Self-pay | Admitting: Cardiology

## 2019-12-28 ENCOUNTER — Other Ambulatory Visit: Payer: Self-pay

## 2019-12-28 VITALS — BP 141/67 | HR 62 | Temp 97.6°F | Ht 67.0 in | Wt 144.3 lb

## 2019-12-28 DIAGNOSIS — I10 Essential (primary) hypertension: Secondary | ICD-10-CM

## 2019-12-28 DIAGNOSIS — I2510 Atherosclerotic heart disease of native coronary artery without angina pectoris: Secondary | ICD-10-CM

## 2019-12-28 DIAGNOSIS — E7800 Pure hypercholesterolemia, unspecified: Secondary | ICD-10-CM | POA: Diagnosis not present

## 2019-12-28 MED ORDER — ATORVASTATIN CALCIUM 10 MG PO TABS: 10.0000 mg | ORAL_TABLET | Freq: Every day | ORAL | 3 refills | Status: DC

## 2020-02-05 DIAGNOSIS — L821 Other seborrheic keratosis: Secondary | ICD-10-CM | POA: Diagnosis not present

## 2020-02-05 DIAGNOSIS — Z85828 Personal history of other malignant neoplasm of skin: Secondary | ICD-10-CM | POA: Diagnosis not present

## 2020-03-26 DIAGNOSIS — E039 Hypothyroidism, unspecified: Secondary | ICD-10-CM | POA: Diagnosis not present

## 2020-03-26 DIAGNOSIS — E871 Hypo-osmolality and hyponatremia: Secondary | ICD-10-CM | POA: Diagnosis not present

## 2020-03-26 DIAGNOSIS — E559 Vitamin D deficiency, unspecified: Secondary | ICD-10-CM | POA: Diagnosis not present

## 2020-03-26 DIAGNOSIS — M8580 Other specified disorders of bone density and structure, unspecified site: Secondary | ICD-10-CM | POA: Diagnosis not present

## 2020-03-26 DIAGNOSIS — I5189 Other ill-defined heart diseases: Secondary | ICD-10-CM | POA: Diagnosis not present

## 2020-03-26 DIAGNOSIS — I779 Disorder of arteries and arterioles, unspecified: Secondary | ICD-10-CM | POA: Diagnosis not present

## 2020-03-26 DIAGNOSIS — E785 Hyperlipidemia, unspecified: Secondary | ICD-10-CM | POA: Diagnosis not present

## 2020-03-26 DIAGNOSIS — I2510 Atherosclerotic heart disease of native coronary artery without angina pectoris: Secondary | ICD-10-CM | POA: Diagnosis not present

## 2020-03-26 DIAGNOSIS — N1831 Chronic kidney disease, stage 3a: Secondary | ICD-10-CM | POA: Diagnosis not present

## 2020-03-26 DIAGNOSIS — I10 Essential (primary) hypertension: Secondary | ICD-10-CM | POA: Diagnosis not present

## 2020-03-26 DIAGNOSIS — K5730 Diverticulosis of large intestine without perforation or abscess without bleeding: Secondary | ICD-10-CM | POA: Diagnosis not present

## 2020-03-28 ENCOUNTER — Other Ambulatory Visit: Payer: Self-pay | Admitting: Cardiology

## 2020-03-28 DIAGNOSIS — I10 Essential (primary) hypertension: Secondary | ICD-10-CM

## 2020-05-07 ENCOUNTER — Other Ambulatory Visit: Payer: Self-pay | Admitting: Cardiology

## 2020-05-07 DIAGNOSIS — I10 Essential (primary) hypertension: Secondary | ICD-10-CM

## 2020-05-26 DIAGNOSIS — R350 Frequency of micturition: Secondary | ICD-10-CM | POA: Diagnosis not present

## 2020-05-26 DIAGNOSIS — Z124 Encounter for screening for malignant neoplasm of cervix: Secondary | ICD-10-CM | POA: Diagnosis not present

## 2020-05-26 DIAGNOSIS — Z6822 Body mass index (BMI) 22.0-22.9, adult: Secondary | ICD-10-CM | POA: Diagnosis not present

## 2020-05-26 DIAGNOSIS — Z779 Other contact with and (suspected) exposures hazardous to health: Secondary | ICD-10-CM | POA: Diagnosis not present

## 2020-07-02 DIAGNOSIS — Z23 Encounter for immunization: Secondary | ICD-10-CM | POA: Diagnosis not present

## 2020-07-23 DIAGNOSIS — H353121 Nonexudative age-related macular degeneration, left eye, early dry stage: Secondary | ICD-10-CM | POA: Diagnosis not present

## 2020-07-23 DIAGNOSIS — H43391 Other vitreous opacities, right eye: Secondary | ICD-10-CM | POA: Diagnosis not present

## 2020-07-23 DIAGNOSIS — Z961 Presence of intraocular lens: Secondary | ICD-10-CM | POA: Diagnosis not present

## 2020-07-23 DIAGNOSIS — H26491 Other secondary cataract, right eye: Secondary | ICD-10-CM | POA: Diagnosis not present

## 2020-07-24 ENCOUNTER — Telehealth: Payer: Self-pay

## 2020-07-24 NOTE — Telephone Encounter (Signed)
Received a call from patient's daughter, [name] regarding patient medications. Per daughter, patient discontinued statins prescribed by you on her own due to swelling. Patient PCP prescribed Atorvastatin 10mg  once weekly. Patient is currently taking this and states blood pressures are good and shakiness has improved. Patient also decreased Amlodipine from 10mg  to 5mg  on her own. Daughter is concerned these changes are not beneficial to pt. Please advise. Thanks!

## 2020-07-25 NOTE — Telephone Encounter (Signed)
If they change it, I am not sure how I can help. What exactly do they want from me. Pleaase clarify

## 2020-07-25 NOTE — Telephone Encounter (Signed)
Daughter wanted to inform you that patient is changing medications on her own and would like to know if you approve of pt taking Atorvastatin 10mg  weekly and Amlodipine 5mg  daily. Per patient symptoms have improved.

## 2020-07-27 NOTE — Telephone Encounter (Signed)
Sure

## 2020-07-31 ENCOUNTER — Other Ambulatory Visit: Payer: Self-pay | Admitting: Cardiology

## 2020-07-31 DIAGNOSIS — I10 Essential (primary) hypertension: Secondary | ICD-10-CM

## 2020-08-11 ENCOUNTER — Telehealth: Payer: Self-pay

## 2020-08-11 NOTE — Telephone Encounter (Signed)
done

## 2020-08-21 ENCOUNTER — Other Ambulatory Visit (INDEPENDENT_AMBULATORY_CARE_PROVIDER_SITE_OTHER): Payer: PPO

## 2020-08-21 ENCOUNTER — Ambulatory Visit: Payer: PPO | Admitting: Internal Medicine

## 2020-08-21 ENCOUNTER — Encounter: Payer: Self-pay | Admitting: Internal Medicine

## 2020-08-21 VITALS — BP 100/60 | HR 94 | Ht 66.0 in | Wt 134.0 lb

## 2020-08-21 DIAGNOSIS — R630 Anorexia: Secondary | ICD-10-CM

## 2020-08-21 DIAGNOSIS — R634 Abnormal weight loss: Secondary | ICD-10-CM | POA: Diagnosis not present

## 2020-08-21 DIAGNOSIS — R197 Diarrhea, unspecified: Secondary | ICD-10-CM

## 2020-08-21 DIAGNOSIS — R3915 Urgency of urination: Secondary | ICD-10-CM | POA: Diagnosis not present

## 2020-08-21 LAB — COMPREHENSIVE METABOLIC PANEL
ALT: 10 U/L (ref 0–35)
AST: 12 U/L (ref 0–37)
Albumin: 4.3 g/dL (ref 3.5–5.2)
Alkaline Phosphatase: 64 U/L (ref 39–117)
BUN: 25 mg/dL — ABNORMAL HIGH (ref 6–23)
CO2: 28 mEq/L (ref 19–32)
Calcium: 9.4 mg/dL (ref 8.4–10.5)
Chloride: 99 mEq/L (ref 96–112)
Creatinine, Ser: 1.1 mg/dL (ref 0.40–1.20)
GFR: 46.8 mL/min — ABNORMAL LOW (ref >60.00)
Glucose, Bld: 86 mg/dL (ref 70–99)
Potassium: 4.5 mEq/L (ref 3.5–5.1)
Sodium: 133 mEq/L — ABNORMAL LOW (ref 135–145)
Total Bilirubin: 0.6 mg/dL (ref 0.2–1.2)
Total Protein: 7.7 g/dL (ref 6.0–8.3)

## 2020-08-21 LAB — CBC WITH DIFFERENTIAL/PLATELET
Basophils Absolute: 0.1 10*3/uL (ref 0.0–0.1)
Basophils Relative: 1.3 % (ref 0.0–3.0)
Eosinophils Absolute: 0.1 10*3/uL (ref 0.0–0.7)
Eosinophils Relative: 1.3 % (ref 0.0–5.0)
HCT: 41.7 % (ref 36.0–46.0)
Hemoglobin: 13.9 g/dL (ref 12.0–15.0)
Lymphocytes Relative: 23 % (ref 12.0–46.0)
Lymphs Abs: 1.2 10*3/uL (ref 0.7–4.0)
MCHC: 33.4 g/dL (ref 30.0–36.0)
MCV: 93.7 fl (ref 78.0–100.0)
Monocytes Absolute: 0.5 10*3/uL (ref 0.1–1.0)
Monocytes Relative: 9.9 % (ref 3.0–12.0)
Neutro Abs: 3.4 10*3/uL (ref 1.4–7.7)
Neutrophils Relative %: 64.5 % (ref 43.0–77.0)
Platelets: 285 10*3/uL (ref 150.0–400.0)
RBC: 4.45 Mil/uL (ref 3.87–5.11)
RDW: 13.6 % (ref 11.5–15.5)
WBC: 5.2 10*3/uL (ref 4.0–10.5)

## 2020-08-21 LAB — TSH: TSH: 0.17 u[IU]/mL — ABNORMAL LOW (ref 0.35–4.50)

## 2020-08-21 MED ORDER — NA SULFATE-K SULFATE-MG SULF 17.5-3.13-1.6 GM/177ML PO SOLN: 1.0000 | Freq: Once | ORAL | 0 refills | Status: AC
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
## 2020-08-21 NOTE — Patient Instructions (Signed)
Your provider has requested that you go to the basement level for lab work before leaving today. Press "B" on the elevator. The lab is located at the first door on the left as you exit the elevator.  Due to recent changes in healthcare laws, you may see the results of your imaging and laboratory studies on MyChart before your provider has had a chance to review them.  We understand that in some cases there may be results that are confusing or concerning to you. Not all laboratory results come back in the same time frame and the provider may be waiting for multiple results in order to interpret others.  Please give [place] 48 hours in order for your provider to thoroughly review all the results before contacting the office for clarification of your results.   You have been scheduled for an endoscopy and colonoscopy. Please follow the written instructions given to you at your visit today. Please pick up your prep supplies at the pharmacy within the next 1-3 days. If you use inhalers (even only as needed), please bring them with you on the day of your procedure.  I appreciate the opportunity to care for you. Silvano Rusk, MD, [facility]

## 2020-08-22 ENCOUNTER — Telehealth: Payer: Self-pay | Admitting: Internal Medicine

## 2020-08-22 DIAGNOSIS — R3915 Urgency of urination: Secondary | ICD-10-CM

## 2020-08-22 DIAGNOSIS — K5900 Constipation, unspecified: Secondary | ICD-10-CM

## 2020-08-22 LAB — IGA: Immunoglobulin A: 364 mg/dL — ABNORMAL HIGH (ref 70–320)

## 2020-08-22 LAB — TISSUE TRANSGLUTAMINASE, IGA: (tTG) Ab, IgA: <1 U/mL

## 2020-08-22 NOTE — Telephone Encounter (Signed)
Tara Bridges said she missed a call from Dr Carlean Purl yesterday and she thought he said on the message that she doesn't need the colonoscopy. Please advise Sir?  She is set up for a double on 08/26/2020. Thank you.

## 2020-08-22 NOTE — Telephone Encounter (Signed)
Mrs. Schreckengost informed of the plan. She will call Dr Baldwin Crown office. She will await PT to contact her about an appointment and a Dr Carlean Purl appointment was set up for late January. Her ECL has been cancelled and her labs have been faxed to Dr Forde Dandy for review. She said thank you.

## 2020-08-22 NOTE — Telephone Encounter (Signed)
Pt is requesting a call back from a nurse to discuss if she needs to cancel her EGD and Colonoscopy she has scheduled 12/21.

## 2020-08-22 NOTE — Telephone Encounter (Signed)
Correct  She is on too much thyroid medication as I had wondered  We will send labs to Dr. Forde Dandy (please do) and she should seek advice from him about changing thyroid med dose  I think that can explain why she has had diarrhea and weight loss.  So cancel procedures  Tell her I want to refer to pelvic PT re: constipation and urinary urgency  See me in 4-6 weeks   If she feels like she is deteriorating rather than improving before then she should message or call back.

## 2020-08-26 ENCOUNTER — Encounter: Payer: PPO | Admitting: Internal Medicine

## 2020-09-02 DIAGNOSIS — Z85828 Personal history of other malignant neoplasm of skin: Secondary | ICD-10-CM | POA: Diagnosis not present

## 2020-09-02 DIAGNOSIS — L821 Other seborrheic keratosis: Secondary | ICD-10-CM | POA: Diagnosis not present

## 2020-09-02 DIAGNOSIS — D1801 Hemangioma of skin and subcutaneous tissue: Secondary | ICD-10-CM | POA: Diagnosis not present

## 2020-09-04 DIAGNOSIS — Z23 Encounter for immunization: Secondary | ICD-10-CM | POA: Diagnosis not present

## 2020-09-22 DIAGNOSIS — I2510 Atherosclerotic heart disease of native coronary artery without angina pectoris: Secondary | ICD-10-CM | POA: Diagnosis not present

## 2020-09-22 DIAGNOSIS — I6523 Occlusion and stenosis of bilateral carotid arteries: Secondary | ICD-10-CM | POA: Diagnosis not present

## 2020-09-22 DIAGNOSIS — I5189 Other ill-defined heart diseases: Secondary | ICD-10-CM | POA: Diagnosis not present

## 2020-09-22 DIAGNOSIS — I679 Cerebrovascular disease, unspecified: Secondary | ICD-10-CM | POA: Diagnosis not present

## 2020-09-22 DIAGNOSIS — E039 Hypothyroidism, unspecified: Secondary | ICD-10-CM | POA: Diagnosis not present

## 2020-09-22 DIAGNOSIS — K5730 Diverticulosis of large intestine without perforation or abscess without bleeding: Secondary | ICD-10-CM | POA: Diagnosis not present

## 2020-09-22 DIAGNOSIS — I129 Hypertensive chronic kidney disease with stage 1 through stage 4 chronic kidney disease, or unspecified chronic kidney disease: Secondary | ICD-10-CM | POA: Diagnosis not present

## 2020-09-22 DIAGNOSIS — E785 Hyperlipidemia, unspecified: Secondary | ICD-10-CM | POA: Diagnosis not present

## 2020-09-22 DIAGNOSIS — N1831 Chronic kidney disease, stage 3a: Secondary | ICD-10-CM | POA: Diagnosis not present

## 2020-09-22 DIAGNOSIS — R634 Abnormal weight loss: Secondary | ICD-10-CM | POA: Diagnosis not present

## 2020-09-22 DIAGNOSIS — I10 Essential (primary) hypertension: Secondary | ICD-10-CM | POA: Diagnosis not present

## 2020-09-22 DIAGNOSIS — M8580 Other specified disorders of bone density and structure, unspecified site: Secondary | ICD-10-CM | POA: Diagnosis not present

## 2020-10-01 DIAGNOSIS — Z1231 Encounter for screening mammogram for malignant neoplasm of breast: Secondary | ICD-10-CM | POA: Diagnosis not present

## 2020-10-02 DIAGNOSIS — R634 Abnormal weight loss: Secondary | ICD-10-CM | POA: Diagnosis not present

## 2020-10-02 DIAGNOSIS — E039 Hypothyroidism, unspecified: Secondary | ICD-10-CM | POA: Diagnosis not present

## 2020-10-03 ENCOUNTER — Encounter: Payer: Self-pay | Admitting: Internal Medicine

## 2020-10-03 ENCOUNTER — Other Ambulatory Visit: Payer: Self-pay

## 2020-10-03 ENCOUNTER — Ambulatory Visit: Payer: PPO | Admitting: Internal Medicine

## 2020-10-03 VITALS — Ht 66.5 in | Wt 136.0 lb

## 2020-10-03 DIAGNOSIS — K5900 Constipation, unspecified: Secondary | ICD-10-CM | POA: Diagnosis not present

## 2020-10-03 DIAGNOSIS — R159 Full incontinence of feces: Secondary | ICD-10-CM | POA: Diagnosis not present

## 2020-10-03 DIAGNOSIS — R634 Abnormal weight loss: Secondary | ICD-10-CM | POA: Diagnosis not present

## 2020-10-03 DIAGNOSIS — N8110 Cystocele, unspecified: Secondary | ICD-10-CM

## 2020-10-03 DIAGNOSIS — R3915 Urgency of urination: Secondary | ICD-10-CM
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
## 2020-10-03 NOTE — Patient Instructions (Signed)
You have been scheduled for an endoscopy and colonoscopy. Please follow the written instructions given to you at your visit today. Please pick up your prep supplies at the pharmacy within the next 1-3 days. If you use inhalers (even only as needed), please bring them with you on the day of your procedure.   Due to recent changes in healthcare laws, you may see the results of your imaging and laboratory studies on MyChart before your provider has had a chance to review them.  We understand that in some cases there may be results that are confusing or concerning to you. Not all laboratory results come back in the same time frame and the provider may be waiting for multiple results in order to interpret others.  Please give us 48 hours in order for your provider to thoroughly review all the results before contacting the office for clarification of your results.    I appreciate the opportunity to care for you. Carl Gessner, MD, FACG 

## 2020-10-07 HISTORY — PX: UPPER GI ENDOSCOPY: SHX6162

## 2020-10-07 HISTORY — PX: OTHER SURGICAL HISTORY: SHX169

## 2020-10-23 ENCOUNTER — Telehealth: Payer: Self-pay | Admitting: Gastroenterology

## 2020-10-23 ENCOUNTER — Other Ambulatory Visit: Payer: Self-pay

## 2020-10-23 ENCOUNTER — Ambulatory Visit (AMBULATORY_SURGERY_CENTER): Payer: PPO | Admitting: Internal Medicine

## 2020-10-23 ENCOUNTER — Encounter: Payer: Self-pay | Admitting: Internal Medicine

## 2020-10-23 VITALS — BP 115/90 | HR 67 | Temp 96.8°F | Resp 22 | Ht 66.5 in | Wt 136.0 lb

## 2020-10-23 DIAGNOSIS — R194 Change in bowel habit: Secondary | ICD-10-CM | POA: Diagnosis not present

## 2020-10-23 DIAGNOSIS — K2960 Other gastritis without bleeding: Secondary | ICD-10-CM

## 2020-10-23 DIAGNOSIS — R630 Anorexia: Secondary | ICD-10-CM

## 2020-10-23 DIAGNOSIS — K5900 Constipation, unspecified: Secondary | ICD-10-CM

## 2020-10-23 DIAGNOSIS — R634 Abnormal weight loss: Secondary | ICD-10-CM | POA: Diagnosis not present

## 2020-10-23 DIAGNOSIS — K5730 Diverticulosis of large intestine without perforation or abscess without bleeding: Secondary | ICD-10-CM | POA: Diagnosis not present

## 2020-10-23 DIAGNOSIS — K319 Disease of stomach and duodenum, unspecified: Secondary | ICD-10-CM

## 2020-10-23 DIAGNOSIS — S36039A Unspecified laceration of spleen, initial encounter: Secondary | ICD-10-CM

## 2020-10-23 DIAGNOSIS — I10 Essential (primary) hypertension: Secondary | ICD-10-CM | POA: Diagnosis not present

## 2020-10-23 DIAGNOSIS — D125 Benign neoplasm of sigmoid colon: Secondary | ICD-10-CM

## 2020-10-23 DIAGNOSIS — K635 Polyp of colon: Secondary | ICD-10-CM | POA: Diagnosis not present

## 2020-10-23 DIAGNOSIS — K3189 Other diseases of stomach and duodenum: Secondary | ICD-10-CM | POA: Diagnosis not present

## 2020-10-23 HISTORY — DX: Unspecified laceration of spleen, initial encounter: S36.039A

## 2020-10-23 MED ORDER — SODIUM CHLORIDE 0.9 % IV SOLN: 500.0000 mL | Freq: Once | INTRAVENOUS | Status: DC

## 2020-10-23 NOTE — Op Note (Signed)
[facility] Patient Name: Tara Bridges Procedure Date: 10/23/2020 3:41 PM MRN: [id number] Endoscopist: Gatha Mayer , MD Age: 85 Referring MD:  Date of Birth: 10-15-1935 Gender: Female Account #: [id number] Procedure:                Upper GI endoscopy Indications:              Anorexia, Weight loss Medicines:                Propofol per Anesthesia, Monitored Anesthesia Care Procedure:                After obtaining informed consent, the endoscope was                            passed under direct vision. Throughout the                            procedure, the patient's blood pressure, pulse, and                            oxygen saturations were monitored continuously. The                            Endoscope was introduced through the mouth, and                            advanced to the second part of duodenum. The upper                            GI endoscopy was accomplished without difficulty.                            The patient tolerated the procedure well. Scope In: Scope Out: Findings:                 A few dispersed diminutive erosions with no                            stigmata of recent bleeding were found in the                            prepyloric region of the stomach. Biopsies were                            taken with a cold forceps for histology.                            Verification of patient identification for the                            specimen was done. Estimated blood loss was minimal.                           The exam was otherwise without abnormality.  The cardia and gastric fundus were normal on                            retroflexion. Complications:            No immediate complications. Estimated Blood Loss:     Estimated blood loss was minimal. Impression:               - Erosive gastropathy with no stigmata of recent                            bleeding. Biopsied.                           - The  examination was otherwise normal. Except                            irregular z-line Recommendation:           - Patient has a contact number available for                            emergencies. The signs and symptoms of potential                            delayed complications were discussed with the                            patient. Return to normal activities tomorrow.                            Written discharge instructions were provided to the                            patient.                           - Resume previous diet.                           - Continue present medications.                           - Await pathology results.                           - See the other procedure note for documentation of                            additional recommendations. Gatha Mayer, MD 10/23/2020 4:34:49 PM This report has been signed electronically.

## 2020-10-23 NOTE — Progress Notes (Signed)
Report to PACU, RN, vss, BBS= Clear.  

## 2020-10-23 NOTE — Telephone Encounter (Signed)
Called by patient and her daughter.  Patient underwent upper and lower endoscopy this afternoon.  Uneventful procedures documented.  Although colonoscopy noted to have some difficulty as a result of looping requiring abdominal pressure.  The patient and daughter state that after her procedure she was experiencing significant abdominal discomfort that improved after passage of some gas.  However she was also experiencing some shoulder pain on her right side.  This is progressed over the course of the last few hours.  The discomfort is noted to be in the region of the shoulder blade rather than in the joint.  She describes no left joint pain.  Heat has been applied with some improvement.  Massaging the area also has led to some improvement however when that is stopped and the heat pack is removed the discomfort persists.  She describes no pleuritic chest discomfort or shortness of breath or dyspnea on exertion.  Abdominal discomfort persists although is continuing to improve somewhat.  She does not feel significant abdominal distention at this time.  She has an appetite.  Although I think it is unlikely that there is a complication from the procedure we need to be very mindful and monitor this closely.  I have asked the patient to go ahead and take Tylenol 500 mg or 650 mg or 1000 mg once.  She has already taken meloxicam within the last 30 minutes.  If the patient continues to have discomfort or it persists and worsens then they will call me back to alert me.  Certainly if she develops any shortness of breath or chest pain they will call and alert me.  They understand that if issues persist the next steps would likely be for evaluation in the emergency department for concern of potential complications.  Again, I think this is less likely but cannot be definitively ruled out if things worsen or persist.  They were appreciative for the call back and information this evening.  Sheri if you can reach out to the  patient tomorrow and update Dr. Carlean Purl that would be helpful, if you do not see any follow-up messages from me tonight (Thursday night).  FYI Dr. Carlean Purl

## 2020-10-23 NOTE — Progress Notes (Signed)
Vitals-SH  Pt's states no medical or surgical changes since previsit or office visit. 

## 2020-10-23 NOTE — Patient Instructions (Signed)
Handouts on polyps, diverticulosis, and gastritis given to you today  Await pathology results from Dr. Carlean Purl    YOU HAD AN ENDOSCOPIC PROCEDURE TODAY AT THE [facility]:   Refer to the procedure report that was given to you for any specific questions about what was found during the examination.  If the procedure report does not answer your questions, please call your gastroenterologist to clarify.  If you requested that your care partner not be given the details of your procedure findings, then the procedure report has been included in a sealed envelope for you to review at your convenience later.  YOU SHOULD EXPECT: Some feelings of bloating in the abdomen. Passage of more gas than usual.  Walking can help get rid of the air that was put into your GI tract during the procedure and reduce the bloating. If you had a lower endoscopy (such as a colonoscopy or flexible sigmoidoscopy) you may notice spotting of blood in your stool or on the toilet paper. If you underwent a bowel prep for your procedure, you may not have a normal bowel movement for a few days.  Please Note:  You might notice some irritation and congestion in your nose or some drainage.  This is from the oxygen used during your procedure.  There is no need for concern and it should clear up in a day or so.  SYMPTOMS TO REPORT IMMEDIATELY:   Following lower endoscopy (colonoscopy or flexible sigmoidoscopy):  Excessive amounts of blood in the stool  Significant tenderness or worsening of abdominal pains  Swelling of the abdomen that is new, acute  Fever of 100F or higher   Following upper endoscopy (EGD)  Vomiting of blood or coffee ground material  New chest pain or pain under the shoulder blades  Painful or persistently difficult swallowing  New shortness of breath  Fever of 100F or higher  Black, tarry-looking stools  For urgent or emergent issues, a gastroenterologist can be reached at any hour by calling  [phone]. Do not use MyChart messaging for urgent concerns.    DIET:  We do recommend a small meal at first, but then you may proceed to your regular diet.  Drink plenty of fluids but you should avoid alcoholic beverages for 24 hours.  ACTIVITY:  You should plan to take it easy for the rest of today and you should NOT DRIVE or use heavy machinery until tomorrow (because of the sedation medicines used during the test).    FOLLOW UP: Our staff will call the number listed on your records 48-72 hours following your procedure to check on you and address any questions or concerns that you may have regarding the information given to you following your procedure. If we do not reach you, we will leave a message.  We will attempt to reach you two times.  During this call, we will ask if you have developed any symptoms of COVID 19. If you develop any symptoms (ie: fever, flu-like symptoms, shortness of breath, cough etc.) before then, please call [phone].  If you test positive for Covid 19 in the 2 weeks post procedure, please call and report this information to [place].    If any biopsies were taken you will be contacted by phone or by letter within the next 1-3 weeks.  Please call us at [phone] if you have not heard about the biopsies in 3 weeks.    SIGNATURES/CONFIDENTIALITY: You and/or your care partner have signed paperwork which will  be entered into your electronic medical record.  These signatures attest to the fact that that the information above on your After Visit Summary has been reviewed and is understood.  Full responsibility of the confidentiality of this discharge information lies with you and/or your care-partner. 

## 2020-10-23 NOTE — Op Note (Signed)
[facility] Patient Name: Tara Bridges Procedure Date: 10/23/2020 3:41 PM MRN: [id number] Endoscopist: Gatha Mayer , MD Age: 85 Referring MD:  Date of Birth: [birth year] Gender: Female Account #: [id number] Procedure:                Colonoscopy Indications:              Change in bowel habits, Fecal incontinence, Weight                            loss Procedure:                Pre-Anesthesia Assessment:                           - Prior to the procedure, a History and Physical                            was performed, and patient medications and                            allergies were reviewed. The patient's tolerance of                            previous anesthesia was also reviewed. The risks                            and benefits of the procedure and the sedation                            options and risks were discussed with the patient.                            All questions were answered, and informed consent                            was obtained. Prior Anticoagulants: The patient has                            taken no previous anticoagulant or antiplatelet                            agents. ASA Grade Assessment: II - A patient with                            mild systemic disease. After reviewing the risks                            and benefits, the patient was deemed in                            satisfactory condition to undergo the procedure.                           After obtaining informed consent, the colonoscope  was passed under direct vision. Throughout the                            procedure, the patient's blood pressure, pulse, and                            oxygen saturations were monitored continuously. The                            Olympus PFC-[id number] (#[id number]) Colonoscope was                            introduced through the anus and advanced to the the                            terminal ileum, with  identification of the                            appendiceal orifice and IC valve. The colonoscopy                            was somewhat difficult due to significant looping.                            Successful completion of the procedure was aided by                            applying abdominal pressure. The patient tolerated                            the procedure fairly well. The quality of the bowel                            preparation was excellent. The bowel preparation                            used was SUPREP via split dose instruction. The                            terminal ileum, ileocecal valve, appendiceal                            orifice, and rectum were photographed. Scope In: 3:54:31 PM Scope Out: 4:11:08 PM Scope Withdrawal Time: 0 hours 8 minutes 19 seconds  Total Procedure Duration: 0 hours 16 minutes 37 seconds  Findings:                 The perianal and digital rectal examinations were                            normal.                           A diminutive polyp was found in the sigmoid colon.  The polyp was sessile. The polyp was removed with a                            cold snare. Resection and retrieval were complete.                            Verification of patient identification for the                            specimen was done. Estimated blood loss was minimal.                           Multiple diverticula were found in the sigmoid                            colon.                           The terminal ileum appeared normal.                           The exam was otherwise without abnormality on                            direct and retroflexion views. Complications:            No immediate complications. Estimated Blood Loss:     Estimated blood loss was minimal. Impression:               - One diminutive polyp in the sigmoid colon,                            removed with a cold snare. Resected and retrieved.                            - Diverticulosis in the sigmoid colon.                           - The examined portion of the ileum was normal.                           - The examination was otherwise normal on direct                            and retroflexion views. Recommendation:           - Patient has a contact number available for                            emergencies. The signs and symptoms of potential                            delayed complications were discussed with the                            patient. Return  to normal activities tomorrow.                            Written discharge instructions were provided to the                            patient.                           - Resume previous diet.                           - Continue present medications.                           - Await pathology results.                           - No repeat colonoscopy due to age. Gatha Mayer, MD 10/23/2020 4:37:59 PM This report has been signed electronically.

## 2020-10-23 NOTE — Progress Notes (Signed)
Called to room to assist during endoscopic procedure.  Patient ID and intended procedure confirmed with present staff. Received instructions for my participation in the procedure from the performing physician.  

## 2020-10-24 ENCOUNTER — Encounter (HOSPITAL_COMMUNITY): Payer: Self-pay | Admitting: *Deleted

## 2020-10-24 ENCOUNTER — Inpatient Hospital Stay (HOSPITAL_COMMUNITY)
Admission: EM | Admit: 2020-10-24 | Discharge: 2020-10-27 | DRG: 907 | Disposition: A | Discharge status: Home / Self Care | Payer: PPO | Attending: Internal Medicine | Admitting: Internal Medicine

## 2020-10-24 ENCOUNTER — Emergency Department (HOSPITAL_COMMUNITY): Payer: PPO

## 2020-10-24 ENCOUNTER — Other Ambulatory Visit: Payer: Self-pay

## 2020-10-24 ENCOUNTER — Encounter: Payer: Self-pay | Admitting: Internal Medicine

## 2020-10-24 DIAGNOSIS — Z79899 Other long term (current) drug therapy: Secondary | ICD-10-CM | POA: Diagnosis not present

## 2020-10-24 DIAGNOSIS — J9811 Atelectasis: Secondary | ICD-10-CM | POA: Diagnosis not present

## 2020-10-24 DIAGNOSIS — K259 Gastric ulcer, unspecified as acute or chronic, without hemorrhage or perforation: Secondary | ICD-10-CM | POA: Diagnosis not present

## 2020-10-24 DIAGNOSIS — M8580 Other specified disorders of bone density and structure, unspecified site: Secondary | ICD-10-CM | POA: Diagnosis not present

## 2020-10-24 DIAGNOSIS — Z87891 Personal history of nicotine dependence: Secondary | ICD-10-CM | POA: Diagnosis not present

## 2020-10-24 DIAGNOSIS — Z880 Allergy status to penicillin: Secondary | ICD-10-CM | POA: Diagnosis not present

## 2020-10-24 DIAGNOSIS — S35299A Unspecified injury of branches of celiac and mesenteric artery, initial encounter: Secondary | ICD-10-CM | POA: Diagnosis present

## 2020-10-24 DIAGNOSIS — E039 Hypothyroidism, unspecified: Secondary | ICD-10-CM | POA: Diagnosis not present

## 2020-10-24 DIAGNOSIS — Z888 Allergy status to other drugs, medicaments and biological substances status: Secondary | ICD-10-CM

## 2020-10-24 DIAGNOSIS — I10 Essential (primary) hypertension: Secondary | ICD-10-CM | POA: Diagnosis not present

## 2020-10-24 DIAGNOSIS — S36031A Moderate laceration of spleen, initial encounter: Secondary | ICD-10-CM | POA: Diagnosis not present

## 2020-10-24 DIAGNOSIS — Z79890 Hormone replacement therapy (postmenopausal): Secondary | ICD-10-CM | POA: Diagnosis not present

## 2020-10-24 DIAGNOSIS — R571 Hypovolemic shock: Secondary | ICD-10-CM | POA: Diagnosis not present

## 2020-10-24 DIAGNOSIS — E559 Vitamin D deficiency, unspecified: Secondary | ICD-10-CM | POA: Diagnosis not present

## 2020-10-24 DIAGNOSIS — E785 Hyperlipidemia, unspecified: Secondary | ICD-10-CM | POA: Diagnosis present

## 2020-10-24 DIAGNOSIS — Y658 Other specified misadventures during surgical and medical care: Secondary | ICD-10-CM | POA: Diagnosis present

## 2020-10-24 DIAGNOSIS — R6881 Early satiety: Secondary | ICD-10-CM | POA: Diagnosis not present

## 2020-10-24 DIAGNOSIS — D7811 Accidental puncture and laceration of the spleen during a procedure on the spleen: Principal | ICD-10-CM | POA: Diagnosis present

## 2020-10-24 DIAGNOSIS — Z20822 Contact with and (suspected) exposure to covid-19: Secondary | ICD-10-CM | POA: Diagnosis present

## 2020-10-24 DIAGNOSIS — Z881 Allergy status to other antibiotic agents status: Secondary | ICD-10-CM | POA: Diagnosis not present

## 2020-10-24 DIAGNOSIS — Z85828 Personal history of other malignant neoplasm of skin: Secondary | ICD-10-CM | POA: Diagnosis not present

## 2020-10-24 DIAGNOSIS — R109 Unspecified abdominal pain: Secondary | ICD-10-CM | POA: Diagnosis not present

## 2020-10-24 DIAGNOSIS — R578 Other shock: Secondary | ICD-10-CM

## 2020-10-24 DIAGNOSIS — K5730 Diverticulosis of large intestine without perforation or abscess without bleeding: Secondary | ICD-10-CM | POA: Diagnosis not present

## 2020-10-24 DIAGNOSIS — D62 Acute posthemorrhagic anemia: Secondary | ICD-10-CM | POA: Diagnosis present

## 2020-10-24 DIAGNOSIS — I7300 Raynaud's syndrome without gangrene: Secondary | ICD-10-CM | POA: Diagnosis present

## 2020-10-24 DIAGNOSIS — R0602 Shortness of breath: Secondary | ICD-10-CM | POA: Diagnosis not present

## 2020-10-24 DIAGNOSIS — Z9889 Other specified postprocedural states: Secondary | ICD-10-CM | POA: Diagnosis not present

## 2020-10-24 DIAGNOSIS — I951 Orthostatic hypotension: Secondary | ICD-10-CM | POA: Diagnosis present

## 2020-10-24 DIAGNOSIS — M419 Scoliosis, unspecified: Secondary | ICD-10-CM | POA: Diagnosis present

## 2020-10-24 DIAGNOSIS — Z23 Encounter for immunization: Secondary | ICD-10-CM

## 2020-10-24 DIAGNOSIS — S36039A Unspecified laceration of spleen, initial encounter: Secondary | ICD-10-CM | POA: Diagnosis present

## 2020-10-24 DIAGNOSIS — K661 Hemoperitoneum: Secondary | ICD-10-CM | POA: Diagnosis present

## 2020-10-24 HISTORY — PX: IR AORTAGRAM ABDOMINAL SERIALOGRAM: IMG636

## 2020-10-24 HISTORY — PX: IR US GUIDE VASC ACCESS RIGHT: IMG2390

## 2020-10-24 HISTORY — PX: IR ANGIOGRAM VISCERAL SELECTIVE: IMG657

## 2020-10-24 HISTORY — PX: IR EMBO ART  VEN HEMORR LYMPH EXTRAV  INC GUIDE ROADMAPPING: IMG5450

## 2020-10-24 LAB — CBC WITH DIFFERENTIAL/PLATELET
Abs Immature Granulocytes: 0.07 10*3/uL (ref 0.00–0.07)
Basophils Absolute: 0.1 10*3/uL (ref 0.0–0.1)
Basophils Relative: 1 %
Eosinophils Absolute: 0.2 10*3/uL (ref 0.0–0.5)
Eosinophils Relative: 1 %
HCT: 37.7 % (ref 36.0–46.0)
Hemoglobin: 12.2 g/dL (ref 12.0–15.0)
Immature Granulocytes: 1 %
Lymphocytes Relative: 12 %
Lymphs Abs: 1.6 10*3/uL (ref 0.7–4.0)
MCH: 31.4 pg (ref 26.0–34.0)
MCHC: 32.4 g/dL (ref 30.0–36.0)
MCV: 96.9 fL (ref 80.0–100.0)
Monocytes Absolute: 1 10*3/uL (ref 0.1–1.0)
Monocytes Relative: 8 %
Neutro Abs: 10.2 10*3/uL — ABNORMAL HIGH (ref 1.7–7.7)
Neutrophils Relative %: 77 %
Platelets: 281 10*3/uL (ref 150–400)
RBC: 3.89 MIL/uL (ref 3.87–5.11)
RDW: 13.5 % (ref 11.5–15.5)
WBC: 13.2 10*3/uL — ABNORMAL HIGH (ref 4.0–10.5)
nRBC: 0 % (ref 0.0–0.2)

## 2020-10-24 LAB — URINALYSIS, ROUTINE W REFLEX MICROSCOPIC
Bacteria, UA: NONE SEEN
Bilirubin Urine: NEGATIVE
Glucose, UA: NEGATIVE mg/dL
Hgb urine dipstick: NEGATIVE
Ketones, ur: 20 mg/dL — AB
Nitrite: NEGATIVE
Protein, ur: NEGATIVE mg/dL
Specific Gravity, Urine: 1.01 (ref 1.005–1.030)
pH: 5 (ref 5.0–8.0)

## 2020-10-24 LAB — COMPREHENSIVE METABOLIC PANEL
ALT: 12 U/L (ref 0–44)
AST: 18 U/L (ref 15–41)
Albumin: 3.8 g/dL (ref 3.5–5.0)
Alkaline Phosphatase: 57 U/L (ref 38–126)
Anion gap: 12 (ref 5–15)
BUN: 17 mg/dL (ref 8–23)
CO2: 21 mmol/L — ABNORMAL LOW (ref 22–32)
Calcium: 9 mg/dL (ref 8.9–10.3)
Chloride: 98 mmol/L (ref 98–111)
Creatinine, Ser: 1.04 mg/dL — ABNORMAL HIGH (ref 0.44–1.00)
GFR, Estimated: 54 mL/min — ABNORMAL LOW (ref >60)
Glucose, Bld: 120 mg/dL — ABNORMAL HIGH (ref 70–99)
Potassium: 4 mmol/L (ref 3.5–5.1)
Sodium: 131 mmol/L — ABNORMAL LOW (ref 135–145)
Total Bilirubin: 0.9 mg/dL (ref 0.3–1.2)
Total Protein: 6.4 g/dL — ABNORMAL LOW (ref 6.5–8.1)

## 2020-10-24 LAB — PREPARE RBC (CROSSMATCH)

## 2020-10-24 LAB — GLUCOSE, CAPILLARY: Glucose-Capillary: 116 mg/dL — ABNORMAL HIGH (ref 70–99)

## 2020-10-24 LAB — RESP PANEL BY RT-PCR (FLU A&B, COVID) ARPGX2
Influenza A by PCR: NEGATIVE
Influenza B by PCR: NEGATIVE
SARS Coronavirus 2 by RT PCR: NEGATIVE

## 2020-10-24 LAB — LIPASE, BLOOD: Lipase: 27 U/L (ref 11–51)

## 2020-10-24 LAB — ABO/RH: ABO/RH(D): O POS

## 2020-10-24 LAB — LACTIC ACID, PLASMA: Lactic Acid, Venous: 1.4 mmol/L (ref 0.5–1.9)

## 2020-10-24 IMAGING — CT CT ANGIO CHEST
2 of 9 series · 17 of 46 positions shown · IV contrast (omnipaque)
Comparison: PA and lateral chest earlier today.

CLINICAL DATA: Onset shortness of breath and abdominal pain today.
Status post EGD and colonoscopy yesterday.

EXAM:
CT ANGIOGRAPHY CHEST WITH CONTRAST
TECHNIQUE: Multidetector CT imaging of the chest was performed using the
standard protocol during bolus administration of intravenous
contrast. Multiplanar CT image reconstructions and MIPs were
obtained to evaluate the vascular anatomy.
CONTRAST:  100 mL OMNIPAQUE IOHEXOL 350 MG/ML SOLN

[Series 6: thins · axial · 0.68mm/px · z∈[+1280,+1538]mm · 14 of 298 slices shown]
[im 20/298  lung]
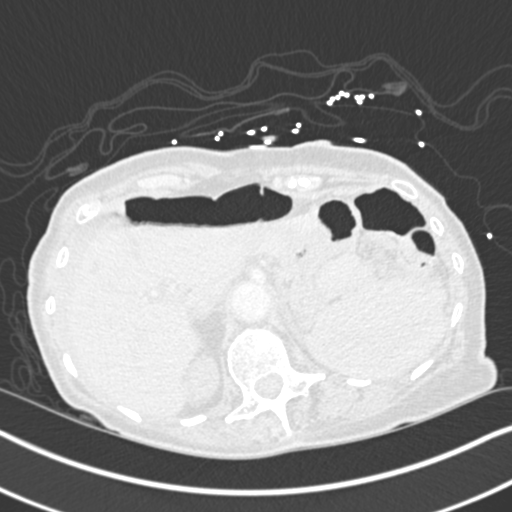
[im 40/298  soft-tissue]
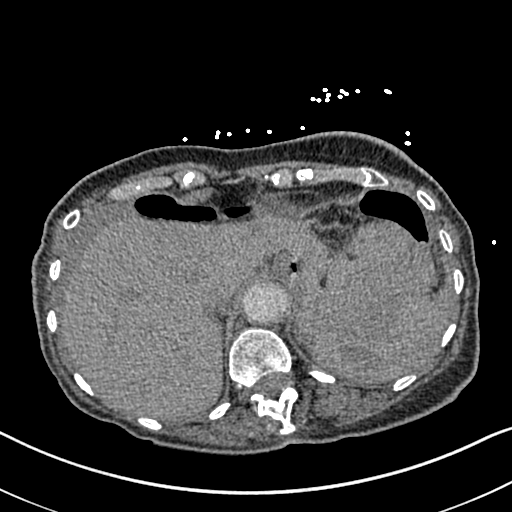
[im 60/298  lung]
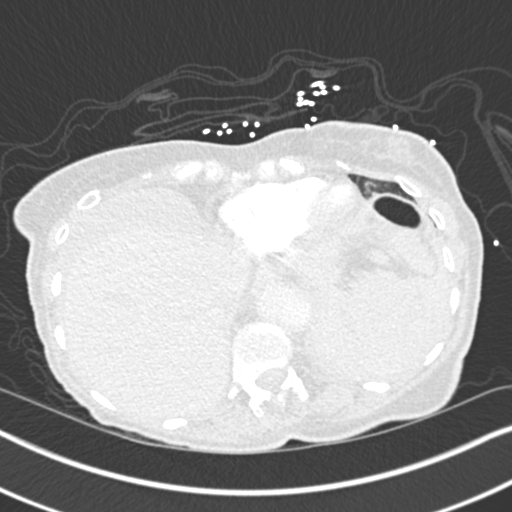
[im 80/298  soft-tissue]
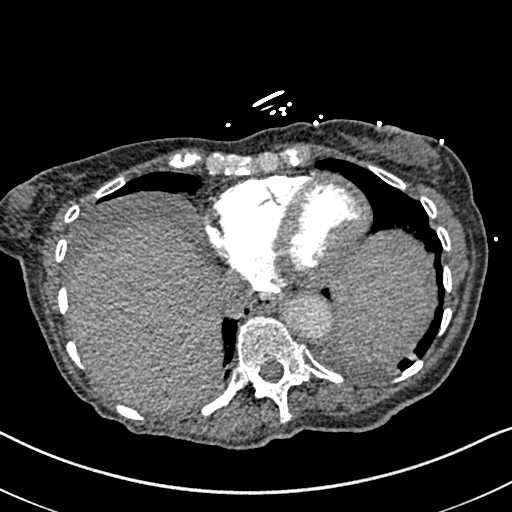
[im 100/298  lung]
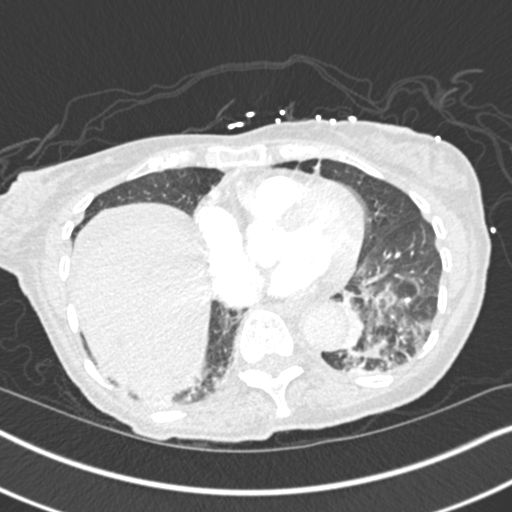
[im 119/298  soft-tissue]
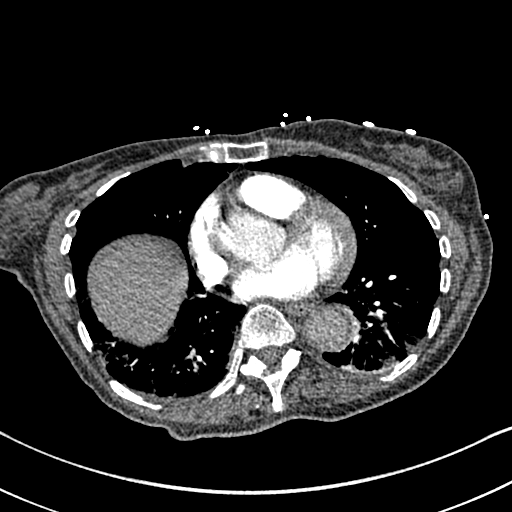
[im 139/298  lung]
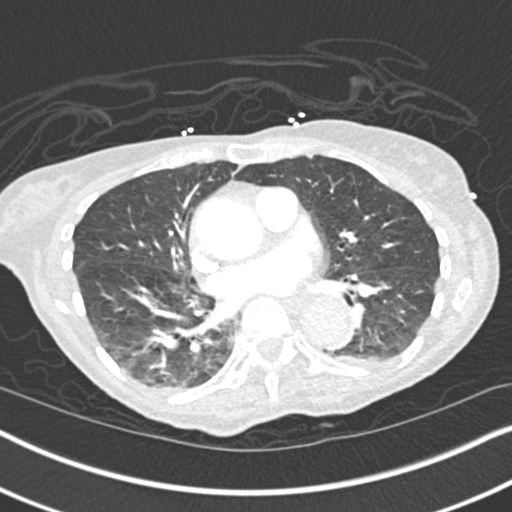
[im 159/298  soft-tissue]
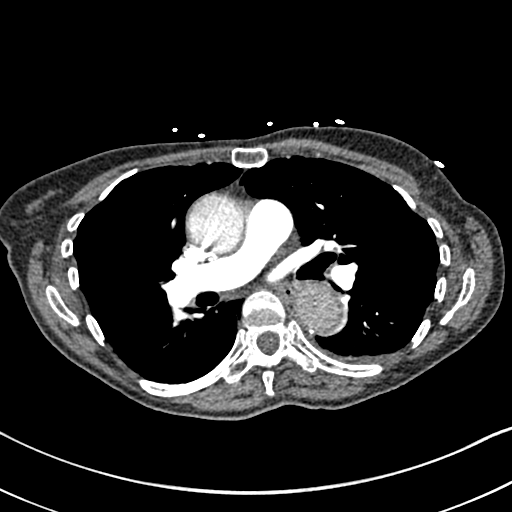
[im 179/298  lung]
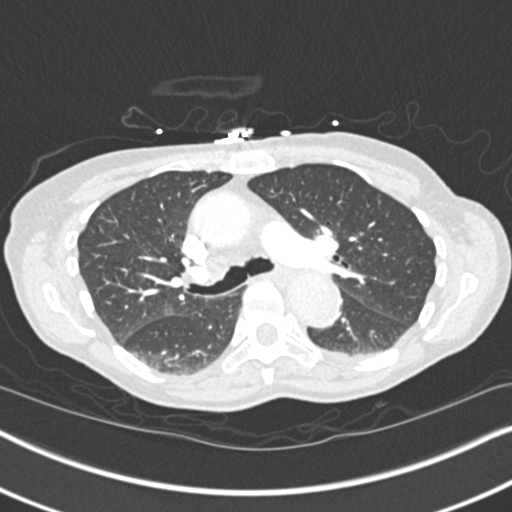
[im 199/298  soft-tissue]
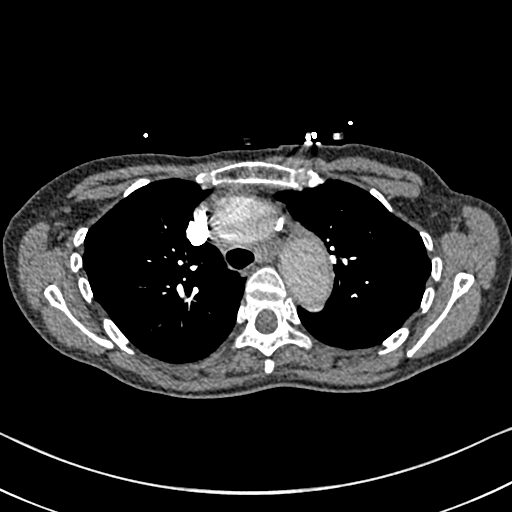
[im 218/298  lung]
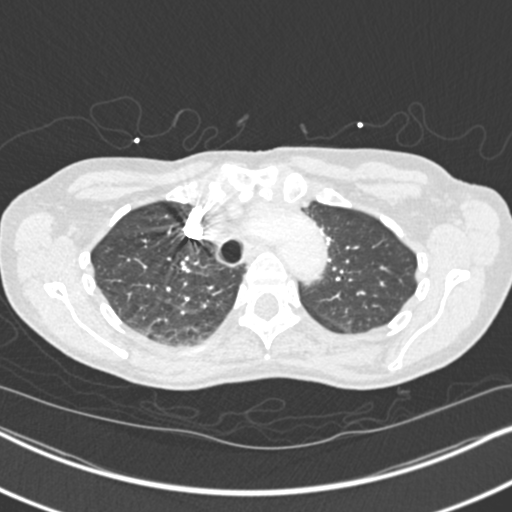
[im 238/298  soft-tissue]
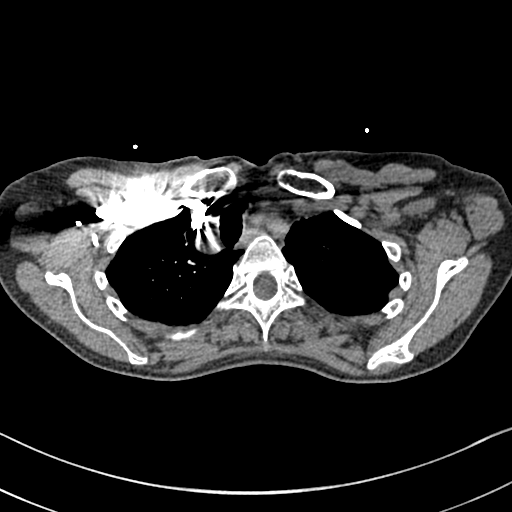
[im 258/298  lung]
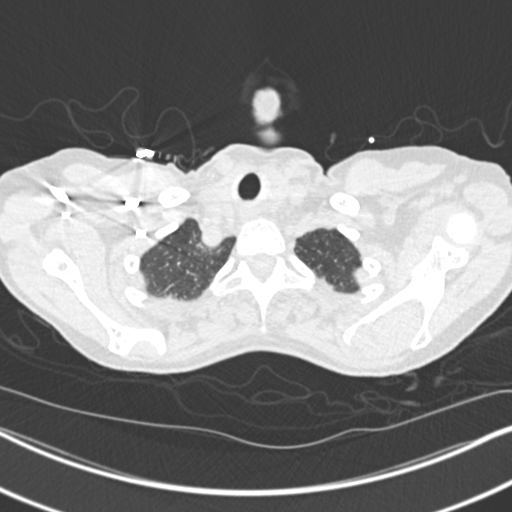
[im 278/298  soft-tissue]
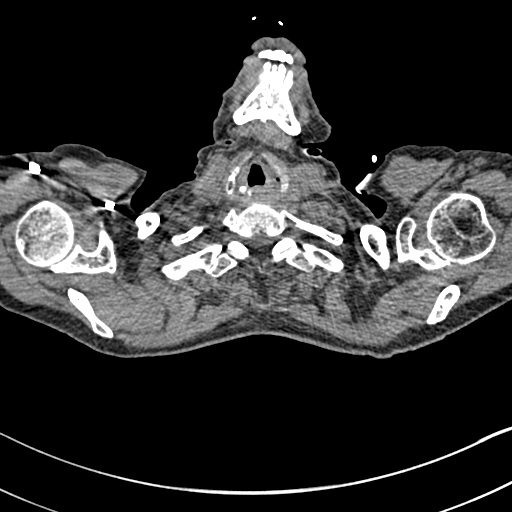

[Series 7: coronal mpr · coronal · 0.57mm/px · 3 of 123 slices shown]
[im 31/123  soft-tissue]
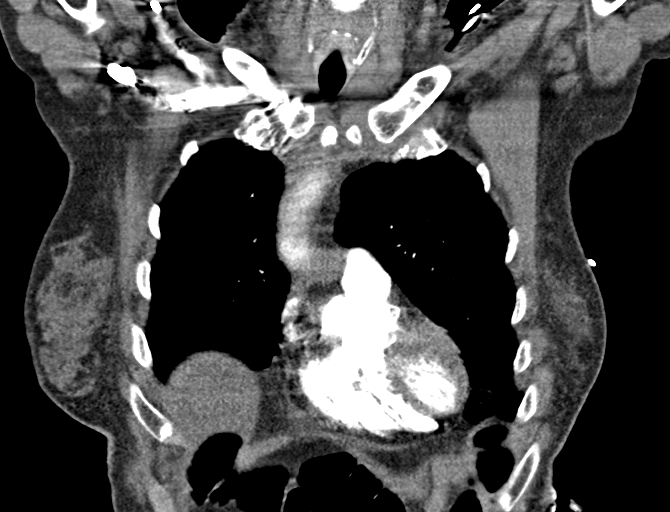
[im 62/123  soft-tissue]
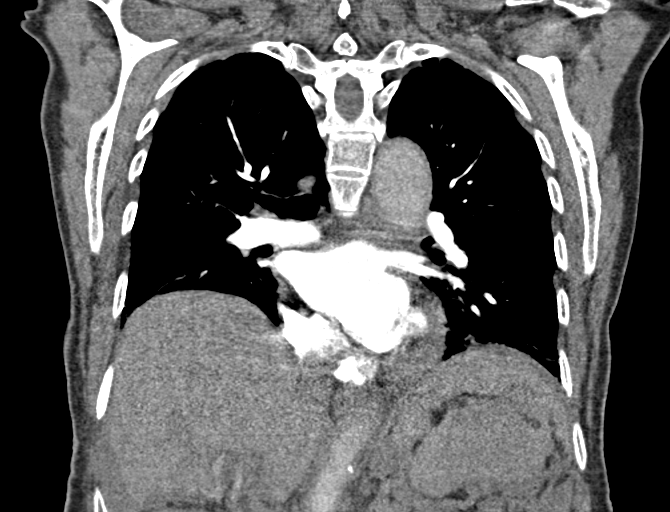
[im 92/123  soft-tissue]
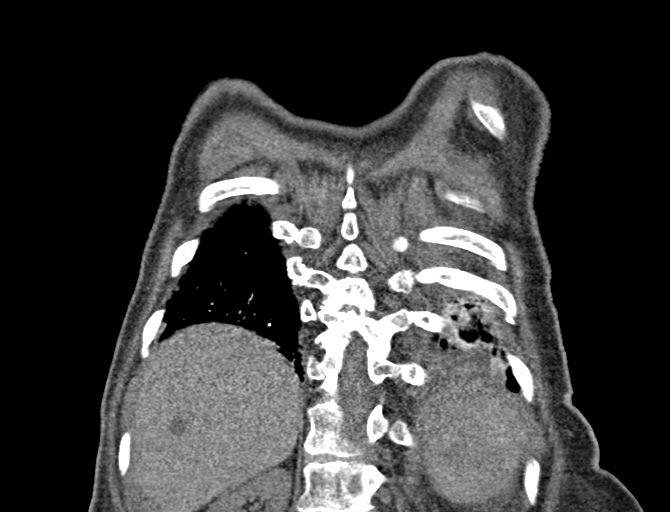

[17 of 46 positions shown; findings below may reference images not displayed]

FINDINGS: Cardiovascular: Satisfactory opacification of the pulmonary arteries
to the segmental level. No evidence of pulmonary embolism. Normal
heart size. No pericardial effusion.

Mediastinum/Nodes: No enlarged mediastinal, hilar, or axillary lymph
nodes. Thyroid gland, trachea, and esophagus demonstrate no
significant findings.

Lungs/Pleura: Lungs demonstrate mild dependent atelectasis. Trace
pleural effusions.

Upper Abdomen: See report of dedicated abdomen and pelvis CT this
same day.

Musculoskeletal: No acute abnormality.

Review of the MIP images confirms the above findings.
IMPRESSION: Negative for pulmonary embolus.

Trace bilateral pleural effusions and mild dependent atelectasis.

## 2020-10-24 IMAGING — CT CT ABD-PELV W/ CM
2 of 5 series · 16 of 46 positions shown, 18 images · IV contrast (omnipaque)
Comparison: None.

CLINICAL DATA: Onset shortness of breath and abdominal pain today.

EXAM:
CT ABDOMEN AND PELVIS WITH CONTRAST
TECHNIQUE: Multidetector CT imaging of the abdomen and pelvis was performed
using the standard protocol following bolus administration of
intravenous contrast.
CONTRAST:  100 mL OMNIPAQUE IOHEXOL 350 MG/ML SOLN

[Series 2: axial st · axial · 0.87mm/px · z∈[+971,+1381]mm · 13 of 96 slices shown, 15 images]
[im 7/96  soft-tissue]
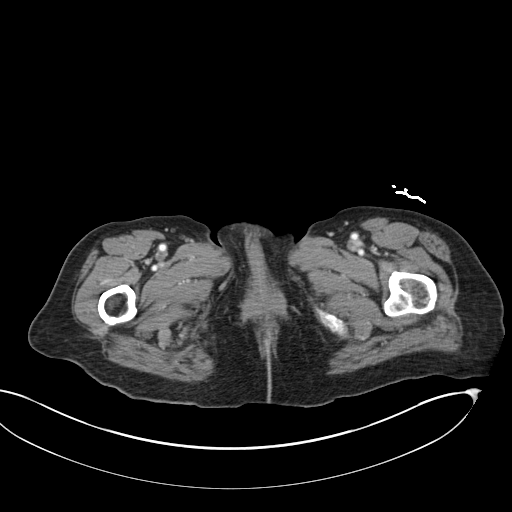
[im 7/96  bone]
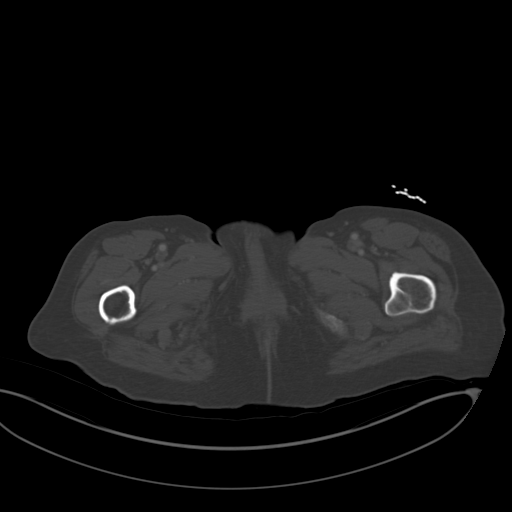
[im 14/96  soft-tissue]
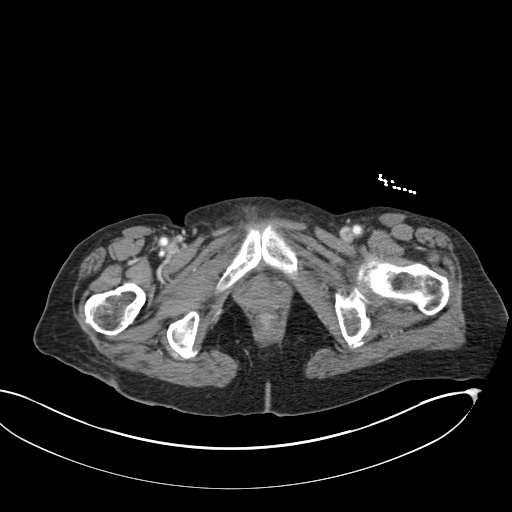
[im 21/96  soft-tissue]
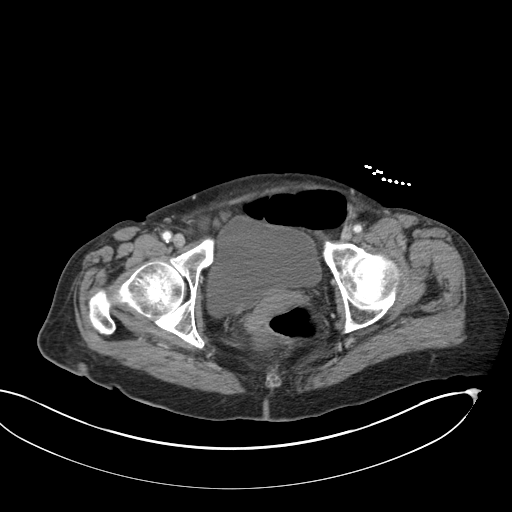
[im 28/96  soft-tissue]
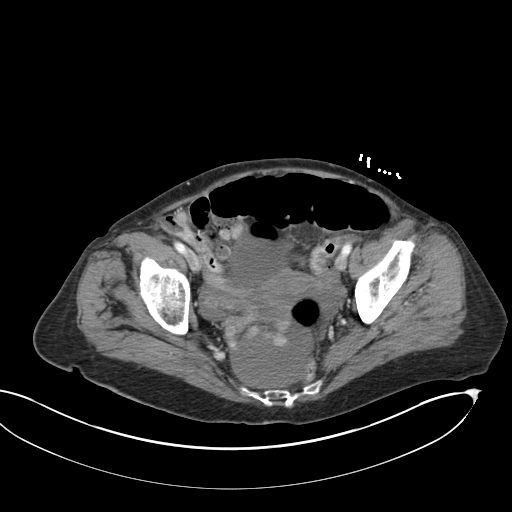
[im 34/96  soft-tissue]
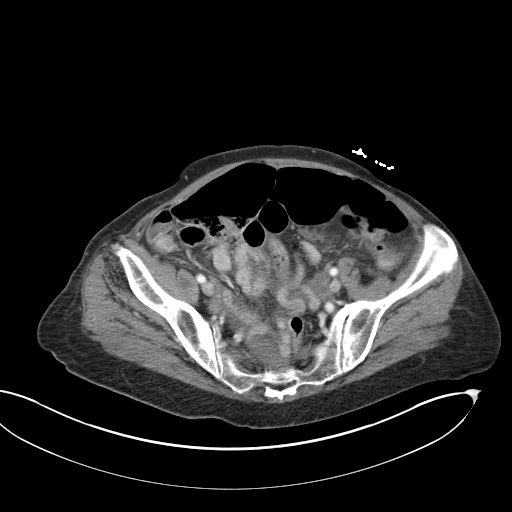
[im 41/96  soft-tissue]
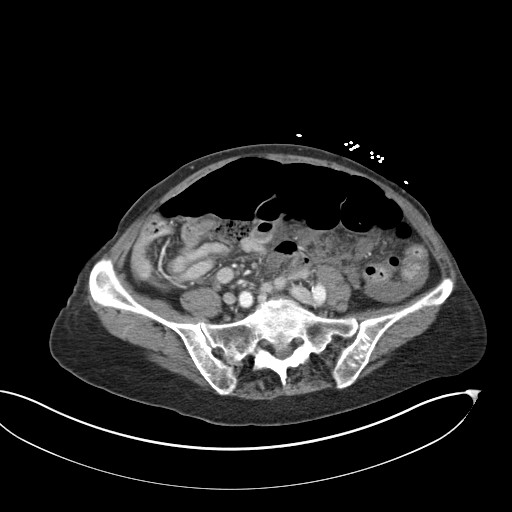
[im 48/96  soft-tissue]
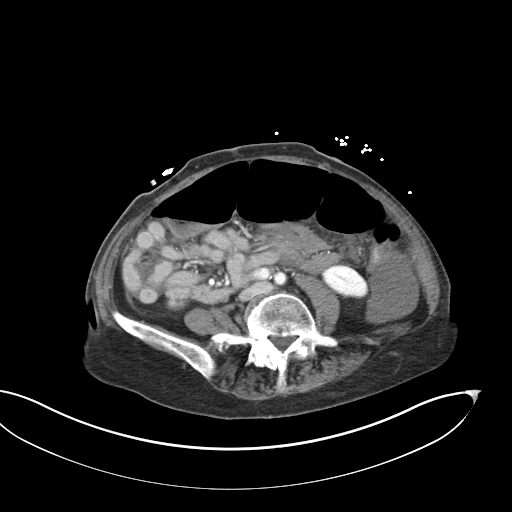
[im 55/96  soft-tissue]
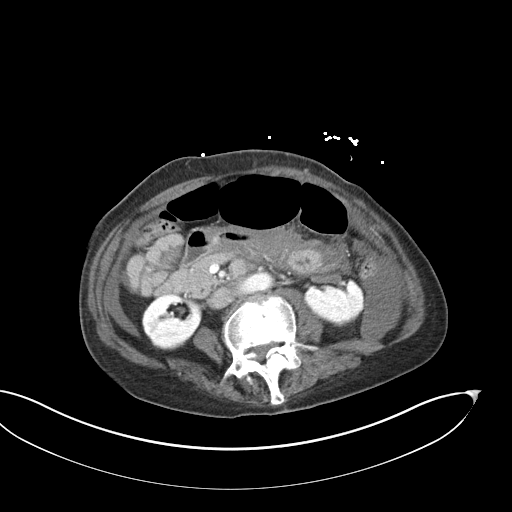
[im 62/96  soft-tissue]
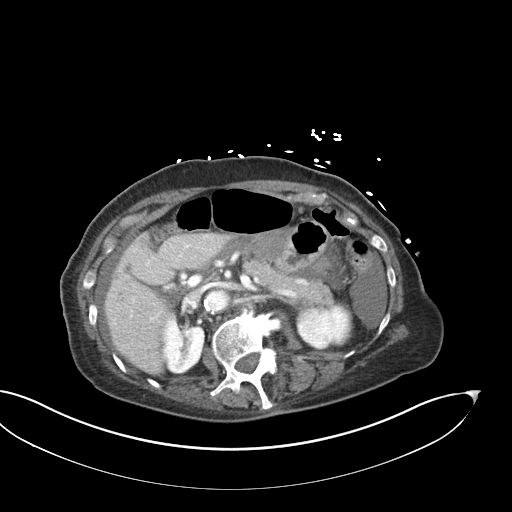
[im 62/96  bone]
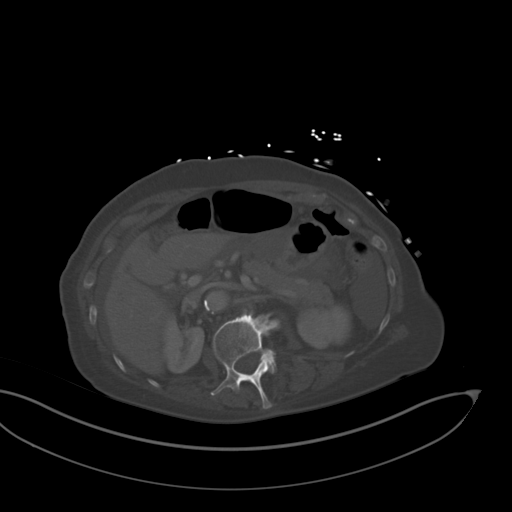
[im 68/96  soft-tissue]
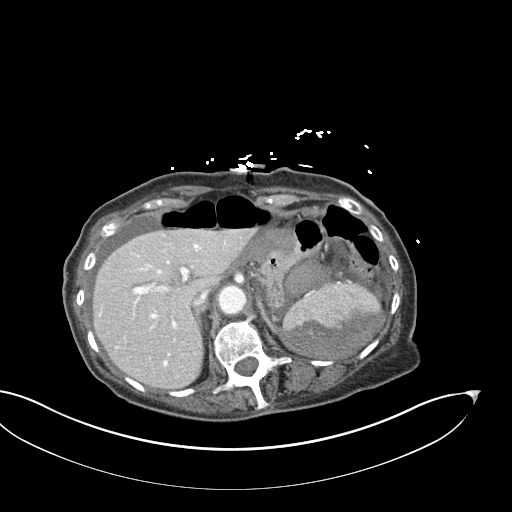
[im 75/96  soft-tissue]
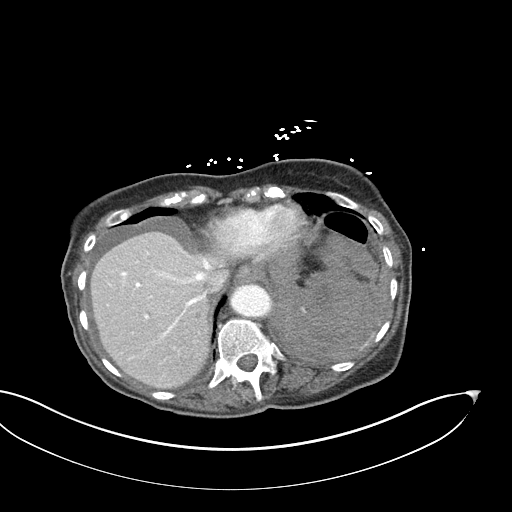
[im 82/96  soft-tissue]
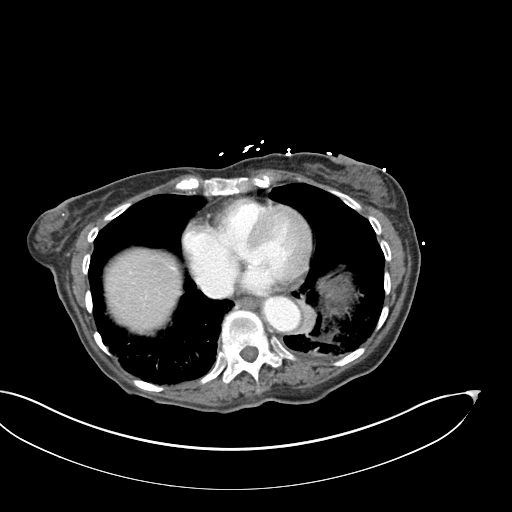
[im 89/96  soft-tissue]
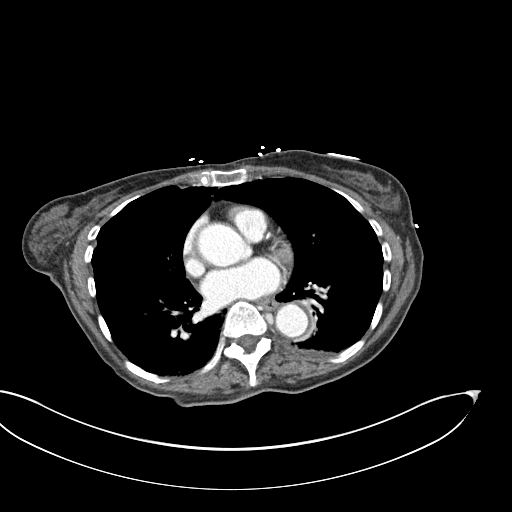

[Series 4: coronal st · coronal · 0.70mm/px · 3 of 86 slices shown]
[im 29/86  soft-tissue]
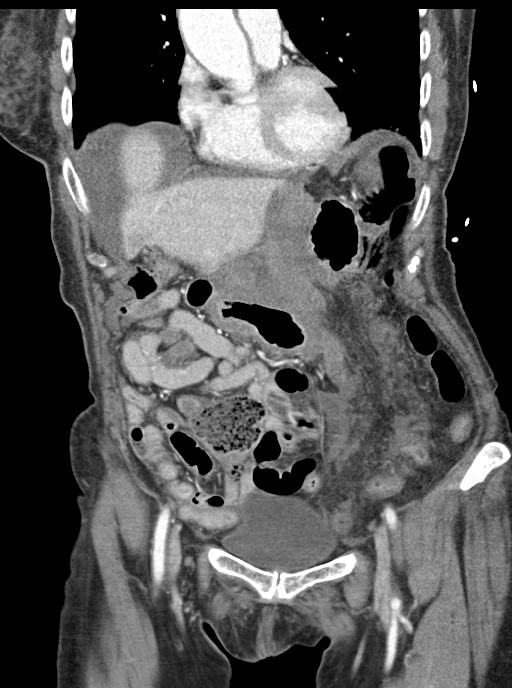
[im 38/86  soft-tissue]
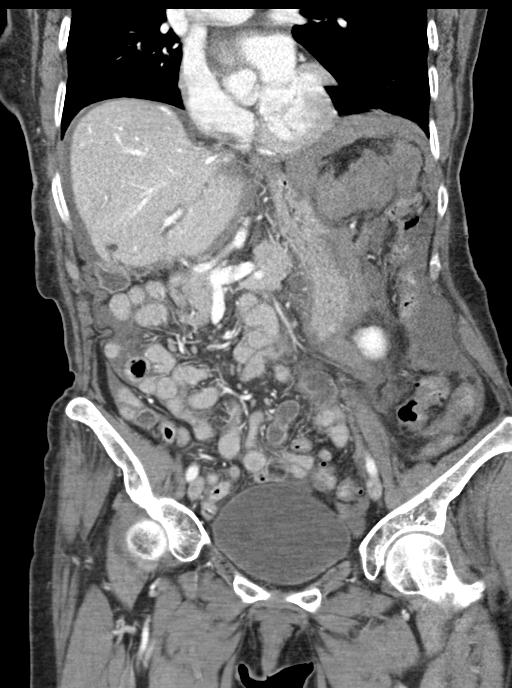
[im 48/86  soft-tissue]
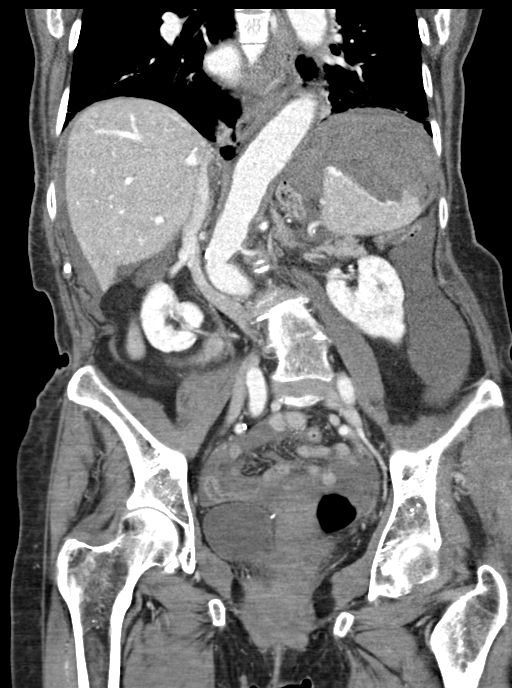

[16 of 46 positions shown; findings below may reference images not displayed]

FINDINGS: Lower chest: See report of dedicated chest CT today.

Hepatobiliary: Scattered cysts are seen in the liver. No worrisome
lesion. Biliary tree and gallbladder are negative.

Pancreas: Unremarkable. No pancreatic ductal dilatation or
surrounding inflammatory changes.

Spleen: The patient has a large spleen laceration with surrounding
hematoma. There is a small volume of active extravasation contrast
as best seen on images 20-23 of series 2.

Adrenals/Urinary Tract: Adrenal glands are unremarkable. Kidneys are
normal, without renal calculi, focal lesion, or hydronephrosis.
Bladder is unremarkable.

Stomach/Bowel: Stomach is within normal limits. Appendix appears
normal. No evidence of bowel wall thickening, distention, or
inflammatory changes.

Vascular/Lymphatic: Aortic atherosclerosis. No enlarged abdominal or
pelvic lymph nodes.

Reproductive: Uterus and bilateral adnexa are unremarkable.

Other: Scattered abdominal and pelvic fluid and hemorrhage are
identified.

Musculoskeletal: No acute or focal abnormality. Scoliosis and
multilevel lumbar degenerative change noted.
IMPRESSION: Large splenic laceration with a small volume active extravasation of
contrast material consistent with least a grade 3 injury. Recommend
consultation with surgery or interventional radiology. No other
acute abnormality.

Critical Value/emergent results were called by telephone at the time
of interpretation on 10/24/2020 at [DATE] to provider ANGELO ALEJANDRO ARREDONDO
, who verbally acknowledged these results.

## 2020-10-24 IMAGING — US IR ANGIO/VISCERAL SELECTIVE EA VESSEL WO/W FLUSH
1 series · 1 of 1 positions shown · non-contrast
Comparison: none

INDICATION: 84-year-old female presenting to the emergency department 1 day
after colonoscopy with upper abdominal pain. CT abdomen pelvis
significant for moderate to high-grade splenic laceration.

[Series 1: (id) · 1 of 1 slices shown]
[im 1/1]
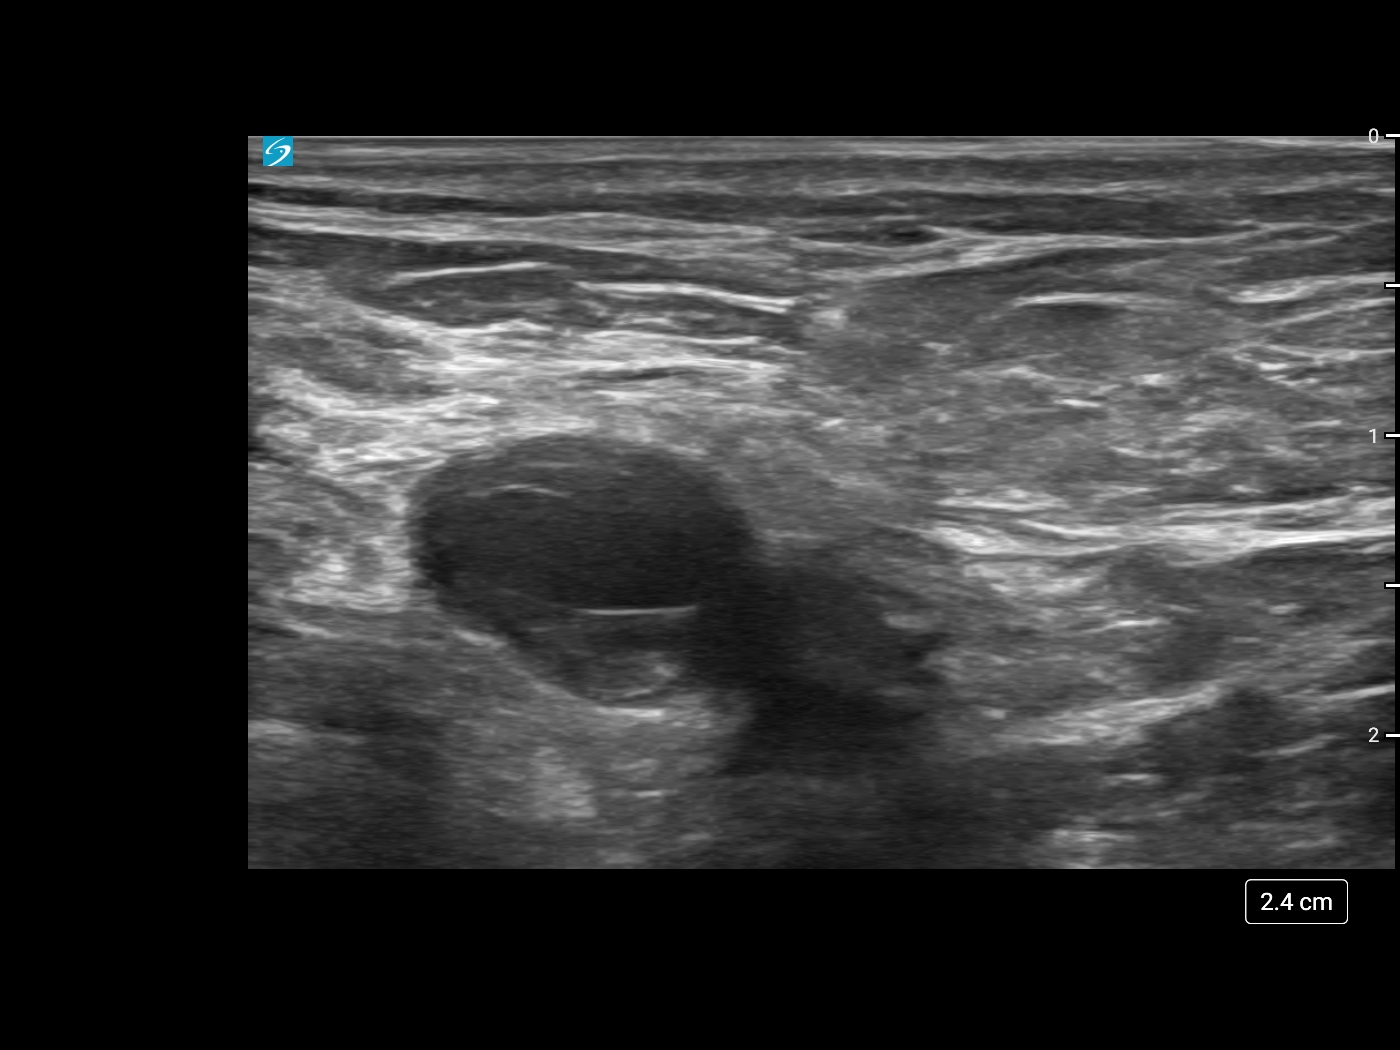

[1 of 1 positions shown; findings below may reference images not displayed]

EXAM:
1. Ultrasound-guided right common femoral artery access.
2. Abdominal aortogram.
3. Selective catheterization of the splenic artery.
4. Splenic arteriogram.
5. Coil embolization of the proximal splenic artery.

MEDICATIONS:
None.

ANESTHESIA/SEDATION:
Moderate (conscious) sedation was employed during this procedure. A
total of Versed 2 mg and Fentanyl 100 mcg was administered
intravenously.

Moderate Sedation Time: 67 minutes. The patient's level of
consciousness and vital signs were monitored continuously by
radiology nursing throughout the procedure under my direct
supervision.

CONTRAST:  50mL OMNIPAQUE IOHEXOL 300 MG/ML SOLN, 40mL OMNIPAQUE
IOHEXOL 300 MG/ML SOLN

FLUOROSCOPY TIME:  Fluoroscopy Time: 19 minutes 30 seconds (284
mGy).

COMPLICATIONS:
None immediate.



Under direct ultrasound visualization, the right common femoral
artery was accessed with a micropuncture kit. A permanent ultrasound
image was recorded. Limited right lower extremity angiogram
demonstrated adequate puncture site for vascular closure device use.
Through the 5 French outer micropuncture sheath, a J wire was
directed to the abdominal aorta. A 5 French, 11 cm vascular sheath
was placed. A 5 French, C2 catheter was then advanced to the level
of the celiac trunk. There was difficulty with engaging the celiac
ostium. Therefore, the C2 was exchanged for a pigtail flush
catheter. Abdominal aortic angiography was performed which
demonstrated normal caliber patent aorta with moderate tortuosity
related to the patient's scoliosis. The splenic artery appeared
patent. Given the tortuosity and difficulty with celiac
catheterization, a 35 cm 5 French sheath was inserted for stability.
The 5 French C2 catheter was then able to select the proximal
splenic artery. A Renegade Sonya Rupert microcatheter and 0.016 fathom
wire were then used to select the splenic artery. Splenic angiogram
was performed from proximal location which demonstrated irregularity
of the splenic parenchymal vessels near the hilum with a paucity of
opacification in the superior and peripheral spleen, compatible with
splenic laceration. No pseudoaneurysm or active extravasation was
visualized. An assortment of 4 mm to 7 mm Ruby micro coils were then
deployed in the proximal splenic artery. Repeat splenic angiography
after coil placement demonstrated adequate embolization of the
proximal splenic artery. The catheters and sheath were removed over
a guidewire and the right common femoral arteriotomy was closed with
a 6 French Angio-Seal device. Patient tolerated the procedure well
without immediate complication. The patient was transferred back to
the emergency department after the procedure.
IMPRESSION: 1. Angiographic findings compatible with splenic laceration, no
evidence of pseudoaneurysm or active extravasation.
2. Technically successful coil embolization of the proximal splenic
artery.

## 2020-10-24 IMAGING — CR DG CHEST 2V
2 series · 2 of 2 positions shown · non-contrast
Comparison: Chest CT October 21, 2017.

CLINICAL DATA: Shortness of breath

EXAM:
CHEST - 2 VIEW

[w chest lat]
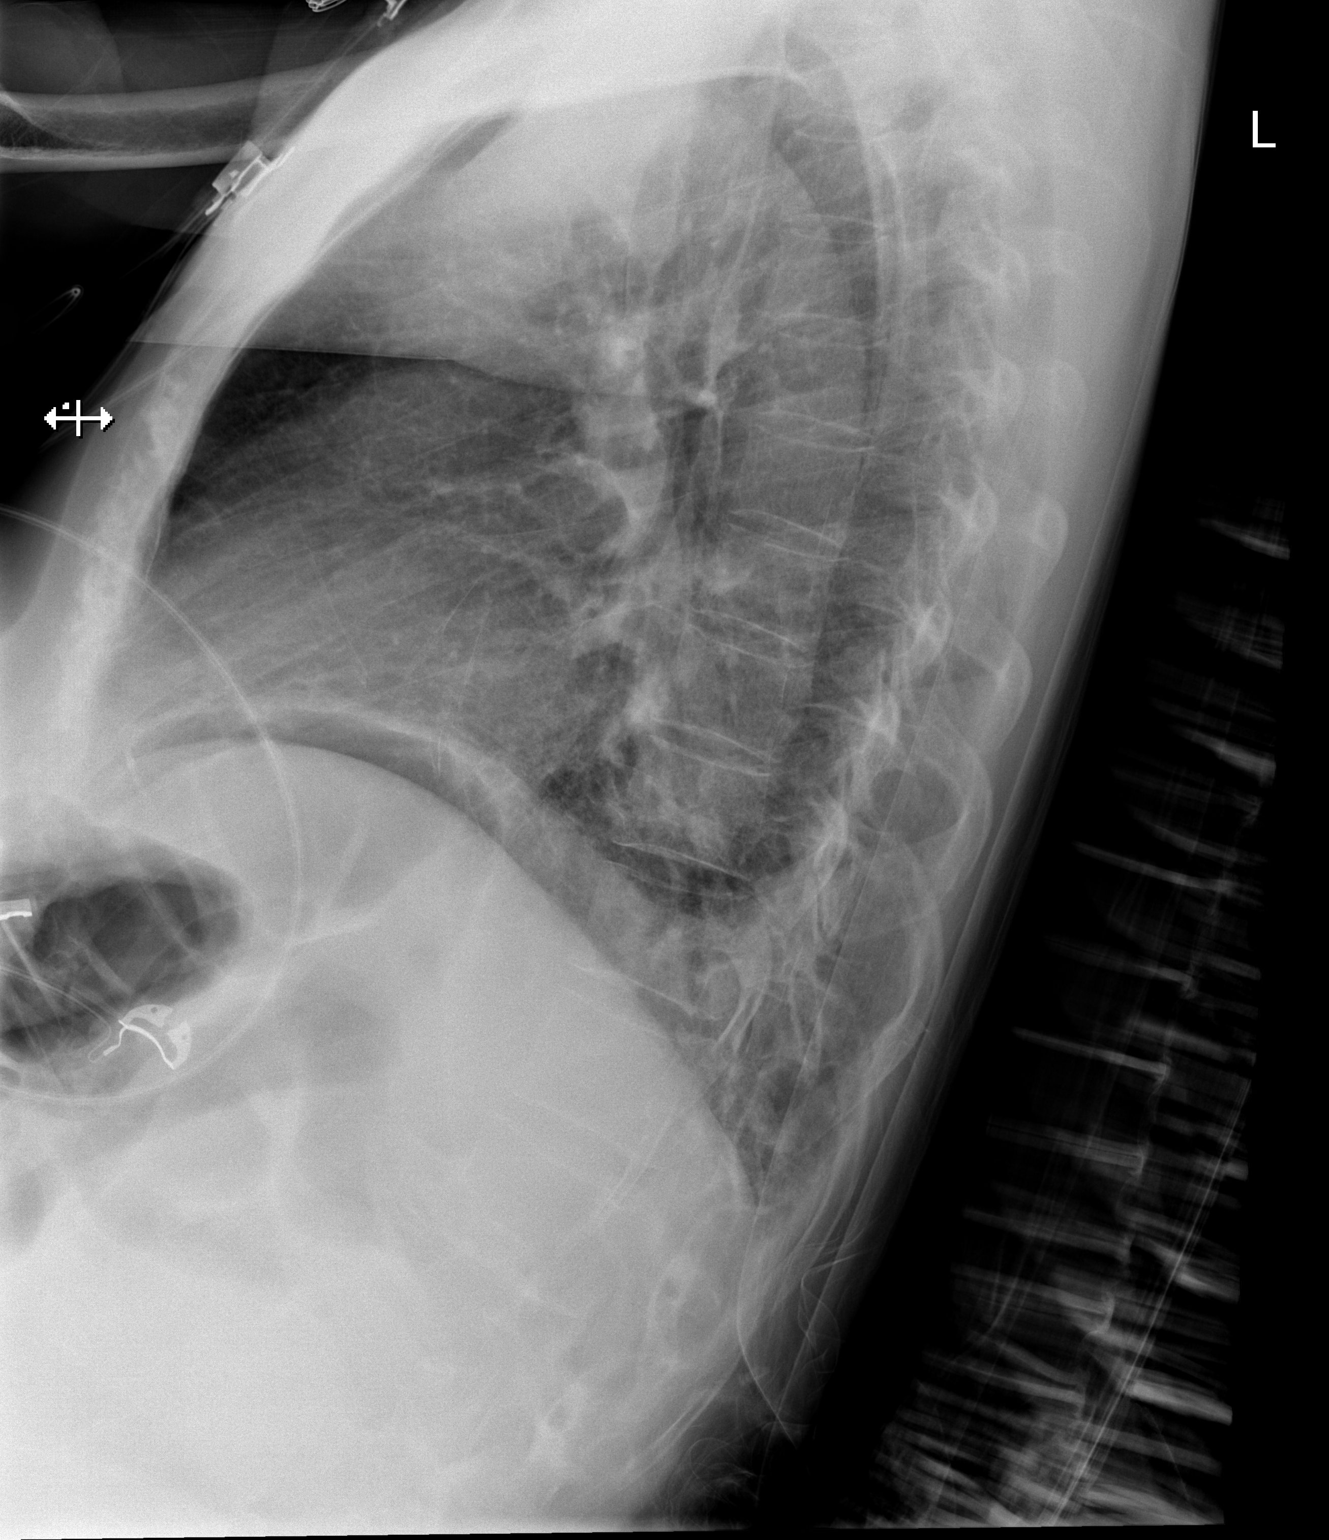

[x chest ap]
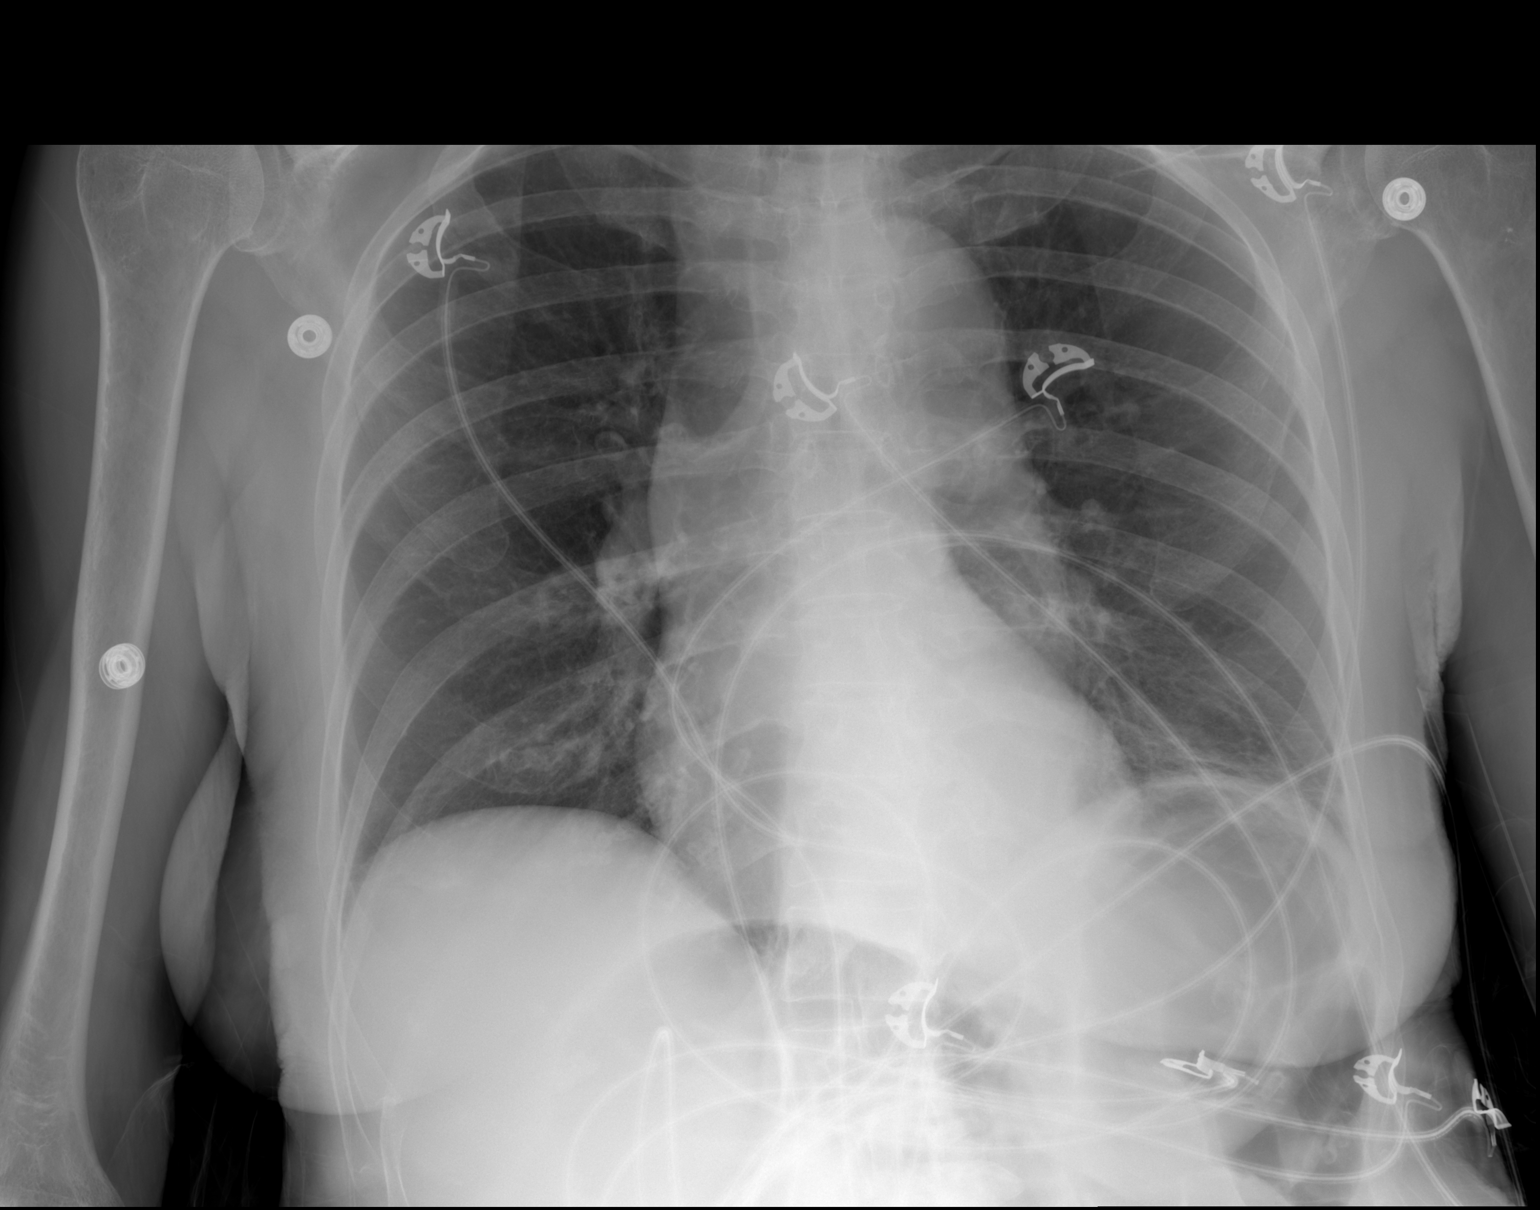

[2 of 2 positions shown; findings below may reference images not displayed]

FINDINGS: There is mild atelectatic change in the left base. The lungs
elsewhere are clear. The heart size and pulmonary vascularity are
normal. No adenopathy. No bone lesions.
IMPRESSION: Mild left base atelectasis. Lungs elsewhere clear. Cardiac
silhouette within normal limits.

## 2020-10-24 MED ORDER — SODIUM CHLORIDE 0.9% IV SOLUTION: Freq: Once | INTRAVENOUS | Status: DC

## 2020-10-24 MED ORDER — LIDOCAINE-EPINEPHRINE 1 %-1:100000 IJ SOLN
INTRAMUSCULAR | Status: AC | PRN
  Administered 2020-10-24: 10 mL via INTRADERMAL

## 2020-10-24 MED ORDER — SODIUM CHLORIDE 0.9 % IV SOLN: 10.0000 mL/h | Freq: Once | INTRAVENOUS | Status: DC

## 2020-10-24 MED ORDER — FENTANYL CITRATE (PF) 100 MCG/2ML IJ SOLN
INTRAMUSCULAR | Status: AC | PRN
  Administered 2020-10-24: 50 ug via INTRAVENOUS

## 2020-10-24 MED ORDER — MIDAZOLAM HCL 2 MG/2ML IJ SOLN
INTRAMUSCULAR | Status: AC | PRN
  Administered 2020-10-24: 0.5 mg via INTRAVENOUS

## 2020-10-24 MED ORDER — IOHEXOL 300 MG/ML  SOLN
100.0000 mL | Freq: Once | INTRAMUSCULAR | Status: AC | PRN
  Administered 2020-10-24: 40 mL via INTRA_ARTERIAL

## 2020-10-24 MED ORDER — LEVOTHYROXINE SODIUM 88 MCG PO TABS
88.0000 ug | ORAL_TABLET | Freq: Every day | ORAL | Status: DC
  Administered 2020-10-25 – 2020-10-27 (×3): 88 ug via ORAL
  Filled 2020-10-24 (×3): qty 1

## 2020-10-24 MED ORDER — FENTANYL CITRATE (PF) 100 MCG/2ML IJ SOLN
12.5000 ug | INTRAMUSCULAR | Status: DC | PRN
  Administered 2020-10-26: 12.5 ug via INTRAVENOUS
  Filled 2020-10-24: qty 2

## 2020-10-24 MED ORDER — SODIUM CHLORIDE 0.9 % IV SOLN
250.0000 mL | INTRAVENOUS | Status: DC
  Administered 2020-10-25: 250 mL via INTRAVENOUS

## 2020-10-24 MED ORDER — FENTANYL CITRATE (PF) 100 MCG/2ML IJ SOLN
INTRAMUSCULAR | Status: AC
  Filled 2020-10-24: qty 2

## 2020-10-24 MED ORDER — CHLORHEXIDINE GLUCONATE CLOTH 2 % EX PADS
6.0000 | MEDICATED_PAD | Freq: Every day | CUTANEOUS | Status: DC
  Administered 2020-10-24 – 2020-10-26 (×3): 6 via TOPICAL

## 2020-10-24 MED ORDER — SODIUM CHLORIDE 0.9 % IV BOLUS
500.0000 mL | Freq: Once | INTRAVENOUS | Status: AC
  Administered 2020-10-24: 500 mL via INTRAVENOUS

## 2020-10-24 MED ORDER — NOREPINEPHRINE 4 MG/250ML-% IV SOLN: 2.0000 ug/min | INTRAVENOUS | Status: DC

## 2020-10-24 MED ORDER — IOHEXOL 350 MG/ML SOLN
100.0000 mL | Freq: Once | INTRAVENOUS | Status: AC | PRN
  Administered 2020-10-24: 100 mL via INTRAVENOUS

## 2020-10-24 MED ORDER — DOCUSATE SODIUM 100 MG PO CAPS: 100.0000 mg | ORAL_CAPSULE | Freq: Two times a day (BID) | ORAL | Status: DC | PRN

## 2020-10-24 MED ORDER — POLYETHYLENE GLYCOL 3350 17 G PO PACK: 17.0000 g | PACK | Freq: Every day | ORAL | Status: DC | PRN

## 2020-10-24 MED ORDER — LIDOCAINE-EPINEPHRINE 1 %-1:100000 IJ SOLN
INTRAMUSCULAR | Status: AC
  Filled 2020-10-24: qty 1

## 2020-10-24 MED ORDER — ONDANSETRON HCL 4 MG/2ML IJ SOLN
4.0000 mg | Freq: Once | INTRAMUSCULAR | Status: AC
  Administered 2020-10-24: 4 mg via INTRAVENOUS
  Filled 2020-10-24: qty 2

## 2020-10-24 MED ORDER — NOREPINEPHRINE 4 MG/250ML-% IV SOLN
INTRAVENOUS | Status: AC
  Administered 2020-10-24: 4 ug/min via INTRAVENOUS
  Filled 2020-10-24: qty 250

## 2020-10-24 MED ORDER — FENTANYL CITRATE (PF) 100 MCG/2ML IJ SOLN
50.0000 ug | Freq: Once | INTRAMUSCULAR | Status: AC
  Administered 2020-10-24: 50 ug via INTRAVENOUS
  Filled 2020-10-24: qty 2

## 2020-10-24 MED ORDER — IOHEXOL 300 MG/ML  SOLN
100.0000 mL | Freq: Once | INTRAMUSCULAR | Status: AC | PRN
  Administered 2020-10-24: 50 mL via INTRA_ARTERIAL

## 2020-10-24 MED ORDER — ACETAMINOPHEN 10 MG/ML IV SOLN
1000.0000 mg | Freq: Four times a day (QID) | INTRAVENOUS | Status: AC | PRN
  Administered 2020-10-25 (×2): 1000 mg via INTRAVENOUS
  Filled 2020-10-24 (×2): qty 100

## 2020-10-24 MED ORDER — ALBUTEROL SULFATE HFA 108 (90 BASE) MCG/ACT IN AERS: 2.0000 | INHALATION_SPRAY | RESPIRATORY_TRACT | Status: DC | PRN

## 2020-10-24 MED ORDER — FENTANYL CITRATE (PF) 100 MCG/2ML IJ SOLN
INTRAMUSCULAR | Status: AC | PRN
  Administered 2020-10-24 (×2): 25 ug via INTRAVENOUS

## 2020-10-24 MED ORDER — MIDAZOLAM HCL 2 MG/2ML IJ SOLN
INTRAMUSCULAR | Status: AC
  Filled 2020-10-24: qty 2

## 2020-10-24 MED ORDER — SODIUM CHLORIDE 0.9 % IV BOLUS
1000.0000 mL | Freq: Once | INTRAVENOUS | Status: AC
  Administered 2020-10-24: 1000 mL via INTRAVENOUS

## 2020-10-24 MED ORDER — MIDAZOLAM HCL 2 MG/2ML IJ SOLN
INTRAMUSCULAR | Status: AC | PRN
  Administered 2020-10-24: 0.5 mg via INTRAVENOUS
  Administered 2020-10-24: 1 mg via INTRAVENOUS
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
## 2020-10-24 NOTE — ED Notes (Signed)
RN updated on delay in patient transport

## 2020-10-24 NOTE — ED Triage Notes (Signed)
About an hour ago [facility] come on suddenly, felt like moved from stomach to chest area. Nausea with hypotension in triage

## 2020-10-24 NOTE — ED Notes (Signed)
[facility] at bedside for transport. 
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
## 2020-10-24 NOTE — Progress Notes (Signed)
eLink Physician-Brief Progress Note Patient Name: Tara Bridges DOB: [birth year] MRN: [id number]   Date of Service  10/24/2020  HPI/Events of Note  Patient transferred from outside hospital for IR embolization of splenic artery after patient developed a splenic laceration as a complication from diagnostic colonoscopy. Patient also has acute blood loss anemia. She received 1 unit PRBC pre-procedure and is receiving a second unit post-procedure.  eICU Interventions  New Patient Evaluation completed, PRN iv Tylenol and Fentanyl ordered for pain.        [name] 10/24/2020, 11:24 PM

## 2020-10-24 NOTE — ED Notes (Signed)
Beckie Busing, RN updated on patient transfer status
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
## 2020-10-24 NOTE — Telephone Encounter (Signed)
This encounter was created in error - please disregard.

## 2020-10-24 NOTE — Procedures (Signed)
Interventional Radiology Procedure Note  Procedure: Splenic embolization  Findings: Please refer to procedural dictation for full description.  Proximal splenic artery coil embolization.  Right CFA access, closure with 6 Fr Angioseal.  Complications: None immediate  Estimated Blood Loss: < 5 mL  Recommendations: Strict 4 hour bedrest, supine.  Flat for first 2 hours, head of bed up to 30 degrees for 2nd 2 hours. IR will continue to follow.   [name], MD Pager: [phone]

## 2020-10-24 NOTE — Telephone Encounter (Signed)
Discussed with Dr. Carlean Purl.  Patient urgent ED evaluation.  Discussed with daughter and patient and they both verbalized understanding.  Patient is asking if this severe pain and difficulty breathing can be "gas".  Discussed that gas can be painful but should not make it difficult and painful to breathe.  She endorses painful breathing now.  They both agree to proceed to the ED now.

## 2020-10-24 NOTE — Telephone Encounter (Signed)
Called patient and spoke to daughter as well.  Shoulder pain is all gone.  Still a bit sore on the left side heating pad relieves that but when she takes it away and rolls and twists or turns it is worse.  She has been a little nauseous but no vertigo no syncopal or presyncopal type symptoms.  She ate breakfast and has had some coffee eggs and toast.  I have advised force fluids, use a heating pad as needed some Motrin 2 OTC every 6 hours as needed and I will call back to check on her later.  Working diagnosis is musculoskeletal issues related to colonoscopy with limping and abdominal pressure.

## 2020-10-24 NOTE — ED Notes (Signed)
Pt was unable to complete orthostatic VS due to her feeling dizzy while standing. Pt was unable to get back in bed by herself had to ask others to help get her back in bed.

## 2020-10-24 NOTE — ED Notes (Addendum)
Patient transported via [facility]. [facility] protocol initiated for transfer with transfusion.
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
## 2020-10-24 NOTE — Telephone Encounter (Signed)
Pt's daughter is calling back requesting for the pt to be seen asap in regards to her severe pain left abdominal area and she is experiencing nausea. Caller would like some advice on what to do.

## 2020-10-24 NOTE — ED Notes (Signed)
Patient transported to OR for IR procedure
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
## 2020-10-24 NOTE — Telephone Encounter (Signed)
Patient reports that "all of a sudden" Horrible nausea.  When I sat down she severe abdominal pain that started in her left lower quadrant and radiated to her chest.  She c/o difficulty breathing.  Pain is severe and she and daughter are worried .  Patient reports she tolerated a normal diet this am.  Was up cleaning and walking around the house when the pain abruptly started.  No fever, no BM today or passing gas today. Dr. Carlean Purl please advise

## 2020-10-25 LAB — TYPE AND SCREEN
ABO/RH(D): O POS
ABO/RH(D): O POS
Antibody Screen: NEGATIVE
Antibody Screen: NEGATIVE
Unit division: 0
Unit division: 0

## 2020-10-25 LAB — COMPREHENSIVE METABOLIC PANEL
ALT: 27 U/L (ref 0–44)
AST: 34 U/L (ref 15–41)
Albumin: 3 g/dL — ABNORMAL LOW (ref 3.5–5.0)
Alkaline Phosphatase: 46 U/L (ref 38–126)
Anion gap: 8 (ref 5–15)
BUN: 12 mg/dL (ref 8–23)
CO2: 21 mmol/L — ABNORMAL LOW (ref 22–32)
Calcium: 8.2 mg/dL — ABNORMAL LOW (ref 8.9–10.3)
Chloride: 105 mmol/L (ref 98–111)
Creatinine, Ser: 0.98 mg/dL (ref 0.44–1.00)
GFR, Estimated: 58 mL/min — ABNORMAL LOW (ref >60)
Glucose, Bld: 85 mg/dL (ref 70–99)
Potassium: 4.4 mmol/L (ref 3.5–5.1)
Sodium: 134 mmol/L — ABNORMAL LOW (ref 135–145)
Total Bilirubin: 0.9 mg/dL (ref 0.3–1.2)
Total Protein: 5.3 g/dL — ABNORMAL LOW (ref 6.5–8.1)

## 2020-10-25 LAB — BPAM RBC
Blood Product Expiration Date: 202203092359
Blood Product Expiration Date: 202203192359
ISSUE DATE / TIME: 202202181828
ISSUE DATE / TIME: 202202182217
Unit Type and Rh: 5100
Unit Type and Rh: 9500

## 2020-10-25 LAB — T4, FREE: Free T4: 0.95 ng/dL (ref 0.61–1.12)

## 2020-10-25 LAB — MRSA PCR SCREENING: MRSA by PCR: POSITIVE — AB

## 2020-10-25 LAB — CBC
HCT: 31.1 % — ABNORMAL LOW (ref 36.0–46.0)
Hemoglobin: 10.3 g/dL — ABNORMAL LOW (ref 12.0–15.0)
MCH: 30.4 pg (ref 26.0–34.0)
MCHC: 33.1 g/dL (ref 30.0–36.0)
MCV: 91.7 fL (ref 80.0–100.0)
Platelets: 189 10*3/uL (ref 150–400)
RBC: 3.39 MIL/uL — ABNORMAL LOW (ref 3.87–5.11)
RDW: 16.7 % — ABNORMAL HIGH (ref 11.5–15.5)
WBC: 7.6 10*3/uL (ref 4.0–10.5)
nRBC: 0 % (ref 0.0–0.2)

## 2020-10-25 LAB — TSH: TSH: 3.377 u[IU]/mL (ref 0.350–4.500)

## 2020-10-25 LAB — GLUCOSE, CAPILLARY: Glucose-Capillary: 76 mg/dL (ref 70–99)

## 2020-10-25 MED ORDER — SIMETHICONE 80 MG PO CHEW
80.0000 mg | CHEWABLE_TABLET | Freq: Four times a day (QID) | ORAL | Status: DC | PRN
  Administered 2020-10-25 – 2020-10-26 (×2): 80 mg via ORAL
  Filled 2020-10-25 (×3): qty 1

## 2020-10-25 MED ORDER — MUPIROCIN 2 % EX OINT
1.0000 "application " | TOPICAL_OINTMENT | Freq: Two times a day (BID) | CUTANEOUS | Status: DC
  Administered 2020-10-25 – 2020-10-27 (×5): 1 via NASAL
  Filled 2020-10-25: qty 22

## 2020-10-25 MED ORDER — SALINE SPRAY 0.65 % NA SOLN
1.0000 | NASAL | Status: DC | PRN
  Filled 2020-10-25 (×2): qty 44

## 2020-10-25 MED ORDER — ENSURE ENLIVE PO LIQD
237.0000 mL | Freq: Two times a day (BID) | ORAL | Status: DC
  Administered 2020-10-25 – 2020-10-26 (×2): 237 mL via ORAL
  Filled 2020-10-25: qty 237

## 2020-10-25 MED ORDER — WHITE PETROLATUM EX OINT
TOPICAL_OINTMENT | CUTANEOUS | Status: AC
  Filled 2020-10-25: qty 28.35

## 2020-10-25 MED ORDER — BOOST / RESOURCE BREEZE PO LIQD CUSTOM
1.0000 | Freq: Two times a day (BID) | ORAL | Status: DC
  Administered 2020-10-25 – 2020-10-27 (×3): 1 via ORAL

## 2020-10-25 MED ORDER — ADULT MULTIVITAMIN W/MINERALS CH
1.0000 | ORAL_TABLET | Freq: Every day | ORAL | Status: DC
  Administered 2020-10-25 – 2020-10-27 (×3): 1 via ORAL
  Filled 2020-10-25 (×3): qty 1

## 2020-10-25 NOTE — Progress Notes (Signed)
[facility]link made aware of positive MRSA of nares result. New order for mupirocin BID.

## 2020-10-25 NOTE — Plan of Care (Signed)

## 2020-10-25 NOTE — Progress Notes (Signed)
Referring Physician(s): Dr. Lennice Sites  Supervising Physician: Corrie Mckusick  Patient Status:  [facility] - In-pt  Chief Complaint: Splenic laceration following colonoscopy; S/p splenic angiogram with embolization   Subjective: Patient is sitting up in bed, her daughter is at the bedside. She states she feels much better and denies any pain or discomfort. Her nurse is at the bedside and will obtain morning labs soon.   Allergies: Cephalexin, Doxycycline, Nitrofurantoin, Penicillins, Nitrofurantoin macrocrystal, and Lipitor [atorvastatin]  Medications: Prior to Admission medications   Medication Sig Start Date End Date Taking? Authorizing Provider  amLODipine (NORVASC) 10 MG tablet TAKE 1 TABLET BY MOUTH EVERY DAY IN THE EVENING Patient taking differently: Take 10 mg by mouth every evening. 05/07/20  Yes Adrian Prows, MD  atorvastatin (LIPITOR) 10 MG tablet Take 1 tablet (10 mg total) by mouth daily. Patient taking differently: Take 10 mg by mouth daily. Once a week 12/28/19 12/22/20 Yes Adrian Prows, MD  lisinopril (ZESTRIL) 20 MG tablet TAKE 1 TABLET BY MOUTH EVERY DAY 08/05/20  Yes Adrian Prows, MD  Biotin 300 MCG TABS Take by mouth daily.    [provider]  Calcium Carbonate-Vit D-Min (CALCIUM 1200 PO) Take by mouth. Viactiv    [provider]  Cholecalciferol (VITAMIN D-3) 5000 UNITS TABS Take by mouth daily.    [provider]  KRILL OIL PO Take by mouth. 3 x a week    [provider]  levothyroxine (SYNTHROID) 88 MCG tablet Take 88 mcg by mouth daily before breakfast. Tuesdays, Thursdays, Saturdays Sundays only take 1/2 tablet    [provider]  meloxicam (MOBIC) 7.5 MG tablet TAKE 1 TABLET BY MOUTH ONE TO TWO TIMES DAILY AS NEEDED 02/10/17   Binnie Rail, MD  NON FORMULARY Place 1 spray under the tongue 2 (two) times daily.    [provider]  PREMARIN vaginal cream USE 2 GRAMS VAGINALLY EVERY NIGHT AT BEDTIME 1 -2 TIMES PER WEEK  04/21/15   [provider]  psyllium (KONSYL) 33 % POWD Take by mouth.    [provider]     Vital Signs: BP 97/77   Pulse 62   Temp 98 F (36.7 C) (Oral)   Resp (!) 21   Ht 5\' 6"  (1.676 m)   Wt 144 lb 2.9 oz (65.4 kg)   SpO2 95%   BMI 23.27 kg/m   Physical Exam Constitutional:      General: She is not in acute distress. Cardiovascular:     Rate and Rhythm: Normal rate and regular rhythm.     Comments: Right femoral groin site is soft and non-tender. Dressing is clean and dry.  Pulmonary:     Effort: Pulmonary effort is normal.  Abdominal:     Tenderness: There is no abdominal tenderness.  Skin:    General: Skin is warm and dry.  Neurological:     Mental Status: She is alert and oriented to person, place, and time.     Imaging: DG Chest 2 View  Result Date: 10/24/2020 CLINICAL DATA:  Shortness of breath EXAM: CHEST - 2 VIEW COMPARISON:  Chest CT October 21, 2017. FINDINGS: There is mild atelectatic change in the left base. The lungs elsewhere are clear. The heart size and pulmonary vascularity are normal. No adenopathy. No bone lesions. IMPRESSION: Mild left base atelectasis. Lungs elsewhere clear. Cardiac silhouette within normal limits. Electronically Signed   By: Lowella Grip III M.D.   On: 10/24/2020 15:15   CT  Angio Chest PE W and/or Wo Contrast  Result Date: 10/24/2020 CLINICAL DATA:  Onset shortness of breath and abdominal pain today. Status post EGD and colonoscopy yesterday. EXAM: CT ANGIOGRAPHY CHEST WITH CONTRAST TECHNIQUE: Multidetector CT imaging of the chest was performed using the standard protocol during bolus administration of intravenous contrast. Multiplanar CT image reconstructions and MIPs were obtained to evaluate the vascular anatomy. CONTRAST:  100 mL OMNIPAQUE IOHEXOL 350 MG/ML SOLN COMPARISON:  PA and lateral chest earlier today. FINDINGS: Cardiovascular: Satisfactory opacification of the pulmonary arteries to the segmental  level. No evidence of pulmonary embolism. Normal heart size. No pericardial effusion. Mediastinum/Nodes: No enlarged mediastinal, hilar, or axillary lymph nodes. Thyroid gland, trachea, and esophagus demonstrate no significant findings. Lungs/Pleura: Lungs demonstrate mild dependent atelectasis. Trace pleural effusions. Upper Abdomen: See report of dedicated abdomen and pelvis CT this same day. Musculoskeletal: No acute abnormality. Review of the MIP images confirms the above findings. IMPRESSION: Negative for pulmonary embolus. Trace bilateral pleural effusions and mild dependent atelectasis. Electronically Signed   By: Inge Rise M.D.   On: 10/24/2020 17:52   CT ABDOMEN PELVIS W CONTRAST  Result Date: 10/24/2020 CLINICAL DATA:  Onset shortness of breath and abdominal pain today. EXAM: CT ABDOMEN AND PELVIS WITH CONTRAST TECHNIQUE: Multidetector CT imaging of the abdomen and pelvis was performed using the standard protocol following bolus administration of intravenous contrast. CONTRAST:  100 mL OMNIPAQUE IOHEXOL 350 MG/ML SOLN COMPARISON:  None. FINDINGS: Lower chest: See report of dedicated chest CT today. Hepatobiliary: Scattered cysts are seen in the liver. No worrisome lesion. Biliary tree and gallbladder are negative. Pancreas: Unremarkable. No pancreatic ductal dilatation or surrounding inflammatory changes. Spleen: The patient has a large spleen laceration with surrounding hematoma. There is a small volume of active extravasation contrast as best seen on images 20-23 of series 2. Adrenals/Urinary Tract: Adrenal glands are unremarkable. Kidneys are normal, without renal calculi, focal lesion, or hydronephrosis. Bladder is unremarkable. Stomach/Bowel: Stomach is within normal limits. Appendix appears normal. No evidence of bowel wall thickening, distention, or inflammatory changes. Vascular/Lymphatic: Aortic atherosclerosis. No enlarged abdominal or pelvic lymph nodes. Reproductive: Uterus and  bilateral adnexa are unremarkable. Other: Scattered abdominal and pelvic fluid and hemorrhage are identified. Musculoskeletal: No acute or focal abnormality. Scoliosis and multilevel lumbar degenerative change noted. IMPRESSION: Large splenic laceration with a small volume active extravasation of contrast material consistent with least a grade 3 injury. Recommend consultation with surgery or interventional radiology. No other acute abnormality. Critical Value/emergent results were called by telephone at the time of interpretation on 10/24/2020 at 5:45 pm to provider ADAM CURATOLO , who verbally acknowledged these results. Electronically Signed   By: Inge Rise M.D.   On: 10/24/2020 17:46    Labs:  CBC: Recent Labs    08/21/20 1046 10/24/20 1515  WBC 5.2 13.2*  HGB 13.9 12.2  HCT 41.7 37.7  PLT 285.0 281    COAGS: No results for input(s): INR, APTT in the last 8760 hours.  BMP: Recent Labs    08/21/20 1046 10/24/20 1515  NA 133* 131*  K 4.5 4.0  CL 99 98  CO2 28 21*  GLUCOSE 86 120*  BUN 25* 17  CALCIUM 9.4 9.0  CREATININE 1.10 1.04*  GFRNONAA  --  54*    LIVER FUNCTION TESTS: Recent Labs    08/21/20 1046 10/24/20 1515  BILITOT 0.6 0.9  AST 12 18  ALT 10 12  ALKPHOS 64 57  PROT 7.7 6.4*  ALBUMIN 4.3 3.8  Assessment and Plan:  Splenic laceration following colonoscopy; S/p splenic angiogram: AM labs pending. Patient denies any pain or discomfort. Her period of bedrest is up; no activity limitation from IR standpoint. Right femoral groin site is soft and non-tender; dressing may be removed this evening at 8 pm.   Other plans per primary teams. Please call IR with any questions.   Electronically Signed: Soyla Dryer, AGACNP-BC [phone] 10/25/2020, 8:23 AM   I spent a total of 15 Minutes at the the patient's bedside AND on the patient's hospital floor or unit, greater than 50% of which was counseling/coordinating care for splenic angiogram with  embolization follow up.

## 2020-10-25 NOTE — Progress Notes (Signed)
NAME:  Tara Bridges, MRN:  [id number], DOB:  1936/05/17, LOS: 1 ADMISSION DATE:  10/24/2020, CONSULTATION DATE:  10/24/20 REFERRING MD:  Ronnald Nian, CHIEF COMPLAINT:  Abdominal pain  Brief History   84yF s/p diagnostic colonoscopy c/b grade 3 splenic laceration undergoing IR embolization  History of present illness   84yF with history of HTN, hypothyroid who underwent diagnostic EGD, colonoscopy 10/23/20 for reported weight loss, early satiety, loose stools. After the procedure reported right shoulder pain and then LUQ pain which progressively worsened on the day of admission. Is worse with deep inspiration. Limited amount of BRBPR on night after procedure but otherwise no reported hematochezia/melena.   In the ED she was orthostatic and hypotensive, bradycardic. She was given 1U pRBC with improvement in BP.    Past Medical History  HTN Hypothyroid  Significant Hospital Events   10/24/20 IR embolization underway  Consults:  PCCM IR General Surgery  Procedures:  10/24/20 EGD A few dispersed diminutive erosions with no stigmata of recent bleeding were found in the prepyloric region of the stomach. Biopsies were taken with a cold forceps for histology. Verification of patient identification for the specimen was done. Estimated blood loss was minimal. Findings: - The exam was otherwise without abnormality. - The cardia and gastric fundus were normal on retroflexion.  10/24/20 Colonoscopy  -One diminutive polyp in the sigmoid colon, removed with a cold snare. Resected and retrieved. - Diverticulosis in the sigmoid colon. - The examined portion of the ileum was normal. - The examination was otherwise normal on direct and retroflexion views.  Significant Diagnostic Tests:   CT A/P with contrast 10/24/20: Large splenic laceration with a small volume active extravasation of contrast material consistent with least a grade 3 injury. Recommend consultation with surgery or interventional  radiology. No other acute abnormality.  CTA Chest 10/24/20: Negative for pulmonary embolus.Trace bilateral pleural effusions and mild dependent atelectasis.  Micro Data:  Respiratory panel pending  Antimicrobials:  None  Interim history/subjective:   Norepinephrine 4 Morning hemoglobin pending  Objective   Blood pressure (!) 115/47, pulse (!) 56, temperature 97.7 F (36.5 C), resp. rate 13, height 5\' 6"  (1.676 m), weight 65.4 kg, SpO2 98 %.        Intake/Output Summary (Last 24 hours) at 10/25/2020 0731 Last data filed at 10/25/2020 0600 Gross per 24 hour  Intake 469.4 ml  Output 200 ml  Net 269.4 ml   Filed Weights   10/24/20 1447 10/24/20 1518 10/25/20 0500  Weight: 61.9 kg 59 kg 65.4 kg    Examination: General: alert/oriented x3 HENT: NCAT, dry MM Lungs: CTAB, normal work of breathing Cardiovascular: RRR, no murmur, no JVD  Abdomen: soft, nontender, normal bowel sounds Extremities: warm, well-perfused without cyanosis, edema Neuro: grossly nonfocal, follows commands  Resolved Hospital Problem list   n/a  Assessment & Plan:   # Splenic laceration: s/p transfusion 1U pRBC with improvement in hypotension, currently in IR suite with plan for attempt at embolization. -Hemoglobin stabilized 2/18 evening, morning labs pending -DIC panel, question ordered -Follow hemodynamics post embolization  #Hemorrhagic shock -IV fluids, PRBC resuscitation -Norepinephrine weaned off  # sinus bradycardia: may be related to abdominal distension/possible developing ileus -Follow electrolytes -Check TSH  # hypertension: -Holding home lisinopril and amlodipine  # hypothyroid: -Continue home Synthroid  Best practice:  Diet: soft diet Pain/Anxiety/Delirium protocol (if indicated): no VAP protocol (if indicated): no DVT prophylaxis: SCDs GI prophylaxis: not indicated Glucose control: will follow glucose trend on labs (not diabetic) Mobility:  bed level Code Status: Full,  confirmed with patient just prior to IR procedure Family Communication: updated daughter Benjamine Mola at bedside 2/19 Disposition: ICU >> transition to progressive if remains off norepi   Labs   CBC: Recent Labs  Lab 10/24/20 1515  WBC 13.2*  NEUTROABS 10.2*  HGB 12.2  HCT 37.7  MCV 96.9  PLT 657    Basic Metabolic Panel: Recent Labs  Lab 10/24/20 1515  NA 131*  K 4.0  CL 98  CO2 21*  GLUCOSE 120*  BUN 17  CREATININE 1.04*  CALCIUM 9.0   GFR: Estimated Creatinine Clearance: 39 mL/min (A) (by C-G formula based on SCr of 1.04 mg/dL (H)). Recent Labs  Lab 10/24/20 1515 10/24/20 1516  WBC 13.2*  --   LATICACIDVEN  --  1.4    Liver Function Tests: Recent Labs  Lab 10/24/20 1515  AST 18  ALT 12  ALKPHOS 57  BILITOT 0.9  PROT 6.4*  ALBUMIN 3.8   Recent Labs  Lab 10/24/20 1515  LIPASE 27   No results for input(s): AMMONIA in the last 168 hours.  ABG No results found for: PHART, PCO2ART, PO2ART, HCO3, TCO2, ACIDBASEDEF, O2SAT   Coagulation Profile: No results for input(s): INR, PROTIME in the last 168 hours.  Cardiac Enzymes: No results for input(s): CKTOTAL, CKMB, CKMBINDEX, TROPONINI in the last 168 hours.  HbA1C: No results found for: HGBA1C  CBG: Recent Labs  Lab 10/24/20 2256  GLUCAP 116*     Critical care time: 31 minutes    This patient is critically ill with splenic laceration; which, requires frequent high complexity decision making, assessment, support, evaluation, and titration of therapies. This was completed through the application of advanced monitoring technologies and extensive interpretation of multiple databases. During this encounter critical care time was devoted to patient care services described in this note for 31 minutes.  Baltazar Apo, MD, PhD 10/25/2020, 9:39 AM [facility] [phone] or if no answer before 7:00PM call [phone] For any issues after 7:00PM please call eLink  [phone]

## 2020-10-25 NOTE — Progress Notes (Signed)
Initial Nutrition Assessment  DOCUMENTATION CODES:   Not applicable  INTERVENTION:  Boost Breeze po BID, each supplement provides 250 kcal and 9 grams of protein  Continue Ensure Enlive po BID, each supplement provides 350 kcal and 20 grams of protein  MVI with minerals  NUTRITION DIAGNOSIS:   Inadequate oral intake related to poor appetite as evidenced by meal completion < 50%.    GOAL:   Patient will meet greater than or equal to 90% of their needs    MONITOR:   PO intake,Supplement acceptance,Weight trends,Skin,I & O's,Labs,Diet advancement  REASON FOR ASSESSMENT:   Malnutrition Screening Tool    ASSESSMENT:  85 year old female admitted with hemorrhagic shock due to laceration of spleen s/p 2/17 diagnostic EGD and colonoscopy. Past medical history of HLD, scoliosis, osteopenia, Raynaud's syndrome, and thyroid disease presented with progressive abdominal pain.  RD working remotely.  Pt is s/p IR embolization of grade 3 splenic laceration on 2/18.  Hemoglobin down from 12 to 10.3 today after a couple of units of PRBC overnight, noted very stable and off pressors.   Unable to reach pt via phone this afternoon to obtain nutrition history. Diet advanced to soft this morning. Per notes, monitoring diet as pt may get ileus from embolization as well as moderate amount of hemoperitoneum. Her appetite and intake has been very poor, eating 10-25% of meals at this time. Pt is ordered Ensure BID, will also order Boost Breeze to help her meet her needs.  Per chart, weights decreased ~10 lbs (7.2%) from 65.5 kg on 12/28/19 to 6.8 kg on 08/26/20. Appears weights have trended back up to her usual weight and currently weighs 65.4 kg (143.88 lbs)  I/Os: +279 ml since admit UOP: 200 ml x 24 hrs   Medications reviewed   Labs: Na 134 (L)   NUTRITION - FOCUSED PHYSICAL EXAM:  Unable to complete at this time  Diet Order:   Diet Order            DIET SOFT Room service  appropriate? Yes; Fluid consistency: Thin  Diet effective now                 EDUCATION NEEDS:   No education needs have been identified at this time  Skin:  Skin Assessment: Skin Integrity Issues: Skin Integrity Issues:: Other (Comment) Other: wound; right thigh  Last BM:  pta  Height:   Ht Readings from Last 1 Encounters:  10/24/20 5\' 6"  (1.676 m)    Weight:   Wt Readings from Last 1 Encounters:  10/25/20 65.4 kg    BMI:  Body mass index is 23.27 kg/m.  Estimated Nutritional Needs:   Kcal:  1850-2050  Protein:  95-110  Fluid:  >1.6 L  Lajuan Lines, RD, LDN Clinical Nutrition After Hours/Weekend Pager # in [place]

## 2020-10-25 NOTE — Progress Notes (Signed)
Subjective: Patient having some pain but states it seems controlled with Tylenol, but she is just laying in bed and hasn't mobilized yet.  Denies nausea.  No belching, no flatus.  Denies distention  ROS: See above, otherwise other systems negative  Objective: Vital signs in last 24 hours: Temp:  [97.3 F (36.3 C)-98 F (36.7 C)] 98 F (36.7 C) (02/19 0732) Pulse Rate:  [41-94] 58 (02/19 0930) Resp:  [11-26] 18 (02/19 0930) BP: (73-137)/(45-81) 120/53 (02/19 0930) SpO2:  [88 %-100 %] 96 % (02/19 0930) Weight:  [59 kg-65.4 kg] 65.4 kg (02/19 0500) Last BM Date:  (PTA)  Intake/Output from previous day: 02/18 0701 - 02/19 0700 In: 479.4 [I.V.:33.6; Blood:348; IV Piggyback:97.8] Out: 200 [Urine:200] Intake/Output this shift: Total I/O In: 120.1 [I.V.:20; IV Piggyback:100.1] Out: 200 [Urine:200]  PE: Abd: soft, appropriately tender, greatest in LUQ as expected, few BS noted, ND  Lab Results:  Recent Labs    10/24/20 1515 10/25/20 0855  WBC 13.2* 7.6  HGB 12.2 10.3*  HCT 37.7 31.1*  PLT 281 189   BMET Recent Labs    10/24/20 1515 10/25/20 0855  NA 131* 134*  K 4.0 4.4  CL 98 105  CO2 21* 21*  GLUCOSE 120* 85  BUN 17 12  CREATININE 1.04* 0.98  CALCIUM 9.0 8.2*   PT/INR No results for input(s): LABPROT, INR in the last 72 hours. CMP     Component Value Date/Time   NA 134 (L) 10/25/2020 0855   NA 137 08/01/2019 0941   K 4.4 10/25/2020 0855   CL 105 10/25/2020 0855   CO2 21 (L) 10/25/2020 0855   GLUCOSE 85 10/25/2020 0855   GLUCOSE 87 07/15/2006 0000   BUN 12 10/25/2020 0855   BUN 28 (H) 08/01/2019 0941   CREATININE 0.98 10/25/2020 0855   CALCIUM 8.2 (L) 10/25/2020 0855   PROT 5.3 (L) 10/25/2020 0855   PROT 7.3 05/29/2019 1008   ALBUMIN 3.0 (L) 10/25/2020 0855   ALBUMIN 4.4 05/29/2019 1008   AST 34 10/25/2020 0855   ALT 27 10/25/2020 0855   ALKPHOS 46 10/25/2020 0855   BILITOT 0.9 10/25/2020 0855   BILITOT 0.4 05/29/2019 1008   GFRNONAA 58  (L) 10/25/2020 0855   GFRAA 56 (L) 08/01/2019 0941   Lipase     Component Value Date/Time   LIPASE 27 10/24/2020 1515       Studies/Results: DG Chest 2 View  Result Date: 10/24/2020 CLINICAL DATA:  Shortness of breath EXAM: CHEST - 2 VIEW COMPARISON:  Chest CT October 21, 2017. FINDINGS: There is mild atelectatic change in the left base. The lungs elsewhere are clear. The heart size and pulmonary vascularity are normal. No adenopathy. No bone lesions. IMPRESSION: Mild left base atelectasis. Lungs elsewhere clear. Cardiac silhouette within normal limits. Electronically Signed   By: Lowella Grip III M.D.   On: 10/24/2020 15:15   CT Angio Chest PE W and/or Wo Contrast  Result Date: 10/24/2020 CLINICAL DATA:  Onset shortness of breath and abdominal pain today. Status post EGD and colonoscopy yesterday. EXAM: CT ANGIOGRAPHY CHEST WITH CONTRAST TECHNIQUE: Multidetector CT imaging of the chest was performed using the standard protocol during bolus administration of intravenous contrast. Multiplanar CT image reconstructions and MIPs were obtained to evaluate the vascular anatomy. CONTRAST:  100 mL OMNIPAQUE IOHEXOL 350 MG/ML SOLN COMPARISON:  PA and lateral chest earlier today. FINDINGS: Cardiovascular: Satisfactory opacification of the pulmonary arteries to the segmental level. No evidence of  pulmonary embolism. Normal heart size. No pericardial effusion. Mediastinum/Nodes: No enlarged mediastinal, hilar, or axillary lymph nodes. Thyroid gland, trachea, and esophagus demonstrate no significant findings. Lungs/Pleura: Lungs demonstrate mild dependent atelectasis. Trace pleural effusions. Upper Abdomen: See report of dedicated abdomen and pelvis CT this same day. Musculoskeletal: No acute abnormality. Review of the MIP images confirms the above findings. IMPRESSION: Negative for pulmonary embolus. Trace bilateral pleural effusions and mild dependent atelectasis. Electronically Signed   By: Inge Rise M.D.   On: 10/24/2020 17:52   CT ABDOMEN PELVIS W CONTRAST  Result Date: 10/24/2020 CLINICAL DATA:  Onset shortness of breath and abdominal pain today. EXAM: CT ABDOMEN AND PELVIS WITH CONTRAST TECHNIQUE: Multidetector CT imaging of the abdomen and pelvis was performed using the standard protocol following bolus administration of intravenous contrast. CONTRAST:  100 mL OMNIPAQUE IOHEXOL 350 MG/ML SOLN COMPARISON:  None. FINDINGS: Lower chest: See report of dedicated chest CT today. Hepatobiliary: Scattered cysts are seen in the liver. No worrisome lesion. Biliary tree and gallbladder are negative. Pancreas: Unremarkable. No pancreatic ductal dilatation or surrounding inflammatory changes. Spleen: The patient has a large spleen laceration with surrounding hematoma. There is a small volume of active extravasation contrast as best seen on images 20-23 of series 2. Adrenals/Urinary Tract: Adrenal glands are unremarkable. Kidneys are normal, without renal calculi, focal lesion, or hydronephrosis. Bladder is unremarkable. Stomach/Bowel: Stomach is within normal limits. Appendix appears normal. No evidence of bowel wall thickening, distention, or inflammatory changes. Vascular/Lymphatic: Aortic atherosclerosis. No enlarged abdominal or pelvic lymph nodes. Reproductive: Uterus and bilateral adnexa are unremarkable. Other: Scattered abdominal and pelvic fluid and hemorrhage are identified. Musculoskeletal: No acute or focal abnormality. Scoliosis and multilevel lumbar degenerative change noted. IMPRESSION: Large splenic laceration with a small volume active extravasation of contrast material consistent with least a grade 3 injury. Recommend consultation with surgery or interventional radiology. No other acute abnormality. Critical Value/emergent results were called by telephone at the time of interpretation on 10/24/2020 at 5:45 pm to provider ADAM CURATOLO , who verbally acknowledged these results.  Electronically Signed   By: Inge Rise M.D.   On: 10/24/2020 17:46   IR Aortagram Abdominal Serialogram  Result Date: 10/25/2020 INDICATION: 85 year old female presenting to the emergency department 1 day after colonoscopy with upper abdominal pain. CT abdomen pelvis significant for moderate to high-grade splenic laceration. EXAM: 1. Ultrasound-guided right common femoral artery access. 2. Abdominal aortogram. 3. Selective catheterization of the splenic artery. 4. Splenic arteriogram. 5. Coil embolization of the proximal splenic artery. MEDICATIONS: None. ANESTHESIA/SEDATION: Moderate (conscious) sedation was employed during this procedure. A total of Versed 2 mg and Fentanyl 100 mcg was administered intravenously. Moderate Sedation Time: 67 minutes. The patient's level of consciousness and vital signs were monitored continuously by radiology nursing throughout the procedure under my direct supervision. CONTRAST:  56m OMNIPAQUE IOHEXOL 300 MG/ML SOLN, 423mOMNIPAQUE IOHEXOL 300 MG/ML SOLN FLUOROSCOPY TIME:  Fluoroscopy Time: 19 minutes 30 seconds (284 mGy). COMPLICATIONS: None immediate. PROCEDURE: Informed consent was obtained from the patient following explanation of the procedure, risks, benefits and alternatives. The patient understands, agrees and consents for the procedure. All questions were addressed. A time out was performed prior to the initiation of the procedure. Maximal barrier sterile technique utilized including caps, mask, sterile gowns, sterile gloves, large sterile drape, hand hygiene, and chlorhexidine prep. Under direct ultrasound visualization, the right common femoral artery was accessed with a micropuncture kit. A permanent ultrasound image was recorded. Limited right lower extremity angiogram demonstrated  adequate puncture site for vascular closure device use. Through the 5 French outer micropuncture sheath, a J wire was directed to the abdominal aorta. A 5 French, 11 cm vascular  sheath was placed. A 5 French, C2 catheter was then advanced to the level of the celiac trunk. There was difficulty with engaging the celiac ostium. Therefore, the C2 was exchanged for a pigtail flush catheter. Abdominal aortic angiography was performed which demonstrated normal caliber patent aorta with moderate tortuosity related to the patient's scoliosis. The splenic artery appeared patent. Given the tortuosity and difficulty with celiac catheterization, a 35 cm 5 French sheath was inserted for stability. The 5 French C2 catheter was then able to select the proximal splenic artery. A Renegade hi Flo microcatheter and 0.016 fathom wire were then used to select the splenic artery. Splenic angiogram was performed from proximal location which demonstrated irregularity of the splenic parenchymal vessels near the hilum with a paucity of opacification in the superior and peripheral spleen, compatible with splenic laceration. No pseudoaneurysm or active extravasation was visualized. An assortment of 4 mm to 7 mm Ruby micro coils were then deployed in the proximal splenic artery. Repeat splenic angiography after coil placement demonstrated adequate embolization of the proximal splenic artery. The catheters and sheath were removed over a guidewire and the right common femoral arteriotomy was closed with a 6 French Angio-Seal device. Patient tolerated the procedure well without immediate complication. The patient was transferred back to the emergency department after the procedure. IMPRESSION: 1. Angiographic findings compatible with splenic laceration, no evidence of pseudoaneurysm or active extravasation. 2. Technically successful coil embolization of the proximal splenic artery. [name], MD [facility]iology Electronically Signed   By: [name] MD   On: 10/25/2020 09:21   IR Angiogram Visceral Selective  Result Date: 10/25/2020 INDICATION: 85 year old  female presenting to the emergency department 1 day after colonoscopy with upper abdominal pain. CT abdomen pelvis significant for moderate to high-grade splenic laceration. EXAM: 1. Ultrasound-guided right common femoral artery access. 2. Abdominal aortogram. 3. Selective catheterization of the splenic artery. 4. Splenic arteriogram. 5. Coil embolization of the proximal splenic artery. MEDICATIONS: None. ANESTHESIA/SEDATION: Moderate (conscious) sedation was employed during this procedure. A total of Versed 2 mg and Fentanyl 100 mcg was administered intravenously. Moderate Sedation Time: 67 minutes. The patient's level of consciousness and vital signs were monitored continuously by radiology nursing throughout the procedure under my direct supervision. CONTRAST:  25m OMNIPAQUE IOHEXOL 300 MG/ML SOLN, 42mOMNIPAQUE IOHEXOL 300 MG/ML SOLN FLUOROSCOPY TIME:  Fluoroscopy Time: 19 minutes 30 seconds (284 mGy). COMPLICATIONS: None immediate. PROCEDURE: Informed consent was obtained from the patient following explanation of the procedure, risks, benefits and alternatives. The patient understands, agrees and consents for the procedure. All questions were addressed. A time out was performed prior to the initiation of the procedure. Maximal barrier sterile technique utilized including caps, mask, sterile gowns, sterile gloves, large sterile drape, hand hygiene, and chlorhexidine prep. Under direct ultrasound visualization, the right common femoral artery was accessed with a micropuncture kit. A permanent ultrasound image was recorded. Limited right lower extremity angiogram demonstrated adequate puncture site for vascular closure device use. Through the 5 French outer micropuncture sheath, a J wire was directed to the abdominal aorta. A 5 French, 11 cm vascular sheath was placed. A 5 French, C2 catheter was then advanced to the level of the celiac trunk. There was difficulty with engaging the celiac ostium. Therefore, the  C2 was exchanged  for a pigtail flush catheter. Abdominal aortic angiography was performed which demonstrated normal caliber patent aorta with moderate tortuosity related to the patient's scoliosis. The splenic artery appeared patent. Given the tortuosity and difficulty with celiac catheterization, a 35 cm 5 French sheath was inserted for stability. The 5 French C2 catheter was then able to select the proximal splenic artery. A Renegade hi Flo microcatheter and 0.016 fathom wire were then used to select the splenic artery. Splenic angiogram was performed from proximal location which demonstrated irregularity of the splenic parenchymal vessels near the hilum with a paucity of opacification in the superior and peripheral spleen, compatible with splenic laceration. No pseudoaneurysm or active extravasation was visualized. An assortment of 4 mm to 7 mm Ruby micro coils were then deployed in the proximal splenic artery. Repeat splenic angiography after coil placement demonstrated adequate embolization of the proximal splenic artery. The catheters and sheath were removed over a guidewire and the right common femoral arteriotomy was closed with a 6 French Angio-Seal device. Patient tolerated the procedure well without immediate complication. The patient was transferred back to the emergency department after the procedure. IMPRESSION: 1. Angiographic findings compatible with splenic laceration, no evidence of pseudoaneurysm or active extravasation. 2. Technically successful coil embolization of the proximal splenic artery. [name], MD [facility] Radiology Electronically Signed   By: [name] MD   On: 10/25/2020 09:21   IR US Guide Vasc Access Right  Result Date: 10/25/2020 INDICATION: 85 year old female presenting to the emergency department 1 day after colonoscopy with upper abdominal pain. CT abdomen pelvis significant for moderate to high-grade splenic  laceration. EXAM: 1. Ultrasound-guided right common femoral artery access. 2. Abdominal aortogram. 3. Selective catheterization of the splenic artery. 4. Splenic arteriogram. 5. Coil embolization of the proximal splenic artery. MEDICATIONS: None. ANESTHESIA/SEDATION: Moderate (conscious) sedation was employed during this procedure. A total of Versed 2 mg and Fentanyl 100 mcg was administered intravenously. Moderate Sedation Time: 67 minutes. The patient's level of consciousness and vital signs were monitored continuously by radiology nursing throughout the procedure under my direct supervision. CONTRAST:  76m OMNIPAQUE IOHEXOL 300 MG/ML SOLN, 446mOMNIPAQUE IOHEXOL 300 MG/ML SOLN FLUOROSCOPY TIME:  Fluoroscopy Time: 19 minutes 30 seconds (284 mGy). COMPLICATIONS: None immediate. PROCEDURE: Informed consent was obtained from the patient following explanation of the procedure, risks, benefits and alternatives. The patient understands, agrees and consents for the procedure. All questions were addressed. A time out was performed prior to the initiation of the procedure. Maximal barrier sterile technique utilized including caps, mask, sterile gowns, sterile gloves, large sterile drape, hand hygiene, and chlorhexidine prep. Under direct ultrasound visualization, the right common femoral artery was accessed with a micropuncture kit. A permanent ultrasound image was recorded. Limited right lower extremity angiogram demonstrated adequate puncture site for vascular closure device use. Through the 5 French outer micropuncture sheath, a J wire was directed to the abdominal aorta. A 5 French, 11 cm vascular sheath was placed. A 5 French, C2 catheter was then advanced to the level of the celiac trunk. There was difficulty with engaging the celiac ostium. Therefore, the C2 was exchanged for a pigtail flush catheter. Abdominal aortic angiography was performed which demonstrated normal caliber patent aorta with moderate tortuosity  related to the patient's scoliosis. The splenic artery appeared patent. Given the tortuosity and difficulty with celiac catheterization, a 35 cm 5 French sheath was inserted for stability. The 5 French C2 catheter was then able to select the proximal splenic  artery. A Renegade hi Flo microcatheter and 0.016 fathom wire were then used to select the splenic artery. Splenic angiogram was performed from proximal location which demonstrated irregularity of the splenic parenchymal vessels near the hilum with a paucity of opacification in the superior and peripheral spleen, compatible with splenic laceration. No pseudoaneurysm or active extravasation was visualized. An assortment of 4 mm to 7 mm Ruby micro coils were then deployed in the proximal splenic artery. Repeat splenic angiography after coil placement demonstrated adequate embolization of the proximal splenic artery. The catheters and sheath were removed over a guidewire and the right common femoral arteriotomy was closed with a 6 French Angio-Seal device. Patient tolerated the procedure well without immediate complication. The patient was transferred back to the emergency department after the procedure. IMPRESSION: 1. Angiographic findings compatible with splenic laceration, no evidence of pseudoaneurysm or active extravasation. 2. Technically successful coil embolization of the proximal splenic artery. [name], MD [facility] Radiology Electronically Signed   By: [name] MD   On: 10/25/2020 09:21   IR EMBO ART  VEN HEMORR LYMPH EXTRAV  INC GUIDE ROADMAPPING  Result Date: 10/25/2020 INDICATION: 85 year old female presenting to the emergency department 1 day after colonoscopy with upper abdominal pain. CT abdomen pelvis significant for moderate to high-grade splenic laceration. EXAM: 1. Ultrasound-guided right common femoral artery access. 2. Abdominal aortogram. 3. Selective catheterization of the  splenic artery. 4. Splenic arteriogram. 5. Coil embolization of the proximal splenic artery. MEDICATIONS: None. ANESTHESIA/SEDATION: Moderate (conscious) sedation was employed during this procedure. A total of Versed 2 mg and Fentanyl 100 mcg was administered intravenously. Moderate Sedation Time: 67 minutes. The patient's level of consciousness and vital signs were monitored continuously by radiology nursing throughout the procedure under my direct supervision. CONTRAST:  24m OMNIPAQUE IOHEXOL 300 MG/ML SOLN, 47mOMNIPAQUE IOHEXOL 300 MG/ML SOLN FLUOROSCOPY TIME:  Fluoroscopy Time: 19 minutes 30 seconds (284 mGy). COMPLICATIONS: None immediate. PROCEDURE: Informed consent was obtained from the patient following explanation of the procedure, risks, benefits and alternatives. The patient understands, agrees and consents for the procedure. All questions were addressed. A time out was performed prior to the initiation of the procedure. Maximal barrier sterile technique utilized including caps, mask, sterile gowns, sterile gloves, large sterile drape, hand hygiene, and chlorhexidine prep. Under direct ultrasound visualization, the right common femoral artery was accessed with a micropuncture kit. A permanent ultrasound image was recorded. Limited right lower extremity angiogram demonstrated adequate puncture site for vascular closure device use. Through the 5 French outer micropuncture sheath, a J wire was directed to the abdominal aorta. A 5 French, 11 cm vascular sheath was placed. A 5 French, C2 catheter was then advanced to the level of the celiac trunk. There was difficulty with engaging the celiac ostium. Therefore, the C2 was exchanged for a pigtail flush catheter. Abdominal aortic angiography was performed which demonstrated normal caliber patent aorta with moderate tortuosity related to the patient's scoliosis. The splenic artery appeared patent. Given the tortuosity and difficulty with celiac  catheterization, a 35 cm 5 French sheath was inserted for stability. The 5 French C2 catheter was then able to select the proximal splenic artery. A Renegade hi Flo microcatheter and 0.016 fathom wire were then used to select the splenic artery. Splenic angiogram was performed from proximal location which demonstrated irregularity of the splenic parenchymal vessels near the hilum with a paucity of opacification in the superior and peripheral spleen, compatible with splenic laceration. No pseudoaneurysm or  active extravasation was visualized. An assortment of 4 mm to 7 mm Ruby micro coils were then deployed in the proximal splenic artery. Repeat splenic angiography after coil placement demonstrated adequate embolization of the proximal splenic artery. The catheters and sheath were removed over a guidewire and the right common femoral arteriotomy was closed with a 6 French Angio-Seal device. Patient tolerated the procedure well without immediate complication. The patient was transferred back to the emergency department after the procedure. IMPRESSION: 1. Angiographic findings compatible with splenic laceration, no evidence of pseudoaneurysm or active extravasation. 2. Technically successful coil embolization of the proximal splenic artery. [name], MD [facility] Radiology Electronically Signed   By: [name] MD   On: 10/25/2020 09:21    Anti-infectives: Anti-infectives (From admission, onward)   None       Assessment/Plan Grade 3 splenic laceration s/p colonoscopy  -s/p IR embolization of her spleen last night. -hgb down from 12 to 10.3 today after a couple of units of pRBCs overnight, but patient is very stable this morning.  Off pressors. -d/w CCM.  Patient can likely come out of the unit today.  Would recommend slow mobilization.  She can get up to a chair today, but would monitor hgb stabilization for at least another day prior to full  mobilization. -she is on a regular diet, will monitor that as she may get an ileus from her embolization but from her moderate amount of hemoperitoneum as well. -patient may have some prolonged abdominal pain secondary to embolization as it is painful to go through the ischemic process with this. 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  She will also then need the second pneumococcal 23 vaccine by her PCP.   FEN - regular diet VTE - on hold ID - none currently   LOS: 1 day    Tara Bridges , [facility] 10/25/2020, 10:25 AM Please see Amion for pager number during day hours 7:00am-4:30pm or 7:00am -11:30am on weekends

## 2020-10-25 NOTE — Progress Notes (Signed)
   Patient Name: TEKA CHANDA Date of Encounter: 10/25/2020, 8:15 AM    Subjective  Taking Tylenol for pain Still on bedrest Rested well last night In good spirits Daughter in room  Objective  BP 97/77   Pulse 62   Temp 98 F (36.7 C) (Oral)   Resp (!) 21   Ht 5\' 6"  (1.676 m)   Wt 65.4 kg   SpO2 95%   BMI 23.27 kg/m  abd mild-mod distended Soft Mildly tender LUQ    Assessment and Plan  Iatrogenic Gr 3 splenic laceration from colonoscopy S/p IR embolization  She looks good at this point will get labs  Looks like she can go to floor - Dr. Lamonte Sakai to see d/w him  I have ordred f/u TFT's as was overtx and we are adjusting meds   I very much appreciate all of my colleagues assistance  Please contact me by my cell below if ?'s arise - Dr. Fuller Plan on for [place] but I am available by cell [phone] We will defer care to IR/GSU and TRH and be available if GI ? Arise (do anticipate some ileus)   I will be arranging GI f/u after dc  Gatha Mayer, MD, [facility] 10/25/2020 8:15 AM [phone] cell

## 2020-10-25 NOTE — Progress Notes (Signed)
Daughter, Noland Fordyce., at bedside and notified of patient's transfer to [phone]. Patient's personal belongings (glasses and book) taken with patient during transfer.

## 2020-10-26 DIAGNOSIS — R578 Other shock: Secondary | ICD-10-CM

## 2020-10-26 LAB — CBC
HCT: 30.5 % — ABNORMAL LOW (ref 36.0–46.0)
Hemoglobin: 10.1 g/dL — ABNORMAL LOW (ref 12.0–15.0)
MCH: 30.1 pg (ref 26.0–34.0)
MCHC: 33.1 g/dL (ref 30.0–36.0)
MCV: 91 fL (ref 80.0–100.0)
Platelets: 179 10*3/uL (ref 150–400)
RBC: 3.35 MIL/uL — ABNORMAL LOW (ref 3.87–5.11)
RDW: 16 % — ABNORMAL HIGH (ref 11.5–15.5)
WBC: 10 10*3/uL (ref 4.0–10.5)
nRBC: 0 % (ref 0.0–0.2)

## 2020-10-26 LAB — BASIC METABOLIC PANEL
Anion gap: 8 (ref 5–15)
BUN: 12 mg/dL (ref 8–23)
CO2: 22 mmol/L (ref 22–32)
Calcium: 8.3 mg/dL — ABNORMAL LOW (ref 8.9–10.3)
Chloride: 102 mmol/L (ref 98–111)
Creatinine, Ser: 0.87 mg/dL (ref 0.44–1.00)
GFR, Estimated: >60 mL/min (ref >60)
Glucose, Bld: 108 mg/dL — ABNORMAL HIGH (ref 70–99)
Potassium: 3.8 mmol/L (ref 3.5–5.1)
Sodium: 132 mmol/L — ABNORMAL LOW (ref 135–145)

## 2020-10-26 LAB — T3, FREE: T3, Free: 1.2 pg/mL — ABNORMAL LOW (ref 2.0–4.4)

## 2020-10-26 MED ORDER — HYDROMORPHONE HCL 1 MG/ML IJ SOLN: 0.5000 mg | INTRAMUSCULAR | Status: DC | PRN

## 2020-10-26 MED ORDER — HAEMOPHILUS B POLYSAC CONJ VAC IM SOLR
0.5000 mL | INTRAMUSCULAR | Status: DC | PRN
  Filled 2020-10-26: qty 0.5

## 2020-10-26 MED ORDER — MENINGOCOCCAL A C Y&W-135 OLIG IM SOLR
0.5000 mL | Freq: Once | INTRAMUSCULAR | Status: AC
  Administered 2020-10-26: 0.5 mL via INTRAMUSCULAR
  Filled 2020-10-26: qty 0.5

## 2020-10-26 MED ORDER — ONDANSETRON HCL 4 MG/2ML IJ SOLN
4.0000 mg | Freq: Four times a day (QID) | INTRAMUSCULAR | Status: DC | PRN
  Administered 2020-10-26: 4 mg via INTRAVENOUS
  Filled 2020-10-26: qty 2

## 2020-10-26 MED ORDER — PNEUMOCOCCAL 13-VAL CONJ VACC IM SUSP
0.5000 mL | INTRAMUSCULAR | Status: AC | PRN
  Administered 2020-10-27: 0.5 mL via INTRAMUSCULAR
  Filled 2020-10-26 (×3): qty 0.5

## 2020-10-26 MED ORDER — ACETAMINOPHEN-CODEINE #3 300-30 MG PO TABS
1.0000 | ORAL_TABLET | Freq: Every evening | ORAL | Status: DC | PRN
  Administered 2020-10-27: 1 via ORAL
  Filled 2020-10-26: qty 1

## 2020-10-26 MED ORDER — INFLUENZA VAC SPLIT QUAD 0.5 ML IM SUSY: 0.5000 mL | PREFILLED_SYRINGE | INTRAMUSCULAR | Status: DC | PRN

## 2020-10-26 MED ORDER — METHOCARBAMOL 500 MG PO TABS: 1000.0000 mg | ORAL_TABLET | Freq: Four times a day (QID) | ORAL | Status: DC | PRN

## 2020-10-26 MED ORDER — ACETAMINOPHEN 500 MG PO TABS
500.0000 mg | ORAL_TABLET | Freq: Four times a day (QID) | ORAL | Status: DC
  Administered 2020-10-26 – 2020-10-27 (×3): 500 mg via ORAL
  Filled 2020-10-26 (×3): qty 1

## 2020-10-26 MED ORDER — HAEMOPHILUS B POLYSAC CONJ VAC IM SOLR
0.5000 mL | INTRAMUSCULAR | Status: AC | PRN
  Administered 2020-10-27: 0.5 mL via INTRAMUSCULAR
  Filled 2020-10-26 (×2): qty 0.5

## 2020-10-26 MED ORDER — SENNOSIDES-DOCUSATE SODIUM 8.6-50 MG PO TABS
1.0000 | ORAL_TABLET | Freq: Two times a day (BID) | ORAL | Status: DC
  Administered 2020-10-26 – 2020-10-27 (×3): 1 via ORAL
  Filled 2020-10-26 (×3): qty 1

## 2020-10-26 MED ORDER — ACETAMINOPHEN 500 MG PO TABS
1000.0000 mg | ORAL_TABLET | Freq: Four times a day (QID) | ORAL | Status: DC
  Administered 2020-10-26 (×2): 1000 mg via ORAL
  Filled 2020-10-26 (×2): qty 2

## 2020-10-26 MED ORDER — METHOCARBAMOL 1000 MG/10ML IJ SOLN
1000.0000 mg | Freq: Four times a day (QID) | INTRAVENOUS | Status: DC | PRN
  Filled 2020-10-26: qty 10

## 2020-10-26 MED ORDER — METHOCARBAMOL 500 MG PO TABS
1000.0000 mg | ORAL_TABLET | Freq: Four times a day (QID) | ORAL | Status: DC
  Administered 2020-10-26 – 2020-10-27 (×4): 1000 mg via ORAL
  Filled 2020-10-26 (×4): qty 2

## 2020-10-26 MED ORDER — HYDROCODONE-ACETAMINOPHEN 5-325 MG PO TABS: 1.0000 | ORAL_TABLET | Freq: Four times a day (QID) | ORAL | Status: DC | PRN

## 2020-10-26 NOTE — Discharge Instructions (Signed)
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  Your primary care provider should be able to provide this.  If not, they should be able to direct you to where you can, such as the health department.  No heavy lifting over 20 lbs or "contact sports" for 6 weeks after spleen injury.   Managing Pain  ######################################################################   CONTROL PAIN Control pain so that you can walk, sleep, tolerate sneezing/coughing, go up/down stairs.  (Good pain control is not pain free only when lying still, unable to move)  WALK Walk an hour a day.  Control your pain to do that.   HAVE A BOWEL MOVEMENT DAILY Keep your bowels regular to avoid problems.  OK to try a laxative to override constipation.  OK to use an antidairrheal to slow down diarrhea.  Call if not better after 2 tries  CALL IF YOU HAVE PROBLEMS/CONCERNS Call if you are still struggling despite following these instructions. Call if you have concerns not answered by these instructions  ######################################################################   Pain after surgery or related to activity is often due to strain/injury to muscle, tendon, nerves and/or incisions.  This pain is usually short-term and will improve in a few months.   Many people find it helpful to do the following things TOGETHER to help speed the process of healing and to get back to regular activity more quickly:  1. Avoid heavy physical activity at first a. No lifting greater than 20 pounds at first, then increase to lifting as tolerated over the next few weeks b. Do not push through the pain.  Listen to your body and avoid positions and maneuvers than reproduce the pain.  Wait a few days before trying something more intense c. Walking is okay as tolerated, but go slowly and stop when getting sore.  If you can walk 30 minutes  without stopping or pain, you can try more intense activity (running, jogging, aerobics, cycling, swimming, treadmill, sex, sports, weightlifting, etc ) d. Remember: If it hurts to do it, then dont do it!  2. Take Acetaminophen Anti-inflammatory medication i. Acetaminophen 500mg  tabs (Tylenol) 1-2 pills with every meal and just before bedtime (avoid if you have liver problems) ii. Take with food/snack around the clock for 1-2 weeks iii. This helps the muscle and nerve tissues become less irritable and calm down faster  3. Use a Heating pad or Ice/Cold Pack a. 4-6 times a day b. May use warm bath/hottub  or showers  4. Try Gentle Massage and/or Stretching  a. at the area of pain many times a day b. stop if you feel pain - do not overdo it  Try these steps together to help you body heal faster and avoid making things get worse.  Doing just one of these things may not be enough.    If you are not getting better after two weeks or are noticing you are getting worse, contact our office for further advice; we may need to re-evaluate you & see what other things we can do to help.

## 2020-10-26 NOTE — Evaluation (Signed)
Physical Therapy Evaluation Patient Details Name: Tara Bridges MRN: [id number] DOB: Sep 02, 1936 Today's Date: 10/26/2020   History of Present Illness  Pt is an 85 y/o female with PMH of hypothyroidism and hypertension underwent an EGD, colonoscopy in 2/17 for weight loss, early satiety, semisolid stools and incontinence. She was subsequently admitted on 2/18 with severe abdominal pain in the ED and was noted to be orthostatic hypotensive. She received 1 unit of PRBC. CT of the abdomen and pelvis noted a grade 3 splenic laceration.    Clinical Impression  Pt presented supine in bed with HOB elevated, awake and willing to participate in therapy session. Pt's daughter was present throughout session as well. Prior to admission, pt reported that she was independent with all functional mobility and ADLs. She has a small dog "MJ" and enjoys walking up to 15 miles per week. Pt lives alone in a two level house with three steps to enter. She reported that she would have family/friends with her 24/7 to assist as needed upon discharge. At the time of evaluation, pt overall moving very well. She tolerated ambulating a lap around the entire unit without use of an AD with supervision from PT for safety. She had no LOB throughout. She declined stair training at this time; however, nothing indicating that she would have any problems ascending and descending stairs with handrails. PT discussed the importance of moving and changing positions every hour once she returns home. Pt and pt's daughter expressed understanding. Based on pt's current functional mobility status and great family support, PT recommending pt d/c home with no PT follow-up services. No further acute PT needs identified at this time. PT signing off.     Follow Up Recommendations No PT follow up    Equipment Recommendations  None recommended by PT    Recommendations for Other Services       Precautions / Restrictions Precautions Precautions:  Fall Restrictions Weight Bearing Restrictions: No      Mobility  Bed Mobility Overal bed mobility: Modified Independent             General bed mobility comments: HOB elevated    Transfers Overall transfer level: Needs assistance Equipment used: None Transfers: Sit to/from Stand Sit to Stand: Supervision         General transfer comment: no instability or physical assistance needed  Ambulation/Gait Ambulation/Gait assistance: Supervision Gait Distance (Feet): 500 Feet Assistive device: None Gait Pattern/deviations: Step-through pattern;Decreased stride length Gait velocity: decreased   General Gait Details: pt with cautious but steady gait without needing an AD or external support, supervision for safety with no balance deficits noted  Stairs            Wheelchair Mobility    Modified Rankin (Stroke Patients Only)       Balance Overall balance assessment: No apparent balance deficits (not formally assessed)                                           Pertinent Vitals/Pain Pain Assessment: Faces Faces Pain Scale: Hurts a little bit Pain Location: abdomen Pain Descriptors / Indicators: Guarding Pain Intervention(s): Monitored during session    Home Living Family/patient expects to be discharged to:: Private residence Living Arrangements: Alone Available Help at Discharge: Family;Available 24 hours/day Type of Home: House Home Access: Stairs to enter Entrance Stairs-Rails: Right;Left;Can reach both Entrance Stairs-Number of Steps: 3  Home Layout: Two level;1/2 bath on main level Home Equipment: None      Prior Function Level of Independence: Independent         Comments: has a toy poodle and enjoys walking up to 15 miles per week     Hand Dominance        Extremity/Trunk Assessment   Upper Extremity Assessment Upper Extremity Assessment: Overall WFL for tasks assessed    Lower Extremity Assessment Lower  Extremity Assessment: Overall WFL for tasks assessed    Cervical / Trunk Assessment Cervical / Trunk Assessment: Normal  Communication   Communication: No difficulties  Cognition Arousal/Alertness: Awake/alert Behavior During Therapy: WFL for tasks assessed/performed Overall Cognitive Status: Within Functional Limits for tasks assessed                                        General Comments      Exercises     Assessment/Plan    PT Assessment Patent does not need any further PT services  PT Problem List         PT Treatment Interventions      PT Goals (Current goals can be found in the Care Plan section)  Acute Rehab PT Goals Patient Stated Goal: "home tomorrow"    Frequency     Barriers to discharge        Co-evaluation               AM-PAC PT "6 Clicks" Mobility  Outcome Measure Help needed turning from your back to your side while in a flat bed without using bedrails?: None Help needed moving from lying on your back to sitting on the side of a flat bed without using bedrails?: None Help needed moving to and from a bed to a chair (including a wheelchair)?: None Help needed standing up from a chair using your arms (e.g., wheelchair or bedside chair)?: None Help needed to walk in hospital room?: None Help needed climbing 3-5 steps with a railing? : A Little 6 Click Score: 23    End of Session   Activity Tolerance: Patient tolerated treatment well Patient left: in bed;with call bell/phone within reach;with family/visitor present Nurse Communication: Mobility status PT Visit Diagnosis: Pain Pain - part of body:  (abdomen)    Time: 3086-5784 PT Time Calculation (min) (ACUTE ONLY): 18 min   Charges:   PT Evaluation $PT Eval Low Complexity: 1 Low          Eduard Clos, PT, DPT  Acute Rehabilitation Services Pager [phone] Office [place] 10/26/2020, 2:39 PM

## 2020-10-26 NOTE — Progress Notes (Addendum)
PROGRESS NOTE    Tara Bridges  RDE:[id number] DOB: Jan 17, 1936 DOA: 10/24/2020 PCP: Reynold Bowen, MD  Brief Narrative: 84/F with past history of hypothyroidism and hypertension underwent an EGD, colonoscopy in 2/17 for weight loss, Ailey early satiety, semisolid stools and incontinence.  Subsequently was admitted on 2/18 with severe abdominal pain in the ED she was noted to be orthostatic hypotensive, transfuse 1 unit of PRBC, has had a CT abdomen pelvis which noted a grade 3 splenic laceration. -Transfused 1 unit of PRBC, seen by IR and GI, underwent proximal splenic artery embolization on 2/18 -Transferred from PCCM to [facility] service 2/20   Assessment & Plan:   Splenic laceration -Stable and improved, transfuse 1 unit of PRBC on admission and underwent proximal splenic artery embolization on 2/18 in IR -Stable now, mild discomfort reported -Hemoglobin is stable -Ambulate, PT OT -CBC in a.m., discharge planning -post splenectomy vaccines prior to DC  Weight loss, early satiety, loose/semisolid stools -Etiology is unclear, EGD and colonoscopy unremarkable -CT chest abdomen pelvis with contrast on admission were benign as well -TSH is within normal limits -Could be secondary to malabsorption, continue probiotics and Benefiber -Follow-up with gastroenterology  Hypertension -Losartan on hold, blood pressure stable  DVT prophylaxis: SCDs Code Status: Full code Family Communication: Daughter at bedside Disposition Plan:  Status is: Inpatient  Remains inpatient appropriate because:Ongoing active pain requiring inpatient pain management   Dispo: The patient is from: Home              Anticipated d/c is to: Home              Anticipated d/c date is: 1 day              Patient currently is not medically stable to d/c.   Difficult to place patient No   Consultants:   IR, general surgery, GI   Procedures: Proximal splenic artery coil embolization by Dr. [name] on  2/18  Antimicrobials:    Subjective: -Reports mild left-sided abdominal pain, some nausea, poor appetite, no bowel movement  Objective: Vitals:   10/25/20 1400 10/25/20 1525 10/25/20 2108 10/26/20 0320  BP: (!) 126/51 (!) 147/56 125/65 136/65  Pulse: 66 (!) 55 76 76  Resp: (!) 21 19 17 17   Temp:  98.6 F (37 C) 98.2 F (36.8 C) 97.6 F (36.4 C)  TempSrc:  Oral    SpO2: 93% 96% 90% 100%  Weight:      Height:        Intake/Output Summary (Last 24 hours) at 10/26/2020 1051 Last data filed at 10/26/2020 0400 Gross per 24 hour  Intake 600 ml  Output -  Net 600 ml   Filed Weights   10/24/20 1447 10/24/20 1518 10/25/20 0500  Weight: 61.9 kg 59 kg 65.4 kg    Examination:  General exam: Pleasant elderly female appears much younger than stated age, AAOx3, no distress HEENT: No JVD CVS: S1-S2, regular rate rhythm Lungs: Distant breath sounds the bases Abdomen: Soft, mildly distended, mild left-sided tenderness, bowel sounds present Extremities: No edema Data Reviewed:   CBC: Recent Labs  Lab 10/24/20 1515 10/25/20 0855 10/26/20 0116  WBC 13.2* 7.6 10.0  NEUTROABS 10.2*  --   --   HGB 12.2 10.3* 10.1*  HCT 37.7 31.1* 30.5*  MCV 96.9 91.7 91.0  PLT 281 189 631   Basic Metabolic Panel: Recent Labs  Lab 10/24/20 1515 10/25/20 0855 10/26/20 0116  NA 131* 134* 132*  K 4.0 4.4 3.8  CL 98 105 102  CO2 21* 21* 22  GLUCOSE 120* 85 108*  BUN 17 12 12   CREATININE 1.04* 0.98 0.87  CALCIUM 9.0 8.2* 8.3*   GFR: Estimated Creatinine Clearance: 46.7 mL/min (by C-G formula based on SCr of 0.87 mg/dL). Liver Function Tests: Recent Labs  Lab 10/24/20 1515 10/25/20 0855  AST 18 34  ALT 12 27  ALKPHOS 57 46  BILITOT 0.9 0.9  PROT 6.4* 5.3*  ALBUMIN 3.8 3.0*   Recent Labs  Lab 10/24/20 1515  LIPASE 27   No results for input(s): AMMONIA in the last 168 hours. Coagulation Profile: No results for input(s): INR, PROTIME in the last 168 hours. Cardiac  Enzymes: No results for input(s): CKTOTAL, CKMB, CKMBINDEX, TROPONINI in the last 168 hours. BNP (last 3 results) No results for input(s): PROBNP in the last 8760 hours. HbA1C: No results for input(s): HGBA1C in the last 72 hours. CBG: Recent Labs  Lab 10/24/20 2256 10/25/20 0730  GLUCAP 116* 76   Lipid Profile: No results for input(s): CHOL, HDL, LDLCALC, TRIG, CHOLHDL, LDLDIRECT in the last 72 hours. Thyroid Function Tests: Recent Labs    10/25/20 0855  TSH 3.377  FREET4 0.95   Anemia Panel: No results for input(s): VITAMINB12, FOLATE, FERRITIN, TIBC, IRON, RETICCTPCT in the last 72 hours. Urine analysis:    Component Value Date/Time   COLORURINE YELLOW 10/24/2020 1935   APPEARANCEUR CLEAR 10/24/2020 1935   LABSPEC 1.010 10/24/2020 1935   PHURINE 5.0 10/24/2020 1935   GLUCOSEU NEGATIVE 10/24/2020 1935   HGBUR NEGATIVE 10/24/2020 [place] NEGATIVE 10/24/2020 1935   KETONESUR 20 (A) 10/24/2020 1935   PROTEINUR NEGATIVE 10/24/2020 1935   NITRITE NEGATIVE 10/24/2020 1935   LEUKOCYTESUR TRACE (A) 10/24/2020 1935   Sepsis Labs: @LABRCNTIP (procalcitonin:4,lacticidven:4)  ) Recent Results (from the past 240 hour(s))  Resp Panel by RT-PCR (Flu A&B, Covid) Nasopharyngeal Swab     Status: None   Collection Time: 10/24/20  6:52 PM   Specimen: Nasopharyngeal Swab; Nasopharyngeal(NP) swabs in vial transport medium  Result Value Ref Range Status   SARS Coronavirus 2 by RT PCR NEGATIVE NEGATIVE Final    Comment: (NOTE) SARS-CoV-2 target nucleic acids are NOT DETECTED.  The SARS-CoV-2 RNA is generally detectable in upper respiratory specimens during the acute phase of infection. The lowest concentration of SARS-CoV-2 viral copies this assay can detect is 138 copies/mL. A negative result does not preclude SARS-Cov-2 infection and should not be used as the sole basis for treatment or other patient management decisions. A negative result may occur with  improper  specimen collection/handling, submission of specimen other than nasopharyngeal swab, presence of viral mutation(s) within the areas targeted by this assay, and inadequate number of viral copies(<138 copies/mL). A negative result must be combined with clinical observations, patient history, and epidemiological information. The expected result is Negative.  Fact Sheet for Patients:  EntrepreneurPulse.com.au  Fact Sheet for Healthcare Providers:  IncredibleEmployment.be  This test is no t yet approved or cleared by the [place] FDA and  has been authorized for detection and/or diagnosis of SARS-CoV-2 by FDA under an Emergency Use Authorization (EUA). This EUA will remain  in effect (meaning this test can be used) for the duration of the COVID-19 declaration under Section 564(b)(1) of the Act, 21 U.S.C.section 360bbb-3(b)(1), unless the authorization is terminated  or revoked sooner.       Influenza A by PCR NEGATIVE NEGATIVE Final   Influenza B by PCR NEGATIVE NEGATIVE Final  Comment: (NOTE) The Xpert Xpress SARS-CoV-2/FLU/RSV plus assay is intended as an aid in the diagnosis of influenza from Nasopharyngeal swab specimens and should not be used as a sole basis for treatment. Nasal washings and aspirates are unacceptable for Xpert Xpress SARS-CoV-2/FLU/RSV testing.  Fact Sheet for Patients: EntrepreneurPulse.com.au  Fact Sheet for Healthcare Providers: IncredibleEmployment.be  This test is not yet approved or cleared by the [place] FDA and has been authorized for detection and/or diagnosis of SARS-CoV-2 by FDA under an Emergency Use Authorization (EUA). This EUA will remain in effect (meaning this test can be used) for the duration of the COVID-19 declaration under Section 564(b)(1) of the Act, 21 U.S.C. section 360bbb-3(b)(1), unless the authorization is terminated or revoked.  Performed at  [facility], [place] [address] [place], [place] [address]   MRSA PCR Screening     Status: Abnormal   Collection Time: 10/24/20 11:05 PM   Specimen: Nasal Mucosa; Nasopharyngeal  Result Value Ref Range Status   MRSA by PCR POSITIVE (A) NEGATIVE Final    Comment:        The GeneXpert MRSA Assay (FDA approved for NASAL specimens only), is one component of a comprehensive MRSA colonization surveillance program. It is not intended to diagnose MRSA infection nor to guide or monitor treatment for MRSA infections. RESULT CALLED TO, READ BACK BY AND VERIFIED WITH: M HIGH RN 10/25/20 0553 JDW Performed at [facility], [address], [place], [place] [address]          Radiology Studies: DG Chest 2 View  Result Date: 10/24/2020 CLINICAL DATA:  Shortness of breath EXAM: CHEST - 2 VIEW COMPARISON:  Chest CT October 21, 2017. FINDINGS: There is mild atelectatic change in the left base. The lungs elsewhere are clear. The heart size and pulmonary vascularity are normal. No adenopathy. No bone lesions. IMPRESSION: Mild left base atelectasis. Lungs elsewhere clear. Cardiac silhouette within normal limits. Electronically Signed   By: Lowella Grip III M.D.   On: 10/24/2020 15:15   CT Angio Chest PE W and/or Wo Contrast  Result Date: 10/24/2020 CLINICAL DATA:  Onset shortness of breath and abdominal pain today. Status post EGD and colonoscopy yesterday. EXAM: CT ANGIOGRAPHY CHEST WITH CONTRAST TECHNIQUE: Multidetector CT imaging of the chest was performed using the standard protocol during bolus administration of intravenous contrast. Multiplanar CT image reconstructions and MIPs were obtained to evaluate the vascular anatomy. CONTRAST:  100 mL OMNIPAQUE IOHEXOL 350 MG/ML SOLN COMPARISON:  PA and lateral chest earlier today. FINDINGS: Cardiovascular: Satisfactory opacification of the pulmonary arteries to the segmental level. No evidence of pulmonary embolism. Normal  heart size. No pericardial effusion. Mediastinum/Nodes: No enlarged mediastinal, hilar, or axillary lymph nodes. Thyroid gland, trachea, and esophagus demonstrate no significant findings. Lungs/Pleura: Lungs demonstrate mild dependent atelectasis. Trace pleural effusions. Upper Abdomen: See report of dedicated abdomen and pelvis CT this same day. Musculoskeletal: No acute abnormality. Review of the MIP images confirms the above findings. IMPRESSION: Negative for pulmonary embolus. Trace bilateral pleural effusions and mild dependent atelectasis. Electronically Signed   By: Inge Rise M.D.   On: 10/24/2020 17:52   CT ABDOMEN PELVIS W CONTRAST  Result Date: 10/24/2020 CLINICAL DATA:  Onset shortness of breath and abdominal pain today. EXAM: CT ABDOMEN AND PELVIS WITH CONTRAST TECHNIQUE: Multidetector CT imaging of the abdomen and pelvis was performed using the standard protocol following bolus administration of intravenous contrast. CONTRAST:  100 mL OMNIPAQUE IOHEXOL 350 MG/ML SOLN COMPARISON:  None.  FINDINGS: Lower chest: See report of dedicated chest CT today. Hepatobiliary: Scattered cysts are seen in the liver. No worrisome lesion. Biliary tree and gallbladder are negative. Pancreas: Unremarkable. No pancreatic ductal dilatation or surrounding inflammatory changes. Spleen: The patient has a large spleen laceration with surrounding hematoma. There is a small volume of active extravasation contrast as best seen on images 20-23 of series 2. Adrenals/Urinary Tract: Adrenal glands are unremarkable. Kidneys are normal, without renal calculi, focal lesion, or hydronephrosis. Bladder is unremarkable. Stomach/Bowel: Stomach is within normal limits. Appendix appears normal. No evidence of bowel wall thickening, distention, or inflammatory changes. Vascular/Lymphatic: Aortic atherosclerosis. No enlarged abdominal or pelvic lymph nodes. Reproductive: Uterus and bilateral adnexa are unremarkable. Other: Scattered  abdominal and pelvic fluid and hemorrhage are identified. Musculoskeletal: No acute or focal abnormality. Scoliosis and multilevel lumbar degenerative change noted. IMPRESSION: Large splenic laceration with a small volume active extravasation of contrast material consistent with least a grade 3 injury. Recommend consultation with surgery or interventional radiology. No other acute abnormality. Critical Value/emergent results were called by telephone at the time of interpretation on 10/24/2020 at 5:45 pm to provider ADAM CURATOLO , who verbally acknowledged these results. Electronically Signed   By: Inge Rise M.D.   On: 10/24/2020 17:46   IR Aortagram Abdominal Serialogram  Result Date: 10/25/2020 INDICATION: 85 year old female presenting to the emergency department 1 day after colonoscopy with upper abdominal pain. CT abdomen pelvis significant for moderate to high-grade splenic laceration. EXAM: 1. Ultrasound-guided right common femoral artery access. 2. Abdominal aortogram. 3. Selective catheterization of the splenic artery. 4. Splenic arteriogram. 5. Coil embolization of the proximal splenic artery. MEDICATIONS: None. ANESTHESIA/SEDATION: Moderate (conscious) sedation was employed during this procedure. A total of Versed 2 mg and Fentanyl 100 mcg was administered intravenously. Moderate Sedation Time: 67 minutes. The patient's level of consciousness and vital signs were monitored continuously by radiology nursing throughout the procedure under my direct supervision. CONTRAST:  74mL OMNIPAQUE IOHEXOL 300 MG/ML SOLN, 31mL OMNIPAQUE IOHEXOL 300 MG/ML SOLN FLUOROSCOPY TIME:  Fluoroscopy Time: 19 minutes 30 seconds (284 mGy). COMPLICATIONS: None immediate. PROCEDURE: Informed consent was obtained from the patient following explanation of the procedure, risks, benefits and alternatives. The patient understands, agrees and consents for the procedure. All questions were addressed. A time out was performed prior  to the initiation of the procedure. Maximal barrier sterile technique utilized including caps, mask, sterile gowns, sterile gloves, large sterile drape, hand hygiene, and chlorhexidine prep. Under direct ultrasound visualization, the right common femoral artery was accessed with a micropuncture kit. A permanent ultrasound image was recorded. Limited right lower extremity angiogram demonstrated adequate puncture site for vascular closure device use. Through the 5 French outer micropuncture sheath, a J wire was directed to the abdominal aorta. A 5 French, 11 cm vascular sheath was placed. A 5 French, C2 catheter was then advanced to the level of the celiac trunk. There was difficulty with engaging the celiac ostium. Therefore, the C2 was exchanged for a pigtail flush catheter. Abdominal aortic angiography was performed which demonstrated normal caliber patent aorta with moderate tortuosity related to the patient's scoliosis. The splenic artery appeared patent. Given the tortuosity and difficulty with celiac catheterization, a 35 cm 5 French sheath was inserted for stability. The 5 French C2 catheter was then able to select the proximal splenic artery. A Renegade hi Flo microcatheter and 0.016 fathom wire were then used to select the splenic artery. Splenic angiogram was performed from proximal location which demonstrated irregularity of  the splenic parenchymal vessels near the hilum with a paucity of opacification in the superior and peripheral spleen, compatible with splenic laceration. No pseudoaneurysm or active extravasation was visualized. An assortment of 4 mm to 7 mm Ruby micro coils were then deployed in the proximal splenic artery. Repeat splenic angiography after coil placement demonstrated adequate embolization of the proximal splenic artery. The catheters and sheath were removed over a guidewire and the right common femoral arteriotomy was closed with a 6 French Angio-Seal device. Patient tolerated the  procedure well without immediate complication. The patient was transferred back to the emergency department after the procedure. IMPRESSION: 1. Angiographic findings compatible with splenic laceration, no evidence of pseudoaneurysm or active extravasation. 2. Technically successful coil embolization of the proximal splenic artery. [name], MD [facility] Electronically Signed   By: [name] MD   On: 10/25/2020 09:21   IR Angiogram Visceral Selective  Result Date: 10/25/2020 INDICATION: 85 year old female presenting to the emergency department 1 day after colonoscopy with upper abdominal pain. CT abdomen pelvis significant for moderate to high-grade splenic laceration. EXAM: 1. Ultrasound-guided right common femoral artery access. 2. Abdominal aortogram. 3. Selective catheterization of the splenic artery. 4. Splenic arteriogram. 5. Coil embolization of the proximal splenic artery. MEDICATIONS: None. ANESTHESIA/SEDATION: Moderate (conscious) sedation was employed during this procedure. A total of Versed 2 mg and Fentanyl 100 mcg was administered intravenously. Moderate Sedation Time: 67 minutes. The patient's level of consciousness and vital signs were monitored continuously by radiology nursing throughout the procedure under my direct supervision. CONTRAST:  68mL OMNIPAQUE IOHEXOL 300 MG/ML SOLN, 38mL OMNIPAQUE IOHEXOL 300 MG/ML SOLN FLUOROSCOPY TIME:  Fluoroscopy Time: 19 minutes 30 seconds (284 mGy). COMPLICATIONS: None immediate. PROCEDURE: Informed consent was obtained from the patient following explanation of the procedure, risks, benefits and alternatives. The patient understands, agrees and consents for the procedure. All questions were addressed. A time out was performed prior to the initiation of the procedure. Maximal barrier sterile technique utilized including caps, mask, sterile gowns, sterile gloves, large sterile drape, hand  hygiene, and chlorhexidine prep. Under direct ultrasound visualization, the right common femoral artery was accessed with a micropuncture kit. A permanent ultrasound image was recorded. Limited right lower extremity angiogram demonstrated adequate puncture site for vascular closure device use. Through the 5 French outer micropuncture sheath, a J wire was directed to the abdominal aorta. A 5 French, 11 cm vascular sheath was placed. A 5 French, C2 catheter was then advanced to the level of the celiac trunk. There was difficulty with engaging the celiac ostium. Therefore, the C2 was exchanged for a pigtail flush catheter. Abdominal aortic angiography was performed which demonstrated normal caliber patent aorta with moderate tortuosity related to the patient's scoliosis. The splenic artery appeared patent. Given the tortuosity and difficulty with celiac catheterization, a 35 cm 5 French sheath was inserted for stability. The 5 French C2 catheter was then able to select the proximal splenic artery. A Renegade hi Flo microcatheter and 0.016 fathom wire were then used to select the splenic artery. Splenic angiogram was performed from proximal location which demonstrated irregularity of the splenic parenchymal vessels near the hilum with a paucity of opacification in the superior and peripheral spleen, compatible with splenic laceration. No pseudoaneurysm or active extravasation was visualized. An assortment of 4 mm to 7 mm Ruby micro coils were then deployed in the proximal splenic artery. Repeat splenic angiography after coil placement demonstrated adequate embolization of the proximal splenic artery.  The catheters and sheath were removed over a guidewire and the right common femoral arteriotomy was closed with a 6 French Angio-Seal device. Patient tolerated the procedure well without immediate complication. The patient was transferred back to the emergency department after the procedure. IMPRESSION: 1. Angiographic  findings compatible with splenic laceration, no evidence of pseudoaneurysm or active extravasation. 2. Technically successful coil embolization of the proximal splenic artery. [name], MD [facility] Electronically Signed   By: [name] MD   On: 10/25/2020 09:21   IR US Guide Vasc Access Right  Result Date: 10/25/2020 INDICATION: 85 year old female presenting to the emergency department 1 day after colonoscopy with upper abdominal pain. CT abdomen pelvis significant for moderate to high-grade splenic laceration. EXAM: 1. Ultrasound-guided right common femoral artery access. 2. Abdominal aortogram. 3. Selective catheterization of the splenic artery. 4. Splenic arteriogram. 5. Coil embolization of the proximal splenic artery. MEDICATIONS: None. ANESTHESIA/SEDATION: Moderate (conscious) sedation was employed during this procedure. A total of Versed 2 mg and Fentanyl 100 mcg was administered intravenously. Moderate Sedation Time: 67 minutes. The patient's level of consciousness and vital signs were monitored continuously by radiology nursing throughout the procedure under my direct supervision. CONTRAST:  39mL OMNIPAQUE IOHEXOL 300 MG/ML SOLN, 1mL OMNIPAQUE IOHEXOL 300 MG/ML SOLN FLUOROSCOPY TIME:  Fluoroscopy Time: 19 minutes 30 seconds (284 mGy). COMPLICATIONS: None immediate. PROCEDURE: Informed consent was obtained from the patient following explanation of the procedure, risks, benefits and alternatives. The patient understands, agrees and consents for the procedure. All questions were addressed. A time out was performed prior to the initiation of the procedure. Maximal barrier sterile technique utilized including caps, mask, sterile gowns, sterile gloves, large sterile drape, hand hygiene, and chlorhexidine prep. Under direct ultrasound visualization, the right common femoral artery was accessed with a micropuncture kit. A permanent  ultrasound image was recorded. Limited right lower extremity angiogram demonstrated adequate puncture site for vascular closure device use. Through the 5 French outer micropuncture sheath, a J wire was directed to the abdominal aorta. A 5 French, 11 cm vascular sheath was placed. A 5 French, C2 catheter was then advanced to the level of the celiac trunk. There was difficulty with engaging the celiac ostium. Therefore, the C2 was exchanged for a pigtail flush catheter. Abdominal aortic angiography was performed which demonstrated normal caliber patent aorta with moderate tortuosity related to the patient's scoliosis. The splenic artery appeared patent. Given the tortuosity and difficulty with celiac catheterization, a 35 cm 5 French sheath was inserted for stability. The 5 French C2 catheter was then able to select the proximal splenic artery. A Renegade hi Flo microcatheter and 0.016 fathom wire were then used to select the splenic artery. Splenic angiogram was performed from proximal location which demonstrated irregularity of the splenic parenchymal vessels near the hilum with a paucity of opacification in the superior and peripheral spleen, compatible with splenic laceration. No pseudoaneurysm or active extravasation was visualized. An assortment of 4 mm to 7 mm Ruby micro coils were then deployed in the proximal splenic artery. Repeat splenic angiography after coil placement demonstrated adequate embolization of the proximal splenic artery. The catheters and sheath were removed over a guidewire and the right common femoral arteriotomy was closed with a 6 French Angio-Seal device. Patient tolerated the procedure well without immediate complication. The patient was transferred back to the emergency department after the procedure. IMPRESSION: 1. Angiographic findings compatible with splenic laceration, no evidence of pseudoaneurysm or active extravasation. 2. Technically  successful coil embolization of the proximal  splenic artery. [name], MD [facility] Electronically Signed   By: [name] MD   On: 10/25/2020 09:21   IR EMBO ART  VEN HEMORR LYMPH EXTRAV  INC GUIDE ROADMAPPING  Result Date: 10/25/2020 INDICATION: 85 year old female presenting to the emergency department 1 day after colonoscopy with upper abdominal pain. CT abdomen pelvis significant for moderate to high-grade splenic laceration. EXAM: 1. Ultrasound-guided right common femoral artery access. 2. Abdominal aortogram. 3. Selective catheterization of the splenic artery. 4. Splenic arteriogram. 5. Coil embolization of the proximal splenic artery. MEDICATIONS: None. ANESTHESIA/SEDATION: Moderate (conscious) sedation was employed during this procedure. A total of Versed 2 mg and Fentanyl 100 mcg was administered intravenously. Moderate Sedation Time: 67 minutes. The patient's level of consciousness and vital signs were monitored continuously by radiology nursing throughout the procedure under my direct supervision. CONTRAST:  12mL OMNIPAQUE IOHEXOL 300 MG/ML SOLN, 19mL OMNIPAQUE IOHEXOL 300 MG/ML SOLN FLUOROSCOPY TIME:  Fluoroscopy Time: 19 minutes 30 seconds (284 mGy). COMPLICATIONS: None immediate. PROCEDURE: Informed consent was obtained from the patient following explanation of the procedure, risks, benefits and alternatives. The patient understands, agrees and consents for the procedure. All questions were addressed. A time out was performed prior to the initiation of the procedure. Maximal barrier sterile technique utilized including caps, mask, sterile gowns, sterile gloves, large sterile drape, hand hygiene, and chlorhexidine prep. Under direct ultrasound visualization, the right common femoral artery was accessed with a micropuncture kit. A permanent ultrasound image was recorded. Limited right lower extremity angiogram demonstrated adequate puncture site for vascular closure device use.  Through the 5 French outer micropuncture sheath, a J wire was directed to the abdominal aorta. A 5 French, 11 cm vascular sheath was placed. A 5 French, C2 catheter was then advanced to the level of the celiac trunk. There was difficulty with engaging the celiac ostium. Therefore, the C2 was exchanged for a pigtail flush catheter. Abdominal aortic angiography was performed which demonstrated normal caliber patent aorta with moderate tortuosity related to the patient's scoliosis. The splenic artery appeared patent. Given the tortuosity and difficulty with celiac catheterization, a 35 cm 5 French sheath was inserted for stability. The 5 French C2 catheter was then able to select the proximal splenic artery. A Renegade hi Flo microcatheter and 0.016 fathom wire were then used to select the splenic artery. Splenic angiogram was performed from proximal location which demonstrated irregularity of the splenic parenchymal vessels near the hilum with a paucity of opacification in the superior and peripheral spleen, compatible with splenic laceration. No pseudoaneurysm or active extravasation was visualized. An assortment of 4 mm to 7 mm Ruby micro coils were then deployed in the proximal splenic artery. Repeat splenic angiography after coil placement demonstrated adequate embolization of the proximal splenic artery. The catheters and sheath were removed over a guidewire and the right common femoral arteriotomy was closed with a 6 French Angio-Seal device. Patient tolerated the procedure well without immediate complication. The patient was transferred back to the emergency department after the procedure. IMPRESSION: 1. Angiographic findings compatible with splenic laceration, no evidence of pseudoaneurysm or active extravasation. 2. Technically successful coil embolization of the proximal splenic artery. [name], MD [facility] Electronically Signed   By: [name] MD   On: 10/25/2020 09:21        Scheduled Meds: . sodium chloride   Intravenous Once  . acetaminophen  1,000  mg Oral Q6H  . Chlorhexidine Gluconate Cloth  6 each Topical Daily  . feeding supplement  1 Container Oral q12n4p  . feeding supplement  237 mL Oral BID BM  . levothyroxine  88 mcg Oral Q0600  . multivitamin with minerals  1 tablet Oral Daily  . mupirocin ointment  1 application Nasal BID   Continuous Infusions: . sodium chloride    . sodium chloride    . sodium chloride 10 mL/hr at 10/25/20 0900     LOS: 2 days    Time spent: 29mn  PDomenic Polite MD [facility]  10/26/2020, 10:51 AM

## 2020-10-26 NOTE — Progress Notes (Signed)
Tara Bridges [id number] 1936/01/31  CARE TEAM:  PCP: Reynold Bowen, MD  Outpatient Care Team: Patient Care Team: Reynold Bowen, MD as PCP - General (Endocrinology) Clent Jacks, MD as Consulting Physician (Ophthalmology) Dr. Randol Kern (Dentistry) Arvella Nigh, MD as Consulting Physician (Obstetrics and Gynecology)  Inpatient Treatment Team: Treatment Team: Attending Provider: Domenic Polite, MD; Consulting Physician: Ladene Artist, MD; Consulting Physician: Edison Pace, Md, MD; Rounding Team: Md, Trauma, MD; Technician: Pola Corn, NT; Rounding Team: Loura Back, MD; [place] Management: Lindell Noe, RN; Social Worker: Waynette Buttery, LCSW   Problem List:   Active Problems:   Splenic laceration, initial encounter      10/24/2020  IMPRESSION: 1. Angiographic findings compatible with splenic laceration, no evidence of pseudoaneurysm or active extravasation. 2. Technically successful coil embolization of the proximal splenic artery.  [name], MD  [facility]  [facility]      Assessment  Stable  [facility] Stay = 2 days)  Assessment/Plan  Grade 3 splenic laceration s/p colonoscopy  -s/p IR embolization of her spleen 2/18 -hgb 10s - stable.  S/p couple of units of pRBCs overnight, but patient is very stable this morning.  Off pressors.  -she is on a regular diet, will monitor that as she may get an ileus from her embolization but from her moderate amount of hemoperitoneum as well.  -patient may have some prolonged abdominal pain secondary to embolization as it is painful to go through the ischemic process with this. 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  She will also then need the second pneumococcal 23 vaccine by her PCP.   FEN - regular diet VTE - on hold ID - none  currently  Disposition:  Disposition:  The patient is from: Home  Anticipate discharge to:  Home  Anticipated Date of Discharge is:  February 20,2022    Barriers to discharge:  Pending Clinical improvement (more likely than not)  Patient currently is Stable for discharge from the hospital from a surgery standpoint.      20 minutes spent in review, evaluation, examination, counseling, and coordination of care.   I have reviewed this patient's available data, including medical history, events of note, physical examination and test results as part of my evaluation.  A significant portion of that time was spent in counseling.  Care during the described time interval was provided by me.  10/26/2020    Subjective: (Chief complaint)    Objective:  Vital signs:  Vitals:   10/25/20 1400 10/25/20 1525 10/25/20 2108 10/26/20 0320  BP: (!) 126/51 (!) 147/56 125/65 136/65  Pulse: 66 (!) 55 76 76  Resp: (!) _0 Temp:  98.6 F (37 C) 98.2 F (36.8 C) 97.6 F (36.4 C)  TempSrc:  Oral    SpO2: 93% 96% 90% 100%  Weight:      Height:        Last BM Date:  (PTA)  Intake/Output   Yesterday:  02/19 0701 - 02/20 0700 In: 840.1 [P.O.:720; I.V.:20; IV Piggyback:100.1] Out: 200 [Urine:200] This shift:  No intake/output data recorded.  Bowel function:  Flatus: YES  BM:  No  Drain: (No drain)   Physical Exam:  General: Pt awake/alert in no acute distress Eyes: PERRL, normal EOM.  Sclera clear.  No icterus Neuro: CN II-XII intact w/o focal sensory/motor deficits. Lymph: No head/neck/groin lymphadenopathy Psych:  No delerium/psychosis/paranoia.  Oriented x 4 HENT: Normocephalic, Mucus membranes moist.  No thrush Neck: Supple, No tracheal deviation.  No obvious thyromegaly Chest: No pain to chest wall compression.  Good respiratory excursion.  No audible wheezing CV:  Pulses intact.  Regular rhythm.  No major extremity edema MS: Normal AROM mjr joints.  No  obvious deformity  Abdomen: Soft.  Nondistended.  Tenderness at L upper flank.  No evidence of peritonitis.  No incarcerated hernias.  Ext:  No deformity.  No mjr edema.  No cyanosis Skin: No petechiae / purpurea.  No major sores.  Warm and dry    Results:   Cultures: Recent Results (from the past 720 hour(s))  Resp Panel by RT-PCR (Flu A&B, Covid) Nasopharyngeal Swab     Status: None   Collection Time: 10/24/20  6:52 PM   Specimen: Nasopharyngeal Swab; Nasopharyngeal(NP) swabs in vial transport medium  Result Value Ref Range Status   SARS Coronavirus 2 by RT PCR NEGATIVE NEGATIVE Final    Comment: (NOTE) SARS-CoV-2 target nucleic acids are NOT DETECTED.  The SARS-CoV-2 RNA is generally detectable in upper respiratory specimens during the acute phase of infection. The lowest concentration of SARS-CoV-2 viral copies this assay can detect is 138 copies/mL. A negative result does not preclude SARS-Cov-2 infection and should not be used as the sole basis for treatment or other patient management decisions. A negative result may occur with  improper specimen collection/handling, submission of specimen other than nasopharyngeal swab, presence of viral mutation(s) within the areas targeted by this assay, and inadequate number of viral copies(<138 copies/mL). A negative result must be combined with clinical observations, patient history, and epidemiological information. The expected result is Negative.  Fact Sheet for Patients:  EntrepreneurPulse.com.au  Fact Sheet for Healthcare Providers:  IncredibleEmployment.be  This test is no t yet approved or cleared by the [place] FDA and  has been authorized for detection and/or diagnosis of SARS-CoV-2 by FDA under an Emergency Use Authorization (EUA). This EUA will remain  in effect (meaning this test can be used) for the duration of the COVID-19 declaration under Section 564(b)(1) of the Act,  21 U.S.C.section 360bbb-3(b)(1), unless the authorization is terminated  or revoked sooner.       Influenza A by PCR NEGATIVE NEGATIVE Final   Influenza B by PCR NEGATIVE NEGATIVE Final    Comment: (NOTE) The Xpert Xpress SARS-CoV-2/FLU/RSV plus assay is intended as an aid in the diagnosis of influenza from Nasopharyngeal swab specimens and should not be used as a sole basis for treatment. Nasal washings and aspirates are unacceptable for Xpert Xpress SARS-CoV-2/FLU/RSV testing.  Fact Sheet for Patients: EntrepreneurPulse.com.au  Fact Sheet for Healthcare Providers: IncredibleEmployment.be  This test is not yet approved or cleared by the [place] FDA and has been authorized for detection and/or diagnosis of SARS-CoV-2 by FDA under an Emergency Use Authorization (EUA). This EUA will remain in effect (meaning this test can be used) for the duration of the COVID-19 declaration under Section 564(b)(1) of the Act, 21 U.S.C. section 360bbb-3(b)(1), unless the authorization is terminated or revoked.  Performed at [facility], [place] [address] [place], [place] [address]   MRSA PCR Screening     Status: Abnormal   Collection Time: 10/24/20 11:05 PM   Specimen: Nasal Mucosa; Nasopharyngeal  Result Value Ref Range Status   MRSA by PCR POSITIVE (A) NEGATIVE Final    Comment:        The GeneXpert MRSA Assay (FDA approved  for NASAL specimens only), is one component of a comprehensive MRSA colonization surveillance program. It is not intended to diagnose MRSA infection nor to guide or monitor treatment for MRSA infections. RESULT CALLED TO, READ BACK BY AND VERIFIED WITH: M HIGH RN 10/25/20 0553 JDW Performed at [facility], [address], [place], [place] [address]     Labs: Results for orders placed or performed during the hospital encounter of 10/24/20 (from the past 48 hour(s))  CBC with Differential      Status: Abnormal   Collection Time: 10/24/20  3:15 PM  Result Value Ref Range   WBC 13.2 (H) 4.0 - 10.5 K/uL   RBC 3.89 3.87 - 5.11 MIL/uL   Hemoglobin 12.2 12.0 - 15.0 g/dL   HCT 37.7 36.0 - 46.0 %   MCV 96.9 80.0 - 100.0 fL   MCH 31.4 26.0 - 34.0 pg   MCHC 32.4 30.0 - 36.0 g/dL   RDW 13.5 11.5 - 15.5 %   Platelets 281 150 - 400 K/uL   nRBC 0.0 0.0 - 0.2 %   Neutrophils Relative % 77 %   Neutro Abs 10.2 (H) 1.7 - 7.7 K/uL   Lymphocytes Relative 12 %   Lymphs Abs 1.6 0.7 - 4.0 K/uL   Monocytes Relative 8 %   Monocytes Absolute 1.0 0.1 - 1.0 K/uL   Eosinophils Relative 1 %   Eosinophils Absolute 0.2 0.0 - 0.5 K/uL   Basophils Relative 1 %   Basophils Absolute 0.1 0.0 - 0.1 K/uL   Immature Granulocytes 1 %   Abs Immature Granulocytes 0.07 0.00 - 0.07 K/uL    Comment: Performed at [facility], [place] [address] [place], [place] [address]  Comprehensive metabolic panel     Status: Abnormal   Collection Time: 10/24/20  3:15 PM  Result Value Ref Range   Sodium 131 (L) 135 - 145 mmol/L   Potassium 4.0 3.5 - 5.1 mmol/L   Chloride 98 98 - 111 mmol/L   CO2 21 (L) 22 - 32 mmol/L   Glucose, Bld 120 (H) 70 - 99 mg/dL    Comment: Glucose reference range applies only to samples taken after fasting for at least 8 hours.   BUN 17 8 - 23 mg/dL   Creatinine, Ser 1.04 (H) 0.44 - 1.00 mg/dL   Calcium 9.0 8.9 - 10.3 mg/dL   Total Protein 6.4 (L) 6.5 - 8.1 g/dL   Albumin 3.8 3.5 - 5.0 g/dL   AST 18 15 - 41 U/L   ALT 12 0 - 44 U/L   Alkaline Phosphatase 57 38 - 126 U/L   Total Bilirubin 0.9 0.3 - 1.2 mg/dL   GFR, Estimated 54 (L) >60 mL/min    Comment: (NOTE) Calculated using the CKD-EPI Creatinine Equation (2021)    Anion gap 12 5 - 15    Comment: Performed at [facility], [place] [address], [place], [place] [address]  Lipase, blood     Status: None   Collection Time: 10/24/20  3:15 PM  Result Value Ref Range   Lipase 27 11 - 51 U/L    Comment:  Performed at [facility], [place] [address], [place], [place] [address]  ABO/Rh     Status: None   Collection Time: 10/24/20  3:15 PM  Result Value Ref Range   ABO/RH(D)      O POS Performed at [facility], [place] [address] [place], [place] [address]  Lactic acid, plasma     Status: None   Collection Time: 10/24/20  3:16 PM  Result Value Ref Range   Lactic Acid, Venous 1.4 0.5 - 1.9 mmol/L    Comment: Performed at [facility], [place] [address] [place], [place] [address]  Type and screen Anna     Status: None   Collection Time: 10/24/20  5:26 PM  Result Value Ref Range   ABO/RH(D) O POS    Antibody Screen NEG    Sample Expiration 10/27/2020,2359    Unit Number [id number]    Blood Component Type RED CELLS,LR    Unit division 00    Status of Unit ISSUED,FINAL    Transfusion Status OK TO TRANSFUSE    Crossmatch Result COMPATIBLE    Unit tag comment      VERBAL ORDERS PER DR Ronnald Nian Performed at [facility], [place] [address], [place], [place] [address]    Unit Number [id number]    Blood Component Type RED CELLS,LR    Unit division 00    Status of Unit ISSUED,FINAL    Transfusion Status OK TO TRANSFUSE    Crossmatch Result Compatible   Prepare RBC (crossmatch)     Status: None   Collection Time: 10/24/20  6:14 PM  Result Value Ref Range   Order Confirmation      ORDER PROCESSED BY BLOOD BANK Performed at [facility], [place] [address], [place], [place] [address]   Prepare RBC (crossmatch)     Status: None   Collection Time: 10/24/20  6:17 PM  Result Value Ref Range   Order Confirmation      ORDER PROCESSED BY BLOOD BANK Performed at [facility], [place] [address] [place], [place] [address]   Resp Panel by RT-PCR (Flu A&B, Covid) Nasopharyngeal Swab     Status: None   Collection Time: 10/24/20  6:52 PM   Specimen: Nasopharyngeal  Swab; Nasopharyngeal(NP) swabs in vial transport medium  Result Value Ref Range   SARS Coronavirus 2 by RT PCR NEGATIVE NEGATIVE    Comment: (NOTE) SARS-CoV-2 target nucleic acids are NOT DETECTED.  The SARS-CoV-2 RNA is generally detectable in upper respiratory specimens during the acute phase of infection. The lowest concentration of SARS-CoV-2 viral copies this assay can detect is 138 copies/mL. A negative result does not preclude SARS-Cov-2 infection and should not be used as the sole basis for treatment or other patient management decisions. A negative result may occur with  improper specimen collection/handling, submission of specimen other than nasopharyngeal swab, presence of viral mutation(s) within the areas targeted by this assay, and inadequate number of viral copies(<138 copies/mL). A negative result must be combined with clinical observations, patient history, and epidemiological information. The expected result is Negative.  Fact Sheet for Patients:  EntrepreneurPulse.com.au  Fact Sheet for Healthcare Providers:  IncredibleEmployment.be  This test is no t yet approved or cleared by the [place] FDA and  has been authorized for detection and/or diagnosis of SARS-CoV-2 by FDA under an Emergency Use Authorization (EUA). This EUA will remain  in effect (meaning this test can be used) for the duration of the COVID-19 declaration under Section 564(b)(1) of the Act, 21 U.S.C.section 360bbb-3(b)(1), unless the authorization is terminated  or revoked sooner.       Influenza A by PCR NEGATIVE NEGATIVE   Influenza B by PCR NEGATIVE NEGATIVE    Comment: (NOTE) The Xpert Xpress SARS-CoV-2/FLU/RSV plus assay is intended as  an aid in the diagnosis of influenza from Nasopharyngeal swab specimens and should not be used as a sole basis for treatment. Nasal washings and aspirates are unacceptable for Xpert Xpress  SARS-CoV-2/FLU/RSV testing.  Fact Sheet for Patients: EntrepreneurPulse.com.au  Fact Sheet for Healthcare Providers: IncredibleEmployment.be  This test is not yet approved or cleared by the [place] FDA and has been authorized for detection and/or diagnosis of SARS-CoV-2 by FDA under an Emergency Use Authorization (EUA). This EUA will remain in effect (meaning this test can be used) for the duration of the COVID-19 declaration under Section 564(b)(1) of the Act, 21 U.S.C. section 360bbb-3(b)(1), unless the authorization is terminated or revoked.  Performed at [facility], [place] [address] [place], [place] [address]   Urinalysis, Routine w reflex microscopic     Status: Abnormal   Collection Time: 10/24/20  7:35 PM  Result Value Ref Range   Color, Urine YELLOW YELLOW   APPearance CLEAR CLEAR   Specific Gravity, Urine 1.010 1.005 - 1.030   pH 5.0 5.0 - 8.0   Glucose, UA NEGATIVE NEGATIVE mg/dL   Hgb urine dipstick NEGATIVE NEGATIVE   Bilirubin Urine NEGATIVE NEGATIVE   Ketones, ur 20 (A) NEGATIVE mg/dL   Protein, ur NEGATIVE NEGATIVE mg/dL   Nitrite NEGATIVE NEGATIVE   Leukocytes,Ua TRACE (A) NEGATIVE   RBC / HPF 0-5 0 - 5 RBC/hpf   WBC, UA 0-5 0 - 5 WBC/hpf   Bacteria, UA NONE SEEN NONE SEEN   Squamous Epithelial / LPF 6-10 0 - 5   Non Squamous Epithelial 0-5 (A) NONE SEEN    Comment: Performed at [facility], [place] [address] [place], [place] [address]  Glucose, capillary     Status: Abnormal   Collection Time: 10/24/20 10:56 PM  Result Value Ref Range   Glucose-Capillary 116 (H) 70 - 99 mg/dL    Comment: Glucose reference range applies only to samples taken after fasting for at least 8 hours.  MRSA PCR Screening     Status: Abnormal   Collection Time: 10/24/20 11:05 PM   Specimen: Nasal Mucosa; Nasopharyngeal  Result Value Ref Range   MRSA by PCR POSITIVE (A) NEGATIVE    Comment:         The GeneXpert MRSA Assay (FDA approved for NASAL specimens only), is one component of a comprehensive MRSA colonization surveillance program. It is not intended to diagnose MRSA infection nor to guide or monitor treatment for MRSA infections. RESULT CALLED TO, READ BACK BY AND VERIFIED WITH: M HIGH RN 10/25/20 0553 JDW Performed at [facility], [address] [place], [place] [address]   Glucose, capillary     Status: None   Collection Time: 10/25/20  7:30 AM  Result Value Ref Range   Glucose-Capillary 76 70 - 99 mg/dL    Comment: Glucose reference range applies only to samples taken after fasting for at least 8 hours.  CBC Once     Status: Abnormal   Collection Time: 10/25/20  8:55 AM  Result Value Ref Range   WBC 7.6 4.0 - 10.5 K/uL   RBC 3.39 (L) 3.87 - 5.11 MIL/uL   Hemoglobin 10.3 (L) 12.0 - 15.0 g/dL   HCT 31.1 (L) 36.0 - 46.0 %   MCV 91.7 80.0 - 100.0 fL   MCH 30.4 26.0 - 34.0 pg   MCHC 33.1 30.0 - 36.0 g/dL   RDW 16.7 (H) 11.5 - 15.5 %   Platelets 189 150 - 400  K/uL   nRBC 0.0 0.0 - 0.2 %    Comment: Performed at [facility], [place] [address], [place], [place] [address]  Comprehensive metabolic panel Once     Status: Abnormal   Collection Time: 10/25/20  8:55 AM  Result Value Ref Range   Sodium 134 (L) 135 - 145 mmol/L   Potassium 4.4 3.5 - 5.1 mmol/L   Chloride 105 98 - 111 mmol/L   CO2 21 (L) 22 - 32 mmol/L   Glucose, Bld 85 70 - 99 mg/dL    Comment: Glucose reference range applies only to samples taken after fasting for at least 8 hours.   BUN 12 8 - 23 mg/dL   Creatinine, Ser 0.98 0.44 - 1.00 mg/dL   Calcium 8.2 (L) 8.9 - 10.3 mg/dL   Total Protein 5.3 (L) 6.5 - 8.1 g/dL   Albumin 3.0 (L) 3.5 - 5.0 g/dL   AST 34 15 - 41 U/L   ALT 27 0 - 44 U/L   Alkaline Phosphatase 46 38 - 126 U/L   Total Bilirubin 0.9 0.3 - 1.2 mg/dL   GFR, Estimated 58 (L) >60 mL/min    Comment: (NOTE) Calculated using the CKD-EPI Creatinine Equation (2021)    Anion  gap 8 5 - 15    Comment: Performed at [facility], [place] [address] [place], [place] [address]  T4, free     Status: None   Collection Time: 10/25/20  8:55 AM  Result Value Ref Range   Free T4 0.95 0.61 - 1.12 ng/dL    Comment: (NOTE) Biotin ingestion may interfere with free T4 tests. If the results are inconsistent with the TSH level, previous test results, or the clinical presentation, then consider biotin interference. If needed, order repeat testing after stopping biotin. Performed at [facility], [place] [address], [place], [place] [address]   TSH     Status: None   Collection Time: 10/25/20  8:55 AM  Result Value Ref Range   TSH 3.377 0.350 - 4.500 uIU/mL    Comment: Performed by a 3rd Generation assay with a functional sensitivity of <=0.01 uIU/mL. Performed at [facility], [place] [address], [place], [place] [address]   Type and screen     Status: None   Collection Time: 10/25/20  9:00 AM  Result Value Ref Range   ABO/RH(D) O POS    Antibody Screen NEG    Sample Expiration      10/28/2020,2359 Performed at [facility], [place] [address] [place], [place] [address]   CBC     Status: Abnormal   Collection Time: 10/26/20  1:16 AM  Result Value Ref Range   WBC 10.0 4.0 - 10.5 K/uL   RBC 3.35 (L) 3.87 - 5.11 MIL/uL   Hemoglobin 10.1 (L) 12.0 - 15.0 g/dL   HCT 30.5 (L) 36.0 - 46.0 %   MCV 91.0 80.0 - 100.0 fL   MCH 30.1 26.0 - 34.0 pg   MCHC 33.1 30.0 - 36.0 g/dL   RDW 16.0 (H) 11.5 - 15.5 %   Platelets 179 150 - 400 K/uL   nRBC 0.0 0.0 - 0.2 %    Comment: Performed at [facility], [place] [address], [place], [place] [address]  Basic metabolic panel     Status: Abnormal   Collection Time: 10/26/20  1:16 AM  Result Value Ref Range   Sodium 132 (L) 135 - 145 mmol/L   Potassium  3.8 3.5 - 5.1 mmol/L   Chloride 102 98 - 111 mmol/L   CO2 22 22 - 32 mmol/L   Glucose, Bld 108 (H) 70 - 99 mg/dL    Comment: Glucose reference range applies only to  samples taken after fasting for at least 8 hours.   BUN 12 8 - 23 mg/dL   Creatinine, Ser 0.87 0.44 - 1.00 mg/dL   Calcium 8.3 (L) 8.9 - 10.3 mg/dL   GFR, Estimated >60 >60 mL/min    Comment: (NOTE) Calculated using the CKD-EPI Creatinine Equation (2021)    Anion gap 8 5 - 15    Comment: Performed at [place] [address] [place], [place] [address]    Imaging / Studies: DG Chest 2 View  Result Date: 10/24/2020 CLINICAL DATA:  Shortness of breath EXAM: CHEST - 2 VIEW COMPARISON:  Chest CT October 21, 2017. FINDINGS: There is mild atelectatic change in the left base. The lungs elsewhere are clear. The heart size and pulmonary vascularity are normal. No adenopathy. No bone lesions. IMPRESSION: Mild left base atelectasis. Lungs elsewhere clear. Cardiac silhouette within normal limits. Electronically Signed   By: Lowella Grip III M.D.   On: 10/24/2020 15:15   CT Angio Chest PE W and/or Wo Contrast  Result Date: 10/24/2020 CLINICAL DATA:  Onset shortness of breath and abdominal pain today. Status post EGD and colonoscopy yesterday. EXAM: CT ANGIOGRAPHY CHEST WITH CONTRAST TECHNIQUE: Multidetector CT imaging of the chest was performed using the standard protocol during bolus administration of intravenous contrast. Multiplanar CT image reconstructions and MIPs were obtained to evaluate the vascular anatomy. CONTRAST:  100 mL OMNIPAQUE IOHEXOL 350 MG/ML SOLN COMPARISON:  PA and lateral chest earlier today. FINDINGS: Cardiovascular: Satisfactory opacification of the pulmonary arteries to the segmental level. No evidence of pulmonary embolism. Normal heart size. No pericardial effusion. Mediastinum/Nodes: No enlarged mediastinal, hilar, or axillary lymph nodes. Thyroid gland, trachea, and esophagus demonstrate no significant findings. Lungs/Pleura: Lungs demonstrate mild dependent atelectasis. Trace pleural effusions. Upper Abdomen: See report of dedicated abdomen and pelvis CT this same  day. Musculoskeletal: No acute abnormality. Review of the MIP images confirms the above findings. IMPRESSION: Negative for pulmonary embolus. Trace bilateral pleural effusions and mild dependent atelectasis. Electronically Signed   By: Inge Rise M.D.   On: 10/24/2020 17:52   CT ABDOMEN PELVIS W CONTRAST  Result Date: 10/24/2020 CLINICAL DATA:  Onset shortness of breath and abdominal pain today. EXAM: CT ABDOMEN AND PELVIS WITH CONTRAST TECHNIQUE: Multidetector CT imaging of the abdomen and pelvis was performed using the standard protocol following bolus administration of intravenous contrast. CONTRAST:  100 mL OMNIPAQUE IOHEXOL 350 MG/ML SOLN COMPARISON:  None. FINDINGS: Lower chest: See report of dedicated chest CT today. Hepatobiliary: Scattered cysts are seen in the liver. No worrisome lesion. Biliary tree and gallbladder are negative. Pancreas: Unremarkable. No pancreatic ductal dilatation or surrounding inflammatory changes. Spleen: The patient has a large spleen laceration with surrounding hematoma. There is a small volume of active extravasation contrast as best seen on images 20-23 of series 2. Adrenals/Urinary Tract: Adrenal glands are unremarkable. Kidneys are normal, without renal calculi, focal lesion, or hydronephrosis. Bladder is unremarkable. Stomach/Bowel: Stomach is within normal limits. Appendix appears normal. No evidence of bowel wall thickening, distention, or inflammatory changes. Vascular/Lymphatic: Aortic atherosclerosis. No enlarged abdominal or pelvic lymph nodes. Reproductive: Uterus and bilateral adnexa are unremarkable. Other: Scattered abdominal and pelvic fluid and hemorrhage are identified. Musculoskeletal: No acute or focal abnormality. Scoliosis  and multilevel lumbar degenerative change noted. IMPRESSION: Large splenic laceration with a small volume active extravasation of contrast material consistent with least a grade 3 injury. Recommend consultation with surgery or  interventional radiology. No other acute abnormality. Critical Value/emergent results were called by telephone at the time of interpretation on 10/24/2020 at 5:45 pm to provider ADAM CURATOLO , who verbally acknowledged these results. Electronically Signed   By: Inge Rise M.D.   On: 10/24/2020 17:46   IR Aortagram Abdominal Serialogram  Result Date: 10/25/2020 INDICATION: 85 year old female presenting to the emergency department 1 day after colonoscopy with upper abdominal pain. CT abdomen pelvis significant for moderate to high-grade splenic laceration. EXAM: 1. Ultrasound-guided right common femoral artery access. 2. Abdominal aortogram. 3. Selective catheterization of the splenic artery. 4. Splenic arteriogram. 5. Coil embolization of the proximal splenic artery. MEDICATIONS: None. ANESTHESIA/SEDATION: Moderate (conscious) sedation was employed during this procedure. A total of Versed 2 mg and Fentanyl 100 mcg was administered intravenously. Moderate Sedation Time: 67 minutes. The patient's level of consciousness and vital signs were monitored continuously by radiology nursing throughout the procedure under my direct supervision. CONTRAST:  52m OMNIPAQUE IOHEXOL 300 MG/ML SOLN, 4107mOMNIPAQUE IOHEXOL 300 MG/ML SOLN FLUOROSCOPY TIME:  Fluoroscopy Time: 19 minutes 30 seconds (284 mGy). COMPLICATIONS: None immediate. PROCEDURE: Informed consent was obtained from the patient following explanation of the procedure, risks, benefits and alternatives. The patient understands, agrees and consents for the procedure. All questions were addressed. A time out was performed prior to the initiation of the procedure. Maximal barrier sterile technique utilized including caps, mask, sterile gowns, sterile gloves, large sterile drape, hand hygiene, and chlorhexidine prep. Under direct ultrasound visualization, the right common femoral artery was accessed with a micropuncture kit. A permanent ultrasound image was  recorded. Limited right lower extremity angiogram demonstrated adequate puncture site for vascular closure device use. Through the 5 French outer micropuncture sheath, a J wire was directed to the abdominal aorta. A 5 French, 11 cm vascular sheath was placed. A 5 French, C2 catheter was then advanced to the level of the celiac trunk. There was difficulty with engaging the celiac ostium. Therefore, the C2 was exchanged for a pigtail flush catheter. Abdominal aortic angiography was performed which demonstrated normal caliber patent aorta with moderate tortuosity related to the patient's scoliosis. The splenic artery appeared patent. Given the tortuosity and difficulty with celiac catheterization, a 35 cm 5 French sheath was inserted for stability. The 5 French C2 catheter was then able to select the proximal splenic artery. A Renegade hi Flo microcatheter and 0.016 fathom wire were then used to select the splenic artery. Splenic angiogram was performed from proximal location which demonstrated irregularity of the splenic parenchymal vessels near the hilum with a paucity of opacification in the superior and peripheral spleen, compatible with splenic laceration. No pseudoaneurysm or active extravasation was visualized. An assortment of 4 mm to 7 mm Ruby micro coils were then deployed in the proximal splenic artery. Repeat splenic angiography after coil placement demonstrated adequate embolization of the proximal splenic artery. The catheters and sheath were removed over a guidewire and the right common femoral arteriotomy was closed with a 6 French Angio-Seal device. Patient tolerated the procedure well without immediate complication. The patient was transferred back to the emergency department after the procedure. IMPRESSION: 1. Angiographic findings compatible with splenic laceration, no evidence of pseudoaneurysm or active extravasation. 2. Technically successful coil embolization of the proximal splenic artery.  DyRuthann CancerMD Vascular and Interventional Radiology  Specialists [facility] Radiology Electronically Signed   By: [name] MD   On: 10/25/2020 09:21   IR Angiogram Visceral Selective  Result Date: 10/25/2020 INDICATION: 85 year old female presenting to the emergency department 1 day after colonoscopy with upper abdominal pain. CT abdomen pelvis significant for moderate to high-grade splenic laceration. EXAM: 1. Ultrasound-guided right common femoral artery access. 2. Abdominal aortogram. 3. Selective catheterization of the splenic artery. 4. Splenic arteriogram. 5. Coil embolization of the proximal splenic artery. MEDICATIONS: None. ANESTHESIA/SEDATION: Moderate (conscious) sedation was employed during this procedure. A total of Versed 2 mg and Fentanyl 100 mcg was administered intravenously. Moderate Sedation Time: 67 minutes. The patient's level of consciousness and vital signs were monitored continuously by radiology nursing throughout the procedure under my direct supervision. CONTRAST:  64m OMNIPAQUE IOHEXOL 300 MG/ML SOLN, 443mOMNIPAQUE IOHEXOL 300 MG/ML SOLN FLUOROSCOPY TIME:  Fluoroscopy Time: 19 minutes 30 seconds (284 mGy). COMPLICATIONS: None immediate. PROCEDURE: Informed consent was obtained from the patient following explanation of the procedure, risks, benefits and alternatives. The patient understands, agrees and consents for the procedure. All questions were addressed. A time out was performed prior to the initiation of the procedure. Maximal barrier sterile technique utilized including caps, mask, sterile gowns, sterile gloves, large sterile drape, hand hygiene, and chlorhexidine prep. Under direct ultrasound visualization, the right common femoral artery was accessed with a micropuncture kit. A permanent ultrasound image was recorded. Limited right lower extremity angiogram demonstrated adequate puncture site for vascular closure device use. Through the 5 French outer micropuncture  sheath, a J wire was directed to the abdominal aorta. A 5 French, 11 cm vascular sheath was placed. A 5 French, C2 catheter was then advanced to the level of the celiac trunk. There was difficulty with engaging the celiac ostium. Therefore, the C2 was exchanged for a pigtail flush catheter. Abdominal aortic angiography was performed which demonstrated normal caliber patent aorta with moderate tortuosity related to the patient's scoliosis. The splenic artery appeared patent. Given the tortuosity and difficulty with celiac catheterization, a 35 cm 5 French sheath was inserted for stability. The 5 French C2 catheter was then able to select the proximal splenic artery. A Renegade hi Flo microcatheter and 0.016 fathom wire were then used to select the splenic artery. Splenic angiogram was performed from proximal location which demonstrated irregularity of the splenic parenchymal vessels near the hilum with a paucity of opacification in the superior and peripheral spleen, compatible with splenic laceration. No pseudoaneurysm or active extravasation was visualized. An assortment of 4 mm to 7 mm Ruby micro coils were then deployed in the proximal splenic artery. Repeat splenic angiography after coil placement demonstrated adequate embolization of the proximal splenic artery. The catheters and sheath were removed over a guidewire and the right common femoral arteriotomy was closed with a 6 French Angio-Seal device. Patient tolerated the procedure well without immediate complication. The patient was transferred back to the emergency department after the procedure. IMPRESSION: 1. Angiographic findings compatible with splenic laceration, no evidence of pseudoaneurysm or active extravasation. 2. Technically successful coil embolization of the proximal splenic artery. DyRuthann CancerMD Vascular and Interventional Radiology Specialists [facility] Electronically Signed   By: DyRuthann CancerD   On: 10/25/2020 09:21   IR  US[place]ore[place]ide Vasc Access Right  Result Date: 10/25/2020 INDICATION: [age]ear old female presenting to the emergency department 1 day after colonoscopy with upper abdominal pain. CT abdomen pelvis significant for moderate to high-grade splenic laceration. EXAM: 1. Ultrasound-guided right common femoral artery  access. 2. Abdominal aortogram. 3. Selective catheterization of the splenic artery. 4. Splenic arteriogram. 5. Coil embolization of the proximal splenic artery. MEDICATIONS: None. ANESTHESIA/SEDATION: Moderate (conscious) sedation was employed during this procedure. A total of Versed 2 mg and Fentanyl 100 mcg was administered intravenously. Moderate Sedation Time: 67 minutes. The patient's level of consciousness and vital signs were monitored continuously by radiology nursing throughout the procedure under my direct supervision. CONTRAST:  89m OMNIPAQUE IOHEXOL 300 MG/ML SOLN, 467mOMNIPAQUE IOHEXOL 300 MG/ML SOLN FLUOROSCOPY TIME:  Fluoroscopy Time: 19 minutes 30 seconds (284 mGy). COMPLICATIONS: None immediate. PROCEDURE: Informed consent was obtained from the patient following explanation of the procedure, risks, benefits and alternatives. The patient understands, agrees and consents for the procedure. All questions were addressed. A time out was performed prior to the initiation of the procedure. Maximal barrier sterile technique utilized including caps, mask, sterile gowns, sterile gloves, large sterile drape, hand hygiene, and chlorhexidine prep. Under direct ultrasound visualization, the right common femoral artery was accessed with a micropuncture kit. A permanent ultrasound image was recorded. Limited right lower extremity angiogram demonstrated adequate puncture site for vascular closure device use. Through the 5 French outer micropuncture sheath, a J wire was directed to the abdominal aorta. A 5 French, 11 cm vascular sheath was placed. A 5 French, C2 catheter was then advanced to the level of the  celiac trunk. There was difficulty with engaging the celiac ostium. Therefore, the C2 was exchanged for a pigtail flush catheter. Abdominal aortic angiography was performed which demonstrated normal caliber patent aorta with moderate tortuosity related to the patient's scoliosis. The splenic artery appeared patent. Given the tortuosity and difficulty with celiac catheterization, a 35 cm 5 French sheath was inserted for stability. The 5 French C2 catheter was then able to select the proximal splenic artery. A Renegade hi Flo microcatheter and 0.016 fathom wire were then used to select the splenic artery. Splenic angiogram was performed from proximal location which demonstrated irregularity of the splenic parenchymal vessels near the hilum with a paucity of opacification in the superior and peripheral spleen, compatible with splenic laceration. No pseudoaneurysm or active extravasation was visualized. An assortment of 4 mm to 7 mm Ruby micro coils were then deployed in the proximal splenic artery. Repeat splenic angiography after coil placement demonstrated adequate embolization of the proximal splenic artery. The catheters and sheath were removed over a guidewire and the right common femoral arteriotomy was closed with a 6 French Angio-Seal device. Patient tolerated the procedure well without immediate complication. The patient was transferred back to the emergency department after the procedure. IMPRESSION: 1. Angiographic findings compatible with splenic laceration, no evidence of pseudoaneurysm or active extravasation. 2. Technically successful coil embolization of the proximal splenic artery. DyRuthann CancerMD Vascular and Interventional Radiology Specialists [facility] Electronically Signed   By: DyRuthann CancerD   On: 10/25/2020 09:21   IR EMBO ART  VEN HEMORR LYMPH EXTRAV  INC GUIDE ROADMAPPING  Result Date: 10/25/2020 INDICATION: [age]ear old female presenting to the emergency department 1 day  after colonoscopy with upper abdominal pain. CT abdomen pelvis significant for moderate to high-grade splenic laceration. EXAM: 1. Ultrasound-guided right common femoral artery access. 2. Abdominal aortogram. 3. Selective catheterization of the splenic artery. 4. Splenic arteriogram. 5. Coil embolization of the proximal splenic artery. MEDICATIONS: None. ANESTHESIA/SEDATION: Moderate (conscious) sedation was employed during this procedure. A total of Versed 2 mg and Fentanyl 100 mcg was administered intravenously. Moderate Sedation Time: 67 minutes. The patient's level  of consciousness and vital signs were monitored continuously by radiology nursing throughout the procedure under my direct supervision. CONTRAST:  53m OMNIPAQUE IOHEXOL 300 MG/ML SOLN, 466mOMNIPAQUE IOHEXOL 300 MG/ML SOLN FLUOROSCOPY TIME:  Fluoroscopy Time: 19 minutes 30 seconds (284 mGy). COMPLICATIONS: None immediate. PROCEDURE: Informed consent was obtained from the patient following explanation of the procedure, risks, benefits and alternatives. The patient understands, agrees and consents for the procedure. All questions were addressed. A time out was performed prior to the initiation of the procedure. Maximal barrier sterile technique utilized including caps, mask, sterile gowns, sterile gloves, large sterile drape, hand hygiene, and chlorhexidine prep. Under direct ultrasound visualization, the right common femoral artery was accessed with a micropuncture kit. A permanent ultrasound image was recorded. Limited right lower extremity angiogram demonstrated adequate puncture site for vascular closure device use. Through the 5 French outer micropuncture sheath, a J wire was directed to the abdominal aorta. A 5 French, 11 cm vascular sheath was placed. A 5 French, C2 catheter was then advanced to the level of the celiac trunk. There was difficulty with engaging the celiac ostium. Therefore, the C2 was exchanged for a pigtail flush catheter.  Abdominal aortic angiography was performed which demonstrated normal caliber patent aorta with moderate tortuosity related to the patient's scoliosis. The splenic artery appeared patent. Given the tortuosity and difficulty with celiac catheterization, a 35 cm 5 French sheath was inserted for stability. The 5 French C2 catheter was then able to select the proximal splenic artery. A Renegade hi Flo microcatheter and 0.016 fathom wire were then used to select the splenic artery. Splenic angiogram was performed from proximal location which demonstrated irregularity of the splenic parenchymal vessels near the hilum with a paucity of opacification in the superior and peripheral spleen, compatible with splenic laceration. No pseudoaneurysm or active extravasation was visualized. An assortment of 4 mm to 7 mm Ruby micro coils were then deployed in the proximal splenic artery. Repeat splenic angiography after coil placement demonstrated adequate embolization of the proximal splenic artery. The catheters and sheath were removed over a guidewire and the right common femoral arteriotomy was closed with a 6 French Angio-Seal device. Patient tolerated the procedure well without immediate complication. The patient was transferred back to the emergency department after the procedure. IMPRESSION: 1. Angiographic findings compatible with splenic laceration, no evidence of pseudoaneurysm or active extravasation. 2. Technically successful coil embolization of the proximal splenic artery. DyRuthann CancerMD Vascular and Interventional Radiology Specialists [facility] Electronically Signed   By: DyRuthann CancerD   On: 10/25/2020 09:21    Medications / Allergies: per chart  Antibiotics: Anti-infectives (From admission, onward)   None        Note: Portions of this report may have been transcribed using voice recognition software. Every effort was made to ensure accuracy; however, inadvertent computerized transcription  errors may be present.   Any transcriptional errors that result from this process are unintentional.    StAdin HectorMD, FACS, MASCRS Gastrointestinal and Minimally Invasive Surgery  [facility] [address] [phone]/Paging  CONTACT INFORMATION: Weekday (9AM-5PM) concerns: Call [facility] main office at [phone] (5PM-9AM) or Weekend/Holiday concerns: Check [email or web address] for General Surgery [facility] coverage (Please, do not use SecureChat as it is not reliable communication to operating surgeons for immediate patient care)      10/26/2020  11:02 AM

## 2020-10-27 ENCOUNTER — Telehealth: Payer: Self-pay | Admitting: Internal Medicine

## 2020-10-27 ENCOUNTER — Telehealth: Payer: Self-pay

## 2020-10-27 DIAGNOSIS — R578 Other shock: Secondary | ICD-10-CM

## 2020-10-27 LAB — BASIC METABOLIC PANEL
Anion gap: 7 (ref 5–15)
BUN: 6 mg/dL — ABNORMAL LOW (ref 8–23)
CO2: 25 mmol/L (ref 22–32)
Calcium: 8.3 mg/dL — ABNORMAL LOW (ref 8.9–10.3)
Chloride: 104 mmol/L (ref 98–111)
Creatinine, Ser: 0.81 mg/dL (ref 0.44–1.00)
GFR, Estimated: >60 mL/min (ref >60)
Glucose, Bld: 102 mg/dL — ABNORMAL HIGH (ref 70–99)
Potassium: 3.7 mmol/L (ref 3.5–5.1)
Sodium: 136 mmol/L (ref 135–145)

## 2020-10-27 LAB — CBC
HCT: 28.5 % — ABNORMAL LOW (ref 36.0–46.0)
Hemoglobin: 9.4 g/dL — ABNORMAL LOW (ref 12.0–15.0)
MCH: 30 pg (ref 26.0–34.0)
MCHC: 33 g/dL (ref 30.0–36.0)
MCV: 91.1 fL (ref 80.0–100.0)
Platelets: 175 10*3/uL (ref 150–400)
RBC: 3.13 MIL/uL — ABNORMAL LOW (ref 3.87–5.11)
RDW: 15.3 % (ref 11.5–15.5)
WBC: 9.5 10*3/uL (ref 4.0–10.5)
nRBC: 0 % (ref 0.0–0.2)

## 2020-10-27 MED ORDER — ACETAMINOPHEN 500 MG PO TABS: 500.0000 mg | ORAL_TABLET | Freq: Four times a day (QID) | ORAL | 0 refills | Status: AC | PRN

## 2020-10-27 MED ORDER — SENNOSIDES-DOCUSATE SODIUM 8.6-50 MG PO TABS: 1.0000 | ORAL_TABLET | Freq: Two times a day (BID) | ORAL | 0 refills | Status: DC | PRN

## 2020-10-27 MED ORDER — ACETAMINOPHEN-CODEINE #3 300-30 MG PO TABS: 1.0000 | ORAL_TABLET | Freq: Every evening | ORAL | Status: DC | PRN

## 2020-10-27 NOTE — Telephone Encounter (Signed)
  Follow up Call-  Call back number 10/23/2020  Post procedure Call Back phone  # [phone]  Permission to leave phone message Yes  Some recent data might be hidden     Patient questions:  Do you have a fever, pain , or abdominal swelling? No. Pain Score  0 *  Have you tolerated food without any problems? Yes.    Have you been able to return to your normal activities? No.  Do you have any questions about your discharge instructions: Diet   No. Medications  No. Follow up visit  No.  Do you have questions or concerns about your Care? Yes.    Spoke with patient who is currently in the hospital with a lacerated spleen post procedure.  Dr. Carlean Purl is aware of this and has spoken to the patient.  She is eating and drinking fine and is not in any pain now.  Was admitted to the hospital the day after her procedure with severe LUQ pain.   Actions: * If pain score is 4 or above: No action needed, pain <4. 1. Have you developed a fever since your procedure? no  2.   Have you had an respiratory symptoms (SOB or cough) since your procedure? no  3.   Have you tested positive for COVID 19 since your procedure no  4.   Have you had any family members/close contacts diagnosed with the COVID 19 since your procedure?  no   If yes to any of these questions please route to Joylene John, RN and Joella Prince, RN

## 2020-10-27 NOTE — Progress Notes (Signed)
Referring Physician(s): Dr. Lennice Sites  Supervising Physician: [name]  Patient Status:  [facility] - In-pt  Chief Complaint:  Splenic laceration following colonoscopy; S/p splenic angiogram with embolization 2/18 by Dr. Serafina Royals  Subjective:  Patient is sitting up in bed, her daughter is at the bedside, family also on speaker phone.  Reports she continues to improve daily.  No concerns at this time.  Surgery team also at bedside assisting with questions.    Allergies: Cephalexin, Doxycycline, Nitrofurantoin, Penicillins, Nitrofurantoin macrocrystal, and Lipitor [atorvastatin]  Medications: Prior to Admission medications   Medication Sig Start Date End Date Taking? Authorizing Provider  amLODipine (NORVASC) 10 MG tablet TAKE 1 TABLET BY MOUTH EVERY DAY IN THE EVENING Patient taking differently: Take 10 mg by mouth every evening. 05/07/20  Yes Adrian Prows, MD  atorvastatin (LIPITOR) 10 MG tablet Take 1 tablet (10 mg total) by mouth daily. Patient taking differently: Take 10 mg by mouth daily. Once a week 12/28/19 12/22/20 Yes Adrian Prows, MD  lisinopril (ZESTRIL) 20 MG tablet TAKE 1 TABLET BY MOUTH EVERY DAY 08/05/20  Yes Adrian Prows, MD  Biotin 300 MCG TABS Take by mouth daily.    [provider]  Calcium Carbonate-Vit D-Min (CALCIUM 1200 PO) Take by mouth. Viactiv    [provider]  Cholecalciferol (VITAMIN D-3) 5000 UNITS TABS Take by mouth daily.    [provider]  KRILL OIL PO Take by mouth. 3 x a week    [provider]  levothyroxine (SYNTHROID) 88 MCG tablet Take 88 mcg by mouth daily before breakfast. Tuesdays, Thursdays, Saturdays Sundays only take 1/2 tablet    [provider]  meloxicam (MOBIC) 7.5 MG tablet TAKE 1 TABLET BY MOUTH ONE TO TWO TIMES DAILY AS NEEDED 02/10/17   Binnie Rail, MD  NON FORMULARY Place 1 spray under the tongue 2 (two) times daily.    [provider]  PREMARIN vaginal cream USE 2 GRAMS  VAGINALLY EVERY NIGHT AT BEDTIME 1 -2 TIMES PER WEEK 04/21/15   [provider]  psyllium (KONSYL) 33 % POWD Take by mouth.    [provider]     Vital Signs: BP 97/77   Pulse 62   Temp 98 F (36.7 C) (Oral)   Resp (!) 21   Ht 5\' 6"  (1.676 m)   Wt 144 lb 2.9 oz (65.4 kg)   SpO2 95%   BMI 23.27 kg/m   Physical Exam  NAD, resting comfortably.  Abdomen: soft, mild tenderness.  Skin: Right femoral groin site is soft and non-tender. Dressing with some dried drainage.  No evidence of bruising.   Imaging: DG Chest 2 View  Result Date: 10/24/2020 CLINICAL DATA:  Shortness of breath EXAM: CHEST - 2 VIEW COMPARISON:  Chest CT October 21, 2017. FINDINGS: There is mild atelectatic change in the left base. The lungs elsewhere are clear. The heart size and pulmonary vascularity are normal. No adenopathy. No bone lesions. IMPRESSION: Mild left base atelectasis. Lungs elsewhere clear. Cardiac silhouette within normal limits. Electronically Signed   By: Lowella Grip III M.D.   On: 10/24/2020 15:15   CT Angio Chest PE W and/or Wo Contrast  Result Date: 10/24/2020 CLINICAL DATA:  Onset shortness of breath and abdominal pain today. Status post EGD and colonoscopy yesterday. EXAM: CT ANGIOGRAPHY CHEST WITH CONTRAST TECHNIQUE: Multidetector CT imaging of the chest was performed using the standard protocol during bolus administration of intravenous contrast. Multiplanar CT image reconstructions and MIPs were  obtained to evaluate the vascular anatomy. CONTRAST:  100 mL OMNIPAQUE IOHEXOL 350 MG/ML SOLN COMPARISON:  PA and lateral chest earlier today. FINDINGS: Cardiovascular: Satisfactory opacification of the pulmonary arteries to the segmental level. No evidence of pulmonary embolism. Normal heart size. No pericardial effusion. Mediastinum/Nodes: No enlarged mediastinal, hilar, or axillary lymph nodes. Thyroid gland, trachea, and esophagus demonstrate no significant findings.  Lungs/Pleura: Lungs demonstrate mild dependent atelectasis. Trace pleural effusions. Upper Abdomen: See report of dedicated abdomen and pelvis CT this same day. Musculoskeletal: No acute abnormality. Review of the MIP images confirms the above findings. IMPRESSION: Negative for pulmonary embolus. Trace bilateral pleural effusions and mild dependent atelectasis. Electronically Signed   By: Inge Rise M.D.   On: 10/24/2020 17:52   CT ABDOMEN PELVIS W CONTRAST  Result Date: 10/24/2020 CLINICAL DATA:  Onset shortness of breath and abdominal pain today. EXAM: CT ABDOMEN AND PELVIS WITH CONTRAST TECHNIQUE: Multidetector CT imaging of the abdomen and pelvis was performed using the standard protocol following bolus administration of intravenous contrast. CONTRAST:  100 mL OMNIPAQUE IOHEXOL 350 MG/ML SOLN COMPARISON:  None. FINDINGS: Lower chest: See report of dedicated chest CT today. Hepatobiliary: Scattered cysts are seen in the liver. No worrisome lesion. Biliary tree and gallbladder are negative. Pancreas: Unremarkable. No pancreatic ductal dilatation or surrounding inflammatory changes. Spleen: The patient has a large spleen laceration with surrounding hematoma. There is a small volume of active extravasation contrast as best seen on images 20-23 of series 2. Adrenals/Urinary Tract: Adrenal glands are unremarkable. Kidneys are normal, without renal calculi, focal lesion, or hydronephrosis. Bladder is unremarkable. Stomach/Bowel: Stomach is within normal limits. Appendix appears normal. No evidence of bowel wall thickening, distention, or inflammatory changes. Vascular/Lymphatic: Aortic atherosclerosis. No enlarged abdominal or pelvic lymph nodes. Reproductive: Uterus and bilateral adnexa are unremarkable. Other: Scattered abdominal and pelvic fluid and hemorrhage are identified. Musculoskeletal: No acute or focal abnormality. Scoliosis and multilevel lumbar degenerative change noted. IMPRESSION: Large splenic  laceration with a small volume active extravasation of contrast material consistent with least a grade 3 injury. Recommend consultation with surgery or interventional radiology. No other acute abnormality. Critical Value/emergent results were called by telephone at the time of interpretation on 10/24/2020 at 5:45 pm to provider ADAM CURATOLO , who verbally acknowledged these results. Electronically Signed   By: Inge Rise M.D.   On: 10/24/2020 17:46    Labs:  CBC: Recent Labs    08/21/20 1046 10/24/20 1515  WBC 5.2 13.2*  HGB 13.9 12.2  HCT 41.7 37.7  PLT 285.0 281    COAGS: No results for input(s): INR, APTT in the last 8760 hours.  BMP: Recent Labs    08/21/20 1046 10/24/20 1515  NA 133* 131*  K 4.5 4.0  CL 99 98  CO2 28 21*  GLUCOSE 86 120*  BUN 25* 17  CALCIUM 9.4 9.0  CREATININE 1.10 1.04*  GFRNONAA  --  54*    LIVER FUNCTION TESTS: Recent Labs    08/21/20 1046 10/24/20 1515  BILITOT 0.6 0.9  AST 12 18  ALT 10 12  ALKPHOS 64 57  PROT 7.7 6.4*  ALBUMIN 4.3 3.8    Assessment and Plan:  Splenic laceration following colonoscopy; S/p splenic angiogram Remains stable.  Belly distention and tightness reportedly improving.  Family at bedside and over phone updated and all questions answered.  HgB 9.4. Groin site intact.  Ok to replace with simple bandaid/dressing.   Other plans per primary teams. Please call IR with any  questions.  No outpatient follow-up needed at this time.    Brynda Greathouse, MS RD PA-C    I spent a total of 15 Minutes at the the patient's bedside AND on the patient's hospital floor or unit, greater than 50% of which was counseling/coordinating care for splenic hemorrhage.

## 2020-10-27 NOTE — Telephone Encounter (Signed)
Patient being discharged from the hospital today.  What time frame do you want to see her back in the office?

## 2020-10-27 NOTE — Telephone Encounter (Signed)
Patient notified

## 2020-10-27 NOTE — Care Management Important Message (Signed)
Important Message  Patient Details  Name: Tara Bridges MRN: [id number] Date of Birth: 04-07-1936   Medicare Important Message Given:  Yes     [name] [name] 10/27/2020, 4:43 PM

## 2020-10-27 NOTE — Progress Notes (Signed)
AVS given and reviewed with pt. Medications discussed. All questions answered to satisfaction. Pt verbalized understanding of information given. Pt escorted off the unit with all belongings via wheelchair by volunteer services.  

## 2020-10-27 NOTE — Care Management Important Message (Signed)
Important Message  Patient Details  Name: Tara Bridges MRN: [id number] Date of Birth: Aug 06, 1936   Medicare Important Message Given:  Yes     [name] [name] 10/27/2020, 4:48 PM

## 2020-10-27 NOTE — Discharge Summary (Addendum)
Physician Discharge Summary  Tara Bridges YQM:[id number] DOB: [birth year] DOA: 10/24/2020  PCP: Tara Bowen, MD  Admit date: 10/24/2020 Discharge date: 10/27/2020  Time spent: 35 minutes  Recommendations for Outpatient Follow-up:  1. PCP Dr. Forde Bridges in 1 week, needs second dose of pneumococcal vaccine in a few weeks 2. Gastroenterology Dr. Carlean Bridges in 1 month   Discharge Diagnoses:  Active Problems:   Splenic laceration, initial encounter   Hemorrhagic shock ([place]) Hypertension Hypothyroidism Weight loss  Discharge Condition: Stable  Diet recommendation: Low-sodium  Filed Weights   10/24/20 1447 10/24/20 1518 10/25/20 0500  Weight: 61.9 kg 59 kg 65.4 kg    History of present illness:  [name] with past history of hypothyroidism and hypertension underwent an EGD, colonoscopy in 2/17 for weight loss, Ailey early satiety, semisolid stools and incontinence.  Subsequently was admitted on 2/18 with severe abdominal pain in the ED she was noted to be orthostatic hypotensive, transfuse 1 unit of PRBC, has had a CT abdomen pelvis which noted a grade 3 splenic laceration  Hospital Course:   Splenic laceration -Stable and improved, transfused 1 unit of PRBC on admission, GI and IR consulted, underwent proximal splenic artery embolization on 2/18 in IR -Stable now, mild discomfort in her left upper quadrant which is to be expected secondary to embolization, improving, p.o. intake is better, ambulating independently, Hemoglobin is stable -given post splenectomy vaccines, Haemophilus influenza, meningococcal and pneumococcal vaccine prior to DC, will need second dose of pneumococcal vaccine in few weeks  Weight loss, early satiety, loose/semisolid stools -Etiology is unclear, EGD and colonoscopy unremarkable -CT chest abdomen pelvis with contrast on admission was benign except for Splenic lac as noted above -TSH is within normal limits -Could be secondary to malabsorption, continue  probiotics and Benefiber -Follow-up with gastroenterology  Hypertension -Lisinopril discontinued, amlodipine resumed at discharge - blood pressure stable  Discharge Exam: Vitals:   10/26/20 2130 10/27/20 0559  BP: (!) 164/68 (!) 148/74  Pulse: 73 81  Resp: 17 18  Temp: 98.7 F (37.1 C) 98.8 F (37.1 C)  SpO2: 92% 94%    General: AAOx3 Cardiovascular: S1S2/RRR Respiratory: CTAB  Discharge Instructions   Discharge Instructions    Diet - low sodium heart healthy   Complete by: As directed    Increase activity slowly   Complete by: As directed    No wound care   Complete by: As directed      Allergies as of 10/27/2020      Reactions   Cephalexin Rash   Because of a history of documented adverse serious drug reaction;Medi Alert bracelet  is recommended   Doxycycline Nausea Only   Nitrofurantoin Other (See Comments)   Fever, mental status changes Because of a history of documented adverse serious drug reaction;Medi Alert bracelet  is recommended   Penicillins Rash   Because of a history of documented adverse serious drug reaction;Medi Alert bracelet  is recommended   Lipitor [atorvastatin] Other (See Comments)   D/Ced 08/2012 due to reading "Brain Drain" , Dr Rip Harbour      Medication List    STOP taking these medications   lisinopril 20 MG tablet Commonly known as: ZESTRIL   meloxicam 7.5 MG tablet Commonly known as: MOBIC     TAKE these medications   acetaminophen 500 MG tablet Commonly known as: TYLENOL Take 1 tablet (500 mg total) by mouth every 6 (six) hours as needed.   amLODipine 10 MG tablet Commonly known as: NORVASC TAKE 1 TABLET BY MOUTH  EVERY DAY IN THE EVENING What changed:   how much to take  how to take this   atorvastatin 10 MG tablet Commonly known as: LIPITOR Take 1 tablet (10 mg total) by mouth daily. What changed: when to take this   Biotin 300 MCG Tabs Take 1 tablet by mouth daily.   CALCIUM 1200 PO Take 1 tablet by mouth  daily. Viactiv   hydroxypropyl methylcellulose / hypromellose 2.5 % ophthalmic solution Commonly known as: ISOPTO TEARS / GONIOVISC Place 1 drop into both eyes daily as needed for dry eyes.   levothyroxine 88 MCG tablet Commonly known as: SYNTHROID Take 44-88 mcg by mouth See admin instructions. Take 1 tablet (56mg) by mouth on Monday, Wednesday, and Friday. Take 1/2 tablet (474m) by mouth on Tuesday, Thursday, Saturday, and Sunday.   MAGNESIUM PO Take 1 tablet by mouth daily.   NON FORMULARY Place 1 spray under the tongue 2 (two) times daily. Health immune spray   Premarin vaginal cream Generic drug: conjugated estrogens Place 2 g vaginally once a week.   PROBIOTIC PO Take 1 Dose by mouth daily.   senna-docusate 8.6-50 MG tablet Commonly known as: Senokot-S Take 1 tablet by mouth 2 (two) times daily as needed for mild constipation.   Vitamin D-3 125 MCG (5000 UT) Tabs Take 1 tablet by mouth every other day.      Allergies  Allergen Reactions  . Cephalexin Rash    Because of a history of documented adverse serious drug reaction;Medi Alert bracelet  is recommended  . Doxycycline Nausea Only  . Nitrofurantoin Other (See Comments)    Fever, mental status changes Because of a history of documented adverse serious drug reaction;Medi Alert bracelet  is recommended  . Penicillins Rash    Because of a history of documented adverse serious drug reaction;Medi Alert bracelet  is recommended  . Lipitor [Atorvastatin] Other (See Comments)    D/Ced 08/2012 due to reading "Brain Drain" , Dr PeRip Harbour  Follow-up Information    SoReynold BowenMD. Schedule an appointment as soon as possible for a visit in 1 week(s).   Specialty: Endocrinology Contact information: [address] [phone]      Sur[facility], [place] [facility].   Specialty: General Surgery Why: call as needed. Contact information: [address] [place]  [phone]              The results of significant diagnostics from this hospitalization (including imaging, microbiology, ancillary and laboratory) are listed below for reference.    Significant Diagnostic Studies: DG Chest 2 View  Result Date: 10/24/2020 CLINICAL DATA:  Shortness of breath EXAM: CHEST - 2 VIEW COMPARISON:  Chest CT October 21, 2017. FINDINGS: There is mild atelectatic change in the left base. The lungs elsewhere are clear. The heart size and pulmonary vascularity are normal. No adenopathy. No bone lesions. IMPRESSION: Mild left base atelectasis. Lungs elsewhere clear. Cardiac silhouette within normal limits. Electronically Signed   By: WiLowella GripII M.D.   On: 10/24/2020 15:15   CT Angio Chest PE W and/or Wo Contrast  Result Date: 10/24/2020 CLINICAL DATA:  Onset shortness of breath and abdominal pain today. Status post EGD and colonoscopy yesterday. EXAM: CT ANGIOGRAPHY CHEST WITH CONTRAST TECHNIQUE: Multidetector CT imaging of the chest was performed using the standard protocol during bolus administration of intravenous contrast. Multiplanar CT image reconstructions and MIPs were obtained to evaluate the vascular anatomy.  CONTRAST:  100 mL OMNIPAQUE IOHEXOL 350 MG/ML SOLN COMPARISON:  PA and lateral chest earlier today. FINDINGS: Cardiovascular: Satisfactory opacification of the pulmonary arteries to the segmental level. No evidence of pulmonary embolism. Normal heart size. No pericardial effusion. Mediastinum/Nodes: No enlarged mediastinal, hilar, or axillary lymph nodes. Thyroid gland, trachea, and esophagus demonstrate no significant findings. Lungs/Pleura: Lungs demonstrate mild dependent atelectasis. Trace pleural effusions. Upper Abdomen: See report of dedicated abdomen and pelvis CT this same day. Musculoskeletal: No acute abnormality. Review of the MIP images confirms the above findings. IMPRESSION: Negative for pulmonary embolus. Trace bilateral  pleural effusions and mild dependent atelectasis. Electronically Signed   By: Inge Rise M.D.   On: 10/24/2020 17:52   CT ABDOMEN PELVIS W CONTRAST  Result Date: 10/24/2020 CLINICAL DATA:  Onset shortness of breath and abdominal pain today. EXAM: CT ABDOMEN AND PELVIS WITH CONTRAST TECHNIQUE: Multidetector CT imaging of the abdomen and pelvis was performed using the standard protocol following bolus administration of intravenous contrast. CONTRAST:  100 mL OMNIPAQUE IOHEXOL 350 MG/ML SOLN COMPARISON:  None. FINDINGS: Lower chest: See report of dedicated chest CT today. Hepatobiliary: Scattered cysts are seen in the liver. No worrisome lesion. Biliary tree and gallbladder are negative. Pancreas: Unremarkable. No pancreatic ductal dilatation or surrounding inflammatory changes. Spleen: The patient has a large spleen laceration with surrounding hematoma. There is a small volume of active extravasation contrast as best seen on images 20-23 of series 2. Adrenals/Urinary Tract: Adrenal glands are unremarkable. Kidneys are normal, without renal calculi, focal lesion, or hydronephrosis. Bladder is unremarkable. Stomach/Bowel: Stomach is within normal limits. Appendix appears normal. No evidence of bowel wall thickening, distention, or inflammatory changes. Vascular/Lymphatic: Aortic atherosclerosis. No enlarged abdominal or pelvic lymph nodes. Reproductive: Uterus and bilateral adnexa are unremarkable. Other: Scattered abdominal and pelvic fluid and hemorrhage are identified. Musculoskeletal: No acute or focal abnormality. Scoliosis and multilevel lumbar degenerative change noted. IMPRESSION: Large splenic laceration with a small volume active extravasation of contrast material consistent with least a grade 3 injury. Recommend consultation with surgery or interventional radiology. No other acute abnormality. Critical Value/emergent results were called by telephone at the time of interpretation on 10/24/2020 at 5:45  pm to provider ADAM CURATOLO , who verbally acknowledged these results. Electronically Signed   By: Inge Rise M.D.   On: 10/24/2020 17:46   IR Aortagram Abdominal Serialogram  Result Date: 10/25/2020 INDICATION: 85 year old female presenting to the emergency department 1 day after colonoscopy with upper abdominal pain. CT abdomen pelvis significant for moderate to high-grade splenic laceration. EXAM: 1. Ultrasound-guided right common femoral artery access. 2. Abdominal aortogram. 3. Selective catheterization of the splenic artery. 4. Splenic arteriogram. 5. Coil embolization of the proximal splenic artery. MEDICATIONS: None. ANESTHESIA/SEDATION: Moderate (conscious) sedation was employed during this procedure. A total of Versed 2 mg and Fentanyl 100 mcg was administered intravenously. Moderate Sedation Time: 67 minutes. The patient's level of consciousness and vital signs were monitored continuously by radiology nursing throughout the procedure under my direct supervision. CONTRAST:  44mL OMNIPAQUE IOHEXOL 300 MG/ML SOLN, 50mL OMNIPAQUE IOHEXOL 300 MG/ML SOLN FLUOROSCOPY TIME:  Fluoroscopy Time: 19 minutes 30 seconds (284 mGy). COMPLICATIONS: None immediate. PROCEDURE: Informed consent was obtained from the patient following explanation of the procedure, risks, benefits and alternatives. The patient understands, agrees and consents for the procedure. All questions were addressed. A time out was performed prior to the initiation of the procedure. Maximal barrier sterile technique utilized including caps, mask, sterile gowns, sterile gloves, large sterile drape,  hand hygiene, and chlorhexidine prep. Under direct ultrasound visualization, the right common femoral artery was accessed with a micropuncture kit. A permanent ultrasound image was recorded. Limited right lower extremity angiogram demonstrated adequate puncture site for vascular closure device use. Through the 5 French outer micropuncture sheath, a  J wire was directed to the abdominal aorta. A 5 French, 11 cm vascular sheath was placed. A 5 French, C2 catheter was then advanced to the level of the celiac trunk. There was difficulty with engaging the celiac ostium. Therefore, the C2 was exchanged for a pigtail flush catheter. Abdominal aortic angiography was performed which demonstrated normal caliber patent aorta with moderate tortuosity related to the patient's scoliosis. The splenic artery appeared patent. Given the tortuosity and difficulty with celiac catheterization, a 35 cm 5 French sheath was inserted for stability. The 5 French C2 catheter was then able to select the proximal splenic artery. A Renegade hi Flo microcatheter and 0.016 fathom wire were then used to select the splenic artery. Splenic angiogram was performed from proximal location which demonstrated irregularity of the splenic parenchymal vessels near the hilum with a paucity of opacification in the superior and peripheral spleen, compatible with splenic laceration. No pseudoaneurysm or active extravasation was visualized. An assortment of 4 mm to 7 mm Ruby micro coils were then deployed in the proximal splenic artery. Repeat splenic angiography after coil placement demonstrated adequate embolization of the proximal splenic artery. The catheters and sheath were removed over a guidewire and the right common femoral arteriotomy was closed with a 6 French Angio-Seal device. Patient tolerated the procedure well without immediate complication. The patient was transferred back to the emergency department after the procedure. IMPRESSION: 1. Angiographic findings compatible with splenic laceration, no evidence of pseudoaneurysm or active extravasation. 2. Technically successful coil embolization of the proximal splenic artery. [name], MD [facility] Radiology Electronically Signed   By: [name] MD   On: 10/25/2020 09:21   IR Angiogram  Visceral Selective  Result Date: 10/25/2020 INDICATION: 85 year old female presenting to the emergency department 1 day after colonoscopy with upper abdominal pain. CT abdomen pelvis significant for moderate to high-grade splenic laceration. EXAM: 1. Ultrasound-guided right common femoral artery access. 2. Abdominal aortogram. 3. Selective catheterization of the splenic artery. 4. Splenic arteriogram. 5. Coil embolization of the proximal splenic artery. MEDICATIONS: None. ANESTHESIA/SEDATION: Moderate (conscious) sedation was employed during this procedure. A total of Versed 2 mg and Fentanyl 100 mcg was administered intravenously. Moderate Sedation Time: 67 minutes. The patient's level of consciousness and vital signs were monitored continuously by radiology nursing throughout the procedure under my direct supervision. CONTRAST:  58mL OMNIPAQUE IOHEXOL 300 MG/ML SOLN, 35mL OMNIPAQUE IOHEXOL 300 MG/ML SOLN FLUOROSCOPY TIME:  Fluoroscopy Time: 19 minutes 30 seconds (284 mGy). COMPLICATIONS: None immediate. PROCEDURE: Informed consent was obtained from the patient following explanation of the procedure, risks, benefits and alternatives. The patient understands, agrees and consents for the procedure. All questions were addressed. A time out was performed prior to the initiation of the procedure. Maximal barrier sterile technique utilized including caps, mask, sterile gowns, sterile gloves, large sterile drape, hand hygiene, and chlorhexidine prep. Under direct ultrasound visualization, the right common femoral artery was accessed with a micropuncture kit. A permanent ultrasound image was recorded. Limited right lower extremity angiogram demonstrated adequate puncture site for vascular closure device use. Through the 5 French outer micropuncture sheath, a J wire was directed to the abdominal aorta. A 5 French, 11 cm vascular  sheath was placed. A 5 French, C2 catheter was then advanced to the level of the celiac trunk.  There was difficulty with engaging the celiac ostium. Therefore, the C2 was exchanged for a pigtail flush catheter. Abdominal aortic angiography was performed which demonstrated normal caliber patent aorta with moderate tortuosity related to the patient's scoliosis. The splenic artery appeared patent. Given the tortuosity and difficulty with celiac catheterization, a 35 cm 5 French sheath was inserted for stability. The 5 French C2 catheter was then able to select the proximal splenic artery. A Renegade hi Flo microcatheter and 0.016 fathom wire were then used to select the splenic artery. Splenic angiogram was performed from proximal location which demonstrated irregularity of the splenic parenchymal vessels near the hilum with a paucity of opacification in the superior and peripheral spleen, compatible with splenic laceration. No pseudoaneurysm or active extravasation was visualized. An assortment of 4 mm to 7 mm Ruby micro coils were then deployed in the proximal splenic artery. Repeat splenic angiography after coil placement demonstrated adequate embolization of the proximal splenic artery. The catheters and sheath were removed over a guidewire and the right common femoral arteriotomy was closed with a 6 French Angio-Seal device. Patient tolerated the procedure well without immediate complication. The patient was transferred back to the emergency department after the procedure. IMPRESSION: 1. Angiographic findings compatible with splenic laceration, no evidence of pseudoaneurysm or active extravasation. 2. Technically successful coil embolization of the proximal splenic artery. [name], MD [facility] Radiology Electronically Signed   By: [name] MD   On: 10/25/2020 09:21   IR US Guide Vasc Access Right  Result Date: 10/25/2020 INDICATION: 85 year old female presenting to the emergency department 1 day after colonoscopy with upper abdominal pain. CT  abdomen pelvis significant for moderate to high-grade splenic laceration. EXAM: 1. Ultrasound-guided right common femoral artery access. 2. Abdominal aortogram. 3. Selective catheterization of the splenic artery. 4. Splenic arteriogram. 5. Coil embolization of the proximal splenic artery. MEDICATIONS: None. ANESTHESIA/SEDATION: Moderate (conscious) sedation was employed during this procedure. A total of Versed 2 mg and Fentanyl 100 mcg was administered intravenously. Moderate Sedation Time: 67 minutes. The patient's level of consciousness and vital signs were monitored continuously by radiology nursing throughout the procedure under my direct supervision. CONTRAST:  71mL OMNIPAQUE IOHEXOL 300 MG/ML SOLN, 49mL OMNIPAQUE IOHEXOL 300 MG/ML SOLN FLUOROSCOPY TIME:  Fluoroscopy Time: 19 minutes 30 seconds (284 mGy). COMPLICATIONS: None immediate. PROCEDURE: Informed consent was obtained from the patient following explanation of the procedure, risks, benefits and alternatives. The patient understands, agrees and consents for the procedure. All questions were addressed. A time out was performed prior to the initiation of the procedure. Maximal barrier sterile technique utilized including caps, mask, sterile gowns, sterile gloves, large sterile drape, hand hygiene, and chlorhexidine prep. Under direct ultrasound visualization, the right common femoral artery was accessed with a micropuncture kit. A permanent ultrasound image was recorded. Limited right lower extremity angiogram demonstrated adequate puncture site for vascular closure device use. Through the 5 French outer micropuncture sheath, a J wire was directed to the abdominal aorta. A 5 French, 11 cm vascular sheath was placed. A 5 French, C2 catheter was then advanced to the level of the celiac trunk. There was difficulty with engaging the celiac ostium. Therefore, the C2 was exchanged for a pigtail flush catheter. Abdominal aortic angiography was performed which  demonstrated normal caliber patent aorta with moderate tortuosity related to the patient's scoliosis. The splenic artery appeared  patent. Given the tortuosity and difficulty with celiac catheterization, a 35 cm 5 French sheath was inserted for stability. The 5 French C2 catheter was then able to select the proximal splenic artery. A Renegade hi Flo microcatheter and 0.016 fathom wire were then used to select the splenic artery. Splenic angiogram was performed from proximal location which demonstrated irregularity of the splenic parenchymal vessels near the hilum with a paucity of opacification in the superior and peripheral spleen, compatible with splenic laceration. No pseudoaneurysm or active extravasation was visualized. An assortment of 4 mm to 7 mm Ruby micro coils were then deployed in the proximal splenic artery. Repeat splenic angiography after coil placement demonstrated adequate embolization of the proximal splenic artery. The catheters and sheath were removed over a guidewire and the right common femoral arteriotomy was closed with a 6 French Angio-Seal device. Patient tolerated the procedure well without immediate complication. The patient was transferred back to the emergency department after the procedure. IMPRESSION: 1. Angiographic findings compatible with splenic laceration, no evidence of pseudoaneurysm or active extravasation. 2. Technically successful coil embolization of the proximal splenic artery. [name], MD [facility] Radiology Electronically Signed   By: [name] MD   On: 10/25/2020 09:21   IR EMBO ART  VEN HEMORR LYMPH EXTRAV  INC GUIDE ROADMAPPING  Result Date: 10/25/2020 INDICATION: 85 year old female presenting to the emergency department 1 day after colonoscopy with upper abdominal pain. CT abdomen pelvis significant for moderate to high-grade splenic laceration. EXAM: 1. Ultrasound-guided right common femoral artery  access. 2. Abdominal aortogram. 3. Selective catheterization of the splenic artery. 4. Splenic arteriogram. 5. Coil embolization of the proximal splenic artery. MEDICATIONS: None. ANESTHESIA/SEDATION: Moderate (conscious) sedation was employed during this procedure. A total of Versed 2 mg and Fentanyl 100 mcg was administered intravenously. Moderate Sedation Time: 67 minutes. The patient's level of consciousness and vital signs were monitored continuously by radiology nursing throughout the procedure under my direct supervision. CONTRAST:  54mL OMNIPAQUE IOHEXOL 300 MG/ML SOLN, 59mL OMNIPAQUE IOHEXOL 300 MG/ML SOLN FLUOROSCOPY TIME:  Fluoroscopy Time: 19 minutes 30 seconds (284 mGy). COMPLICATIONS: None immediate. PROCEDURE: Informed consent was obtained from the patient following explanation of the procedure, risks, benefits and alternatives. The patient understands, agrees and consents for the procedure. All questions were addressed. A time out was performed prior to the initiation of the procedure. Maximal barrier sterile technique utilized including caps, mask, sterile gowns, sterile gloves, large sterile drape, hand hygiene, and chlorhexidine prep. Under direct ultrasound visualization, the right common femoral artery was accessed with a micropuncture kit. A permanent ultrasound image was recorded. Limited right lower extremity angiogram demonstrated adequate puncture site for vascular closure device use. Through the 5 French outer micropuncture sheath, a J wire was directed to the abdominal aorta. A 5 French, 11 cm vascular sheath was placed. A 5 French, C2 catheter was then advanced to the level of the celiac trunk. There was difficulty with engaging the celiac ostium. Therefore, the C2 was exchanged for a pigtail flush catheter. Abdominal aortic angiography was performed which demonstrated normal caliber patent aorta with moderate tortuosity related to the patient's scoliosis. The splenic artery appeared  patent. Given the tortuosity and difficulty with celiac catheterization, a 35 cm 5 French sheath was inserted for stability. The 5 French C2 catheter was then able to select the proximal splenic artery. A Renegade hi Flo microcatheter and 0.016 fathom wire were then used to select the splenic artery. Splenic angiogram was performed  from proximal location which demonstrated irregularity of the splenic parenchymal vessels near the hilum with a paucity of opacification in the superior and peripheral spleen, compatible with splenic laceration. No pseudoaneurysm or active extravasation was visualized. An assortment of 4 mm to 7 mm Ruby micro coils were then deployed in the proximal splenic artery. Repeat splenic angiography after coil placement demonstrated adequate embolization of the proximal splenic artery. The catheters and sheath were removed over a guidewire and the right common femoral arteriotomy was closed with a 6 French Angio-Seal device. Patient tolerated the procedure well without immediate complication. The patient was transferred back to the emergency department after the procedure. IMPRESSION: 1. Angiographic findings compatible with splenic laceration, no evidence of pseudoaneurysm or active extravasation. 2. Technically successful coil embolization of the proximal splenic artery. [name], MD [facility] Electronically Signed   By: [name] MD   On: 10/25/2020 09:21    Microbiology: Recent Results (from the past 240 hour(s))  Resp Panel by RT-PCR (Flu A&B, Covid) Nasopharyngeal Swab     Status: None   Collection Time: 10/24/20  6:52 PM   Specimen: Nasopharyngeal Swab; Nasopharyngeal(NP) swabs in vial transport medium  Result Value Ref Range Status   SARS Coronavirus 2 by RT PCR NEGATIVE NEGATIVE Final    Comment: (NOTE) SARS-CoV-2 target nucleic acids are NOT DETECTED.  The SARS-CoV-2 RNA is generally detectable in upper  respiratory specimens during the acute phase of infection. The lowest concentration of SARS-CoV-2 viral copies this assay can detect is 138 copies/mL. A negative result does not preclude SARS-Cov-2 infection and should not be used as the sole basis for treatment or other patient management decisions. A negative result may occur with  improper specimen collection/handling, submission of specimen other than nasopharyngeal swab, presence of viral mutation(s) within the areas targeted by this assay, and inadequate number of viral copies(<138 copies/mL). A negative result must be combined with clinical observations, patient history, and epidemiological information. The expected result is Negative.  Fact Sheet for Patients:  EntrepreneurPulse.com.au  Fact Sheet for Healthcare Providers:  IncredibleEmployment.be  This test is no t yet approved or cleared by the [place] FDA and  has been authorized for detection and/or diagnosis of SARS-CoV-2 by FDA under an Emergency Use Authorization (EUA). This EUA will remain  in effect (meaning this test can be used) for the duration of the COVID-19 declaration under Section 564(b)(1) of the Act, 21 U.S.C.section 360bbb-3(b)(1), unless the authorization is terminated  or revoked sooner.       Influenza A by PCR NEGATIVE NEGATIVE Final   Influenza B by PCR NEGATIVE NEGATIVE Final    Comment: (NOTE) The Xpert Xpress SARS-CoV-2/FLU/RSV plus assay is intended as an aid in the diagnosis of influenza from Nasopharyngeal swab specimens and should not be used as a sole basis for treatment. Nasal washings and aspirates are unacceptable for Xpert Xpress SARS-CoV-2/FLU/RSV testing.  Fact Sheet for Patients: EntrepreneurPulse.com.au  Fact Sheet for Healthcare Providers: IncredibleEmployment.be  This test is not yet approved or cleared by the [place] FDA and has been  authorized for detection and/or diagnosis of SARS-CoV-2 by FDA under an Emergency Use Authorization (EUA). This EUA will remain in effect (meaning this test can be used) for the duration of the COVID-19 declaration under Section 564(b)(1) of the Act, 21 U.S.C. section 360bbb-3(b)(1), unless the authorization is terminated or revoked.  Performed at [facility], [place] [address] [place], [place] [address]  MRSA PCR Screening     Status: Abnormal   Collection Time: 10/24/20 11:05 PM   Specimen: Nasal Mucosa; Nasopharyngeal  Result Value Ref Range Status   MRSA by PCR POSITIVE (A) NEGATIVE Final    Comment:        The GeneXpert MRSA Assay (FDA approved for NASAL specimens only), is one component of a comprehensive MRSA colonization surveillance program. It is not intended to diagnose MRSA infection nor to guide or monitor treatment for MRSA infections. RESULT CALLED TO, READ BACK BY AND VERIFIED WITH: M HIGH RN 10/25/20 0553 JDW Performed at [facility], [address], [place], [place] [address]      Labs: Basic Metabolic Panel: Recent Labs  Lab 10/24/20 1515 10/25/20 0855 10/26/20 0116 10/27/20 0141  NA 131* 134* 132* 136  K 4.0 4.4 3.8 3.7  CL 98 105 102 104  CO2 21* 21* 22 25  GLUCOSE 120* 85 108* 102*  BUN 17 12 12  6*  CREATININE 1.04* 0.98 0.87 0.81  CALCIUM 9.0 8.2* 8.3* 8.3*   Liver Function Tests: Recent Labs  Lab 10/24/20 1515 10/25/20 0855  AST 18 34  ALT 12 27  ALKPHOS 57 46  BILITOT 0.9 0.9  PROT 6.4* 5.3*  ALBUMIN 3.8 3.0*   Recent Labs  Lab 10/24/20 1515  LIPASE 27   No results for input(s): AMMONIA in the last 168 hours. CBC: Recent Labs  Lab 10/24/20 1515 10/25/20 0855 10/26/20 0116 10/27/20 0141  WBC 13.2* 7.6 10.0 9.5  NEUTROABS 10.2*  --   --   --   HGB 12.2 10.3* 10.1* 9.4*  HCT 37.7 31.1* 30.5* 28.5*  MCV 96.9 91.7 91.0 91.1  PLT 281 189 179 175   Cardiac Enzymes: No results for input(s):  CKTOTAL, CKMB, CKMBINDEX, TROPONINI in the last 168 hours. BNP: BNP (last 3 results) No results for input(s): BNP in the last 8760 hours.  ProBNP (last 3 results) No results for input(s): PROBNP in the last 8760 hours.  CBG: Recent Labs  Lab 10/24/20 2256 10/25/20 0730  GLUCAP 116* 76    Signed:  Domenic Polite MD.  [facility] 10/27/2020, 12:22 PM

## 2020-10-27 NOTE — Telephone Encounter (Signed)
Inbound call from patient's daughter requesting to schedule an appointment for patient for this week.  Informed daughter next availability will not be until end of March and requesting a call back from a nurse to discuss patient's issues.  Please advise.

## 2020-10-27 NOTE — Plan of Care (Signed)

## 2020-10-27 NOTE — Telephone Encounter (Signed)
T 101.5 F  No other sig sxs specifically no new or worse pain, no light-headedness  Had vaccinations in last 24 hrs and had splenic embolization  Advised acetaminophen 1000 mg and observe  To ED if new sxs/worse  Will f/u tomorrow

## 2020-10-27 NOTE — Progress Notes (Cosign Needed Addendum)
       Subjective: Reports she feels much better today compared to 24 hours ago - reports less abdominal pain and less distention. Endorses flatus but no BM yet. Denies vomiting. At baseline she reports nausea after breakfast that improves with time and application of heating pad to her abdomen. She ambulated to the bathroom independently and denies dizziness, light-headedness, palpitations.  ROS: See above, otherwise other systems negative  Objective: Vital signs in last 24 hours: Temp:  [98.4 F (36.9 C)-98.8 F (37.1 C)] 98.8 F (37.1 C) (02/21 0559) Pulse Rate:  [73-81] 81 (02/21 0559) Resp:  [17-18] 18 (02/21 0559) BP: (120-164)/(48-74) 148/74 (02/21 0559) SpO2:  [92 %-94 %] 94 % (02/21 0559) Last BM Date:  (PTA)  Intake/Output from previous day: 02/20 0701 - 02/21 0700 In: 1077 [P.O.:840] Out: -  Intake/Output this shift: No intake/output data recorded.  PE: Abd: soft,  Non-tender, mild distention, +BS all 4 quadrants   Lab Results:  Recent Labs    10/26/20 0116 10/27/20 0141  WBC 10.0 9.5  HGB 10.1* 9.4*  HCT 30.5* 28.5*  PLT 179 175   BMET Recent Labs    10/26/20 0116 10/27/20 0141  NA 132* 136  K 3.8 3.7  CL 102 104  CO2 22 25  GLUCOSE 108* 102*  BUN 12 6*  CREATININE 0.87 0.81  CALCIUM 8.3* 8.3*   PT/INR No results for input(s): LABPROT, INR in the last 72 hours. CMP     Component Value Date/Time   NA 136 10/27/2020 0141   NA 137 08/01/2019 0941   K 3.7 10/27/2020 0141   CL 104 10/27/2020 0141   CO2 25 10/27/2020 0141   GLUCOSE 102 (H) 10/27/2020 0141   GLUCOSE 87 07/15/2006 0000   BUN 6 (L) 10/27/2020 0141   BUN 28 (H) 08/01/2019 0941   CREATININE 0.81 10/27/2020 0141   CALCIUM 8.3 (L) 10/27/2020 0141   PROT 5.3 (L) 10/25/2020 0855   PROT 7.3 05/29/2019 1008   ALBUMIN 3.0 (L) 10/25/2020 0855   ALBUMIN 4.4 05/29/2019 1008   AST 34 10/25/2020 0855   ALT 27 10/25/2020 0855   ALKPHOS 46 10/25/2020 0855   BILITOT 0.9 10/25/2020 0855    BILITOT 0.4 05/29/2019 1008   GFRNONAA >60 10/27/2020 0141   GFRAA 56 (L) 08/01/2019 0941   Lipase     Component Value Date/Time   LIPASE 27 10/24/2020 1515       Studies/Results: No results found.  Anti-infectives: Anti-infectives (From admission, onward)   None     Assessment/Plan Grade 3 splenic laceration s/p colonoscopy  -s/p IR embolization of her spleen 2/18 - 2 u pRBC transfused 2/18-2/19 - hgb/hct stable 9.4/28/5 from 10/30.5  -continue current diet, continue home bowel reg -patient may have some prolonged abdominal pain secondary to embolization as it is painful to go through the ischemic process with this. 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  She will also then need the second pneumococcal 23 vaccine by her PCP in a few weeks.  - stable for discharge from surgical perspective  FEN - regular diet VTE - on hold ID - none currently   LOS: 3 days    Jill Alexanders , [facility] 10/27/2020, 11:25 AM Please see Amion for pager number during day hours 7:00am-4:30pm or 7:00am -11:30am on weekends

## 2020-10-27 NOTE — Telephone Encounter (Signed)
How about we double book 130 2/24?

## 2020-10-27 NOTE — Evaluation (Signed)
Occupational Therapy Evaluation Patient Details Name: Tara Bridges MRN: [id number] DOB: [birth year] Today's Date: 10/27/2020    History of Present Illness Pt is an 85 y/o female with PMH of hypothyroidism and hypertension underwent an EGD, colonoscopy in 2/17 for weight loss, early satiety, semisolid stools and incontinence. She was subsequently admitted on 2/18 with severe abdominal pain in the ED and was noted to be orthostatic hypotensive. She received 1 unit of PRBC. CT of the abdomen and pelvis noted a grade 3 splenic laceration.   Clinical Impression   Pt is functioning modified independently to independently in ADL. Recommended grab bars around tub and potentially a tub bench if pt is unstable or becomes fearful of stepping over the edge of the tub. Educated in compensatory strategies for LB bathing and dressing. No further OT needs.     Follow Up Recommendations  No OT follow up    Equipment Recommendations  None recommended by OT    Recommendations for Other Services       Precautions / Restrictions Precautions Precautions: Fall Restrictions Weight Bearing Restrictions: No      Mobility Bed Mobility Overal bed mobility: Modified Independent                  Transfers Overall transfer level: Independent Equipment used: None                  Balance Overall balance assessment: No apparent balance deficits (not formally assessed)                                         ADL either performed or assessed with clinical judgement   ADL Overall ADL's : Modified independent                                       General ADL Comments: recommended use of her long handled bath sponge for her feet and using figure 4 method for dressing feet     Vision         Perception     Praxis      Pertinent Vitals/Pain Pain Assessment: Faces Faces Pain Scale: Hurts a little bit Pain Location: abdomen Pain Descriptors /  Indicators: Sore     Hand Dominance Right   Extremity/Trunk Assessment Upper Extremity Assessment Upper Extremity Assessment: Overall WFL for tasks assessed   Lower Extremity Assessment Lower Extremity Assessment: Overall WFL for tasks assessed       Communication Communication Communication: No difficulties   Cognition Arousal/Alertness: Awake/alert Behavior During Therapy: WFL for tasks assessed/performed Overall Cognitive Status: Within Functional Limits for tasks assessed                                     General Comments       Exercises     Shoulder Instructions      Home Living Family/patient expects to be discharged to:: Private residence Living Arrangements: Alone Available Help at Discharge: Family;Available 24 hours/day Type of Home: House Home Access: Stairs to enter [employer] of Steps: 3 Entrance Stairs-Rails: Right;Left;Can reach both Home Layout: Two level;1/2 bath on main level Alternate Level Stairs-Number of Steps: flight Alternate Level Stairs-Rails: Right;Left Bathroom Shower/Tub: Tub/shower unit  Bathroom Toilet: Handicapped height     Home Equipment: None   Additional Comments: recommended and educated pt in placement of grab bars for tub      Prior Functioning/Environment Level of Independence: Independent                 OT Problem List:        OT Treatment/Interventions:      OT Goals(Current goals can be found in the care plan section) Acute Rehab OT Goals Patient Stated Goal: home today  OT Frequency:     Barriers to D/C:            Co-evaluation              AM-PAC OT "6 Clicks" Daily Activity     Outcome Measure Help from another person eating meals?: None Help from another person taking care of personal grooming?: None Help from another person toileting, which includes using toliet, bedpan, or urinal?: None Help from another person bathing (including washing, rinsing,  drying)?: None Help from another person to put on and taking off regular upper body clothing?: None Help from another person to put on and taking off regular lower body clothing?: None 6 Click Score: 24   End of Session    Activity Tolerance: Patient tolerated treatment well Patient left: in bed;with call bell/phone within reach;with family/visitor present  OT Visit Diagnosis: Pain                Time: 5462-7035 OT Time Calculation (min): 16 min Charges:  OT General Charges $OT Visit: 1 Visit OT Evaluation $OT Eval Low Complexity: 1 Low  Nestor Lewandowsky, OTR/L Acute Rehabilitation Services Pager: [phone] Office: [phone]  Malka So 10/27/2020, 11:38 AM

## 2020-10-28 ENCOUNTER — Ambulatory Visit: Payer: PPO | Admitting: Internal Medicine

## 2020-10-28 ENCOUNTER — Encounter: Payer: Self-pay | Admitting: Internal Medicine

## 2020-10-28 ENCOUNTER — Other Ambulatory Visit (INDEPENDENT_AMBULATORY_CARE_PROVIDER_SITE_OTHER): Payer: PPO

## 2020-10-28 VITALS — BP 106/78 | HR 80 | Ht 66.0 in | Wt 137.2 lb

## 2020-10-28 DIAGNOSIS — K2960 Other gastritis without bleeding: Secondary | ICD-10-CM

## 2020-10-28 DIAGNOSIS — R630 Anorexia: Secondary | ICD-10-CM

## 2020-10-28 DIAGNOSIS — J9811 Atelectasis: Secondary | ICD-10-CM

## 2020-10-28 DIAGNOSIS — D62 Acute posthemorrhagic anemia: Secondary | ICD-10-CM | POA: Diagnosis not present

## 2020-10-28 DIAGNOSIS — S36039D Unspecified laceration of spleen, subsequent encounter: Secondary | ICD-10-CM

## 2020-10-28 LAB — CBC WITH DIFFERENTIAL/PLATELET
Basophils Absolute: 0.1 10*3/uL (ref 0.0–0.1)
Basophils Relative: 0.7 % (ref 0.0–3.0)
Eosinophils Absolute: 0.1 10*3/uL (ref 0.0–0.7)
Eosinophils Relative: 1.3 % (ref 0.0–5.0)
HCT: 33.1 % — ABNORMAL LOW (ref 36.0–46.0)
Hemoglobin: 11 g/dL — ABNORMAL LOW (ref 12.0–15.0)
Lymphocytes Relative: 9.9 % — ABNORMAL LOW (ref 12.0–46.0)
Lymphs Abs: 1 10*3/uL (ref 0.7–4.0)
MCHC: 33.4 g/dL (ref 30.0–36.0)
MCV: 89.8 fl (ref 78.0–100.0)
Monocytes Absolute: 1.1 10*3/uL — ABNORMAL HIGH (ref 0.1–1.0)
Monocytes Relative: 11.1 % (ref 3.0–12.0)
Neutro Abs: 7.8 10*3/uL — ABNORMAL HIGH (ref 1.4–7.7)
Neutrophils Relative %: 77 % (ref 43.0–77.0)
Platelets: 245 10*3/uL (ref 150.0–400.0)
RBC: 3.68 Mil/uL — ABNORMAL LOW (ref 3.87–5.11)
RDW: 15.2 % (ref 11.5–15.5)
WBC: 10.1 10*3/uL (ref 4.0–10.5)

## 2020-10-28 LAB — BASIC METABOLIC PANEL
BUN: 13 mg/dL (ref 6–23)
CO2: 30 mEq/L (ref 19–32)
Calcium: 9.3 mg/dL (ref 8.4–10.5)
Chloride: 97 mEq/L (ref 96–112)
Creatinine, Ser: 0.88 mg/dL (ref 0.40–1.20)
GFR: 61.09 mL/min (ref >60.00)
Glucose, Bld: 100 mg/dL — ABNORMAL HIGH (ref 70–99)
Potassium: 3.9 mEq/L (ref 3.5–5.1)
Sodium: 134 mEq/L — ABNORMAL LOW (ref 135–145)
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
## 2020-10-28 NOTE — Telephone Encounter (Signed)
Follow-up communication with daughter this morning.  Tylenol helping.  She does not feel as good as yesterday, a little lightheaded.  I have recommended she see me at 1130 today.  Waiting on a response.  Confirmed with daughter - will schedule  Leave other appt for now

## 2020-10-28 NOTE — Telephone Encounter (Signed)
Per Dr Carlean Purl added on for 10/28/2020 at 11:30AM

## 2020-10-28 NOTE — Patient Instructions (Signed)
Your provider has requested that you go to the basement level for lab work before leaving today. Press "B" on the elevator. The lab is located at the first door on the left as you exit the elevator.  Force fluids per Dr Carlean Purl.  Due to recent changes in healthcare laws, you may see the results of your imaging and laboratory studies on MyChart before your provider has had a chance to review them.  We understand that in some cases there may be results that are confusing or concerning to you. Not all laboratory results come back in the same time frame and the provider may be waiting for multiple results in order to interpret others.  Please give [place] 48 hours in order for your provider to thoroughly review all the results before contacting the office for clarification of your results.   Purchase a incentive spirometer and use it as the handout describes.  Hold your amlodipine.  I appreciate the opportunity to care for you. Silvano Rusk, MD, [facility]

## 2020-10-29 ENCOUNTER — Telehealth: Payer: Self-pay | Admitting: Internal Medicine

## 2020-10-29 NOTE — Telephone Encounter (Signed)
1000 mg Tylenol every 6 hrs as needed

## 2020-10-29 NOTE — Telephone Encounter (Signed)
Dr. Carlean Purl, did you want her on a schedule for Tylenol or PRN??

## 2020-10-29 NOTE — Telephone Encounter (Signed)
Patient's daughter notified.

## 2020-10-29 NOTE — Telephone Encounter (Signed)
Pt's daughter is requesting a call back from a nurse to discuss how the pt should be taking tylenol, if every 4 hours or just when the pt feels pain. (divericulitis flare up)

## 2020-10-30 ENCOUNTER — Encounter: Payer: Self-pay | Admitting: Internal Medicine

## 2020-10-30 ENCOUNTER — Other Ambulatory Visit: Payer: Self-pay

## 2020-10-30 ENCOUNTER — Ambulatory Visit: Payer: PPO | Admitting: Internal Medicine

## 2020-10-30 VITALS — BP 112/58 | HR 75 | Ht 66.0 in | Wt 133.0 lb

## 2020-10-30 DIAGNOSIS — R61 Generalized hyperhidrosis: Secondary | ICD-10-CM

## 2020-10-30 DIAGNOSIS — I10 Essential (primary) hypertension: Secondary | ICD-10-CM

## 2020-10-30 DIAGNOSIS — R159 Full incontinence of feces: Secondary | ICD-10-CM

## 2020-10-30 DIAGNOSIS — D62 Acute posthemorrhagic anemia: Secondary | ICD-10-CM | POA: Insufficient documentation

## 2020-10-30 DIAGNOSIS — R630 Anorexia: Secondary | ICD-10-CM | POA: Insufficient documentation

## 2020-10-30 DIAGNOSIS — S36039D Unspecified laceration of spleen, subsequent encounter: Secondary | ICD-10-CM

## 2020-10-30 DIAGNOSIS — K2960 Other gastritis without bleeding: Secondary | ICD-10-CM | POA: Insufficient documentation

## 2020-10-30 DIAGNOSIS — N8110 Cystocele, unspecified: Secondary | ICD-10-CM

## 2020-10-30 MED ORDER — LISINOPRIL 20 MG PO TABS: 10.0000 mg | ORAL_TABLET | Freq: Every day | ORAL | 0 refills | Status: DC
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
## 2020-10-30 NOTE — Patient Instructions (Addendum)
Re-start your Lisinopril tomorrow. Start with 1/2 a tablet daily (10mg ). Work your way back towards your normal blood pressure medicines.  We have entered a physical therapy referral for you to get an appointment in late March/early April. They will contact you.  We will see you back on 11/11/2020 at 3:10pm.   I appreciate the opportunity to care for you. Silvano Rusk, MD, [facility]

## 2020-10-31 DIAGNOSIS — S36039D Unspecified laceration of spleen, subsequent encounter: Secondary | ICD-10-CM | POA: Diagnosis not present

## 2020-10-31 DIAGNOSIS — I2510 Atherosclerotic heart disease of native coronary artery without angina pectoris: Secondary | ICD-10-CM | POA: Diagnosis not present

## 2020-10-31 DIAGNOSIS — R634 Abnormal weight loss: Secondary | ICD-10-CM | POA: Diagnosis not present

## 2020-10-31 DIAGNOSIS — E039 Hypothyroidism, unspecified: Secondary | ICD-10-CM | POA: Diagnosis not present

## 2020-10-31 DIAGNOSIS — I129 Hypertensive chronic kidney disease with stage 1 through stage 4 chronic kidney disease, or unspecified chronic kidney disease: Secondary | ICD-10-CM | POA: Diagnosis not present

## 2020-10-31 DIAGNOSIS — D62 Acute posthemorrhagic anemia: Secondary | ICD-10-CM | POA: Diagnosis not present

## 2020-10-31 DIAGNOSIS — K635 Polyp of colon: Secondary | ICD-10-CM | POA: Diagnosis not present

## 2020-10-31 DIAGNOSIS — E871 Hypo-osmolality and hyponatremia: Secondary | ICD-10-CM | POA: Diagnosis not present

## 2020-10-31 DIAGNOSIS — I10 Essential (primary) hypertension: Secondary | ICD-10-CM | POA: Diagnosis not present

## 2020-10-31 DIAGNOSIS — N1831 Chronic kidney disease, stage 3a: Secondary | ICD-10-CM | POA: Diagnosis not present

## 2020-11-03 ENCOUNTER — Other Ambulatory Visit (INDEPENDENT_AMBULATORY_CARE_PROVIDER_SITE_OTHER): Payer: PPO

## 2020-11-03 ENCOUNTER — Ambulatory Visit (INDEPENDENT_AMBULATORY_CARE_PROVIDER_SITE_OTHER)
Admission: RE | Admit: 2020-11-03 | Discharge: 2020-11-03 | Disposition: A | Discharge status: Home / Self Care | Payer: PPO | Source: Ambulatory Visit | Attending: Internal Medicine | Admitting: Internal Medicine

## 2020-11-03 ENCOUNTER — Other Ambulatory Visit: Payer: Self-pay

## 2020-11-03 ENCOUNTER — Telehealth: Payer: Self-pay | Admitting: Internal Medicine

## 2020-11-03 DIAGNOSIS — R509 Fever, unspecified: Secondary | ICD-10-CM

## 2020-11-03 DIAGNOSIS — S36039D Unspecified laceration of spleen, subsequent encounter: Secondary | ICD-10-CM | POA: Diagnosis not present

## 2020-11-03 DIAGNOSIS — S36039A Unspecified laceration of spleen, initial encounter: Secondary | ICD-10-CM | POA: Diagnosis not present

## 2020-11-03 LAB — COMPREHENSIVE METABOLIC PANEL
ALT: 34 U/L (ref 0–35)
AST: 26 U/L (ref 0–37)
Albumin: 3.9 g/dL (ref 3.5–5.2)
Alkaline Phosphatase: 79 U/L (ref 39–117)
BUN: 20 mg/dL (ref 6–23)
CO2: 26 mEq/L (ref 19–32)
Calcium: 9.8 mg/dL (ref 8.4–10.5)
Chloride: 97 mEq/L (ref 96–112)
Creatinine, Ser: 1.11 mg/dL (ref 0.40–1.20)
GFR: 46.23 mL/min — ABNORMAL LOW (ref >60.00)
Glucose, Bld: 107 mg/dL — ABNORMAL HIGH (ref 70–99)
Potassium: 5 mEq/L (ref 3.5–5.1)
Sodium: 133 mEq/L — ABNORMAL LOW (ref 135–145)
Total Bilirubin: 0.8 mg/dL (ref 0.2–1.2)
Total Protein: 7.9 g/dL (ref 6.0–8.3)

## 2020-11-03 LAB — CBC WITH DIFFERENTIAL/PLATELET
Basophils Absolute: 0.1 10*3/uL (ref 0.0–0.1)
Basophils Relative: 1 % (ref 0.0–3.0)
Eosinophils Absolute: 0.2 10*3/uL (ref 0.0–0.7)
Eosinophils Relative: 2 % (ref 0.0–5.0)
HCT: 36.4 % (ref 36.0–46.0)
Hemoglobin: 12.1 g/dL (ref 12.0–15.0)
Lymphocytes Relative: 9.5 % — ABNORMAL LOW (ref 12.0–46.0)
Lymphs Abs: 1 10*3/uL (ref 0.7–4.0)
MCHC: 33.3 g/dL (ref 30.0–36.0)
MCV: 90.2 fl (ref 78.0–100.0)
Monocytes Absolute: 1.4 10*3/uL — ABNORMAL HIGH (ref 0.1–1.0)
Monocytes Relative: 13.1 % — ABNORMAL HIGH (ref 3.0–12.0)
Neutro Abs: 7.8 10*3/uL — ABNORMAL HIGH (ref 1.4–7.7)
Neutrophils Relative %: 74.4 % (ref 43.0–77.0)
Platelets: 682 10*3/uL — ABNORMAL HIGH (ref 150.0–400.0)
RBC: 4.04 Mil/uL (ref 3.87–5.11)
RDW: 15 % (ref 11.5–15.5)
WBC: 10.5 10*3/uL (ref 4.0–10.5)

## 2020-11-03 IMAGING — CT CT ABD-PELV W/ CM
2 of 5 series · 15 of 46 positions shown, 17 images · IV contrast (OMNIPAQUE 300)
Comparison: 10/24/2020

CLINICAL DATA: Follow-up splenic laceration.  Low-grade fevers.

EXAM:
CT ABDOMEN AND PELVIS WITH CONTRAST
TECHNIQUE: Multidetector CT imaging of the abdomen and pelvis was performed
using the standard protocol following bolus administration of
intravenous contrast.
CONTRAST:  100mL OMNIPAQUE IOHEXOL 300 MG/ML  SOLN

[Series 2: abd/pel w · axial · 0.66mm/px · z∈[-490,-80]mm · 12 of 92 slices shown, 14 images]
[im 5/92  soft-tissue]
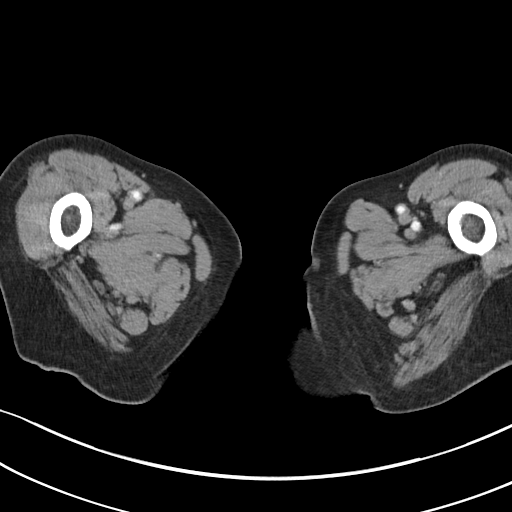
[im 5/92  bone]
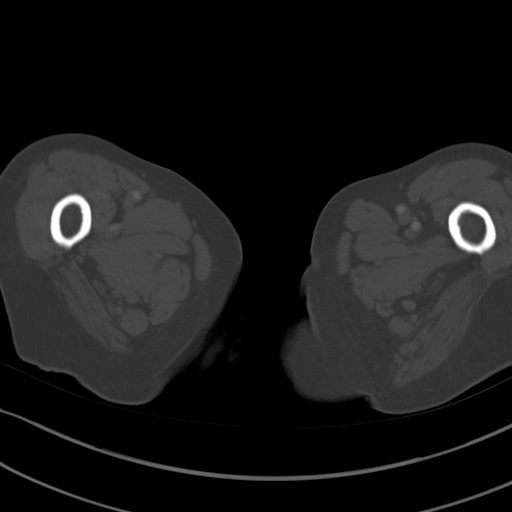
[im 14/92  soft-tissue]
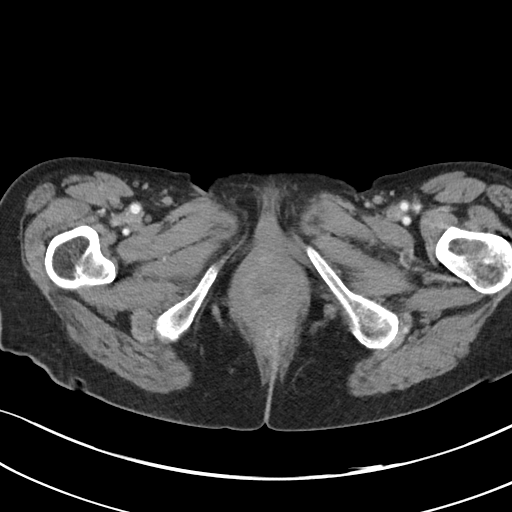
[im 19/92  soft-tissue]
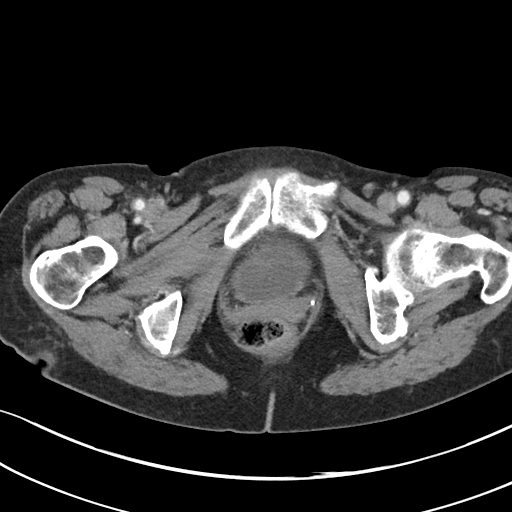
[im 28/92  soft-tissue]
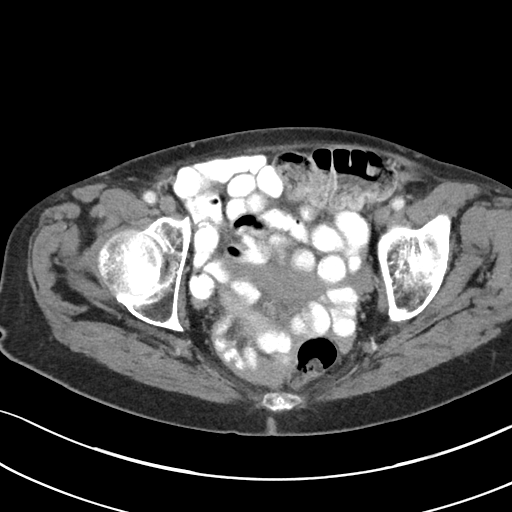
[im 37/92  soft-tissue]
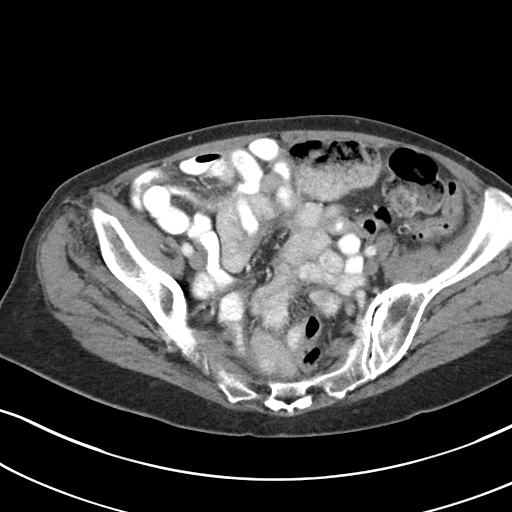
[im 41/92  soft-tissue]
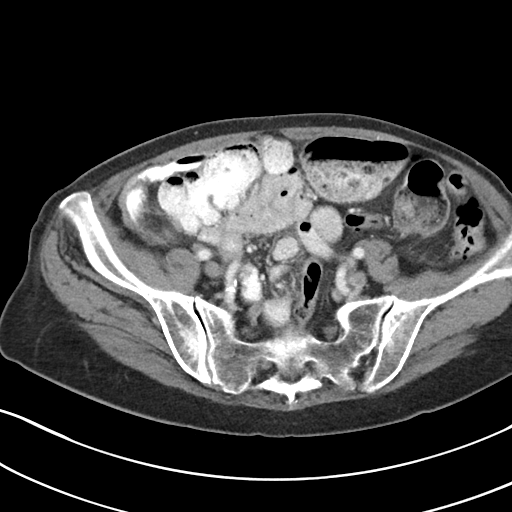
[im 51/92  soft-tissue]
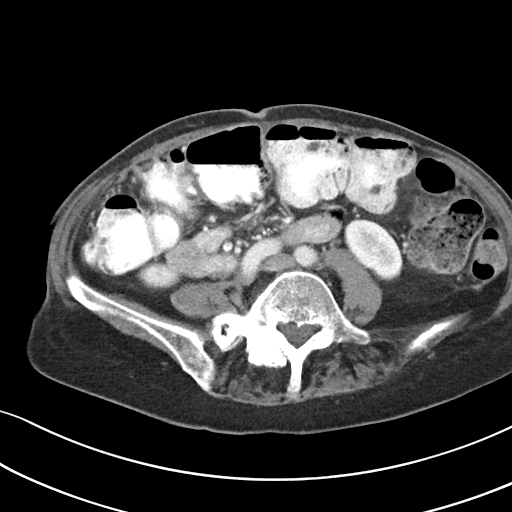
[im 55/92  soft-tissue]
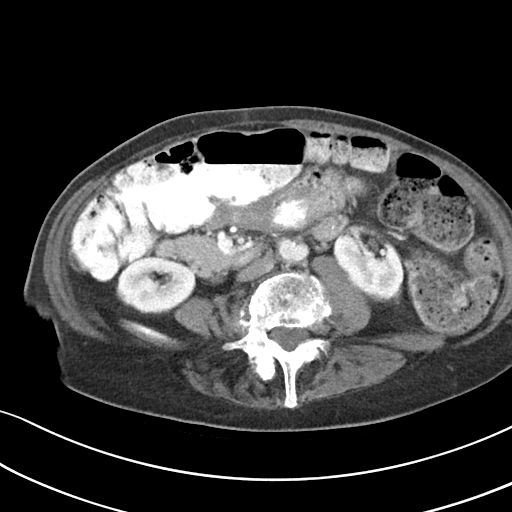
[im 64/92  soft-tissue]
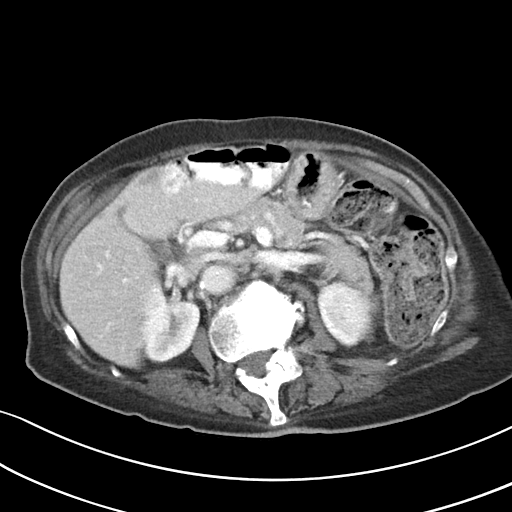
[im 64/92  bone]
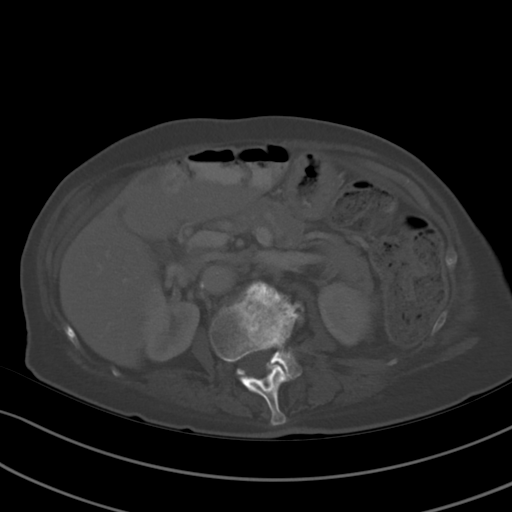
[im 73/92  soft-tissue]
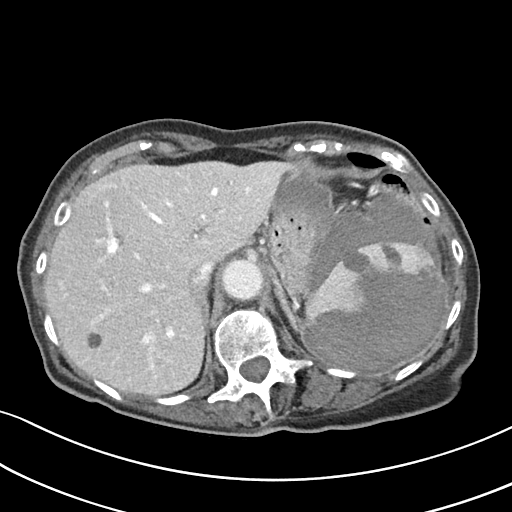
[im 78/92  soft-tissue]
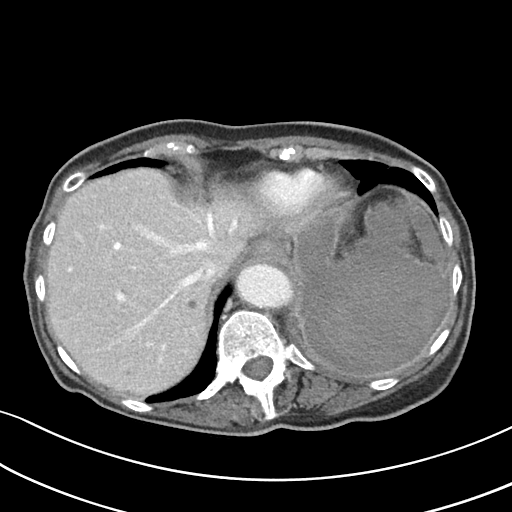
[im 87/92  soft-tissue]
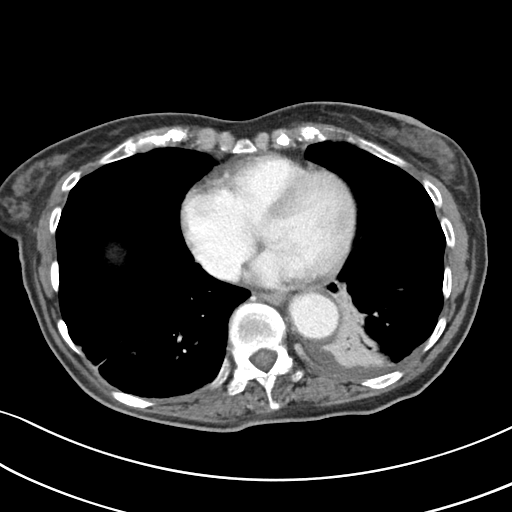

[Series 5: coronal st · coronal · 0.68mm/px · 3 of 71 slices shown]
[im 24/71  soft-tissue]
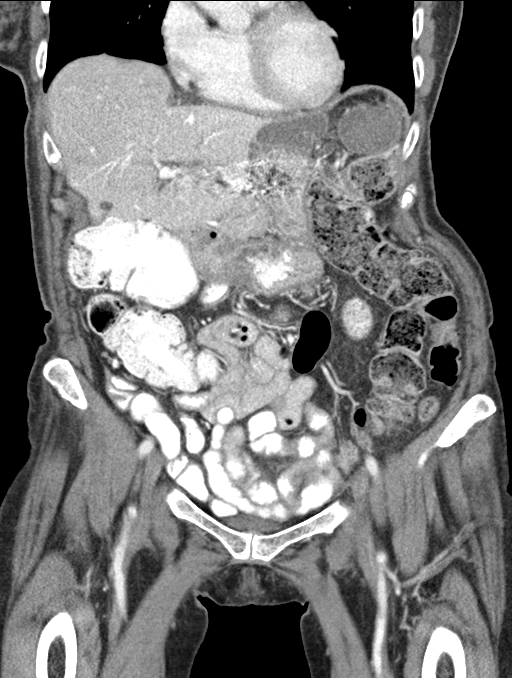
[im 32/71  soft-tissue]
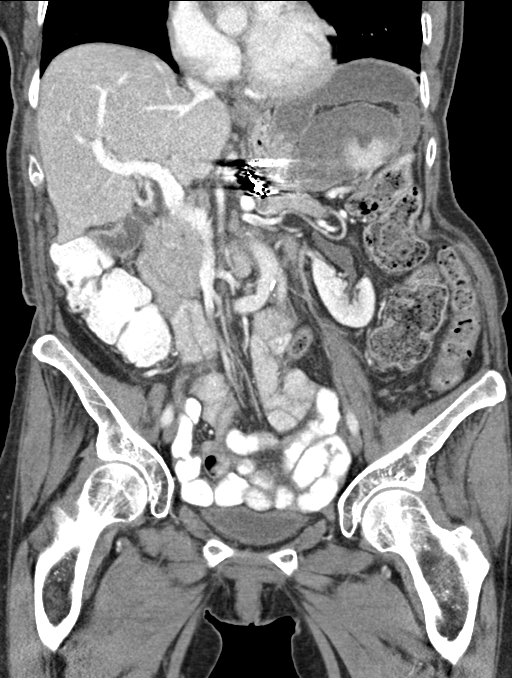
[im 39/71  soft-tissue]
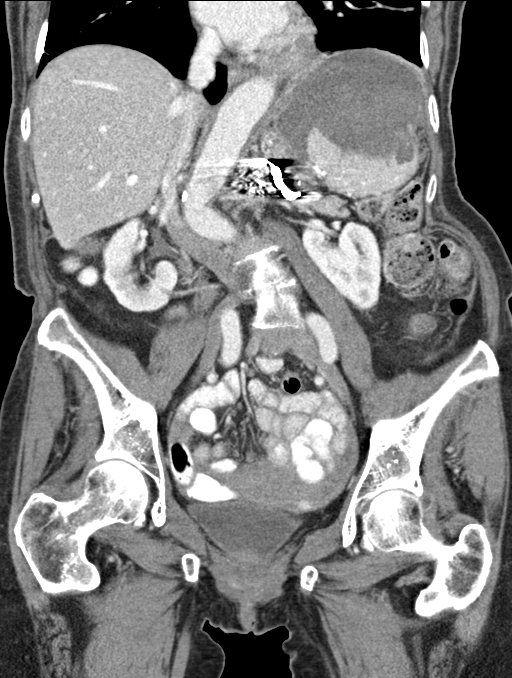

[15 of 46 positions shown; findings below may reference images not displayed]

FINDINGS: Lower Chest: Tiny left pleural effusion and left lower lobe
atelectasis again noted.

Hepatobiliary: Several small hepatic cysts remains stable. No
hepatic masses identified. Gallbladder is unremarkable. No evidence
of biliary ductal dilatation.

Pancreas:  No mass or inflammatory changes.

Spleen: Embolization coils are seen in the proximal splenic artery
which are new since previous study. Persistent enhancing splenic
tissue is seen with several peripheral areas of non enhancement
which are new since previous study, and may represent infarcts from
recent embolization. A perisplenic and left subdiaphragmatic mixed
attenuation fluid collection has increased since previous study,
currently measuring 12.5 x 11.1 cm, compared to 9.1 x 7.6 cm
previously. No evidence of active arterial extravasation of IV
contrast. Tiny amount of free fluid in the dependent pelvis noted
but decreased since prior exam.

Adrenals/Urinary Tract: No mass or parenchymal laceration
identified. No evidence of ureteral calculi or hydronephrosis. No
evidence of perinephric hematoma. Unremarkable unopacified urinary
bladder.

Stomach/Bowel: Increased mild diffuse gaseous distention of colon is
seen likely due to ileus. No evidence of bowel obstruction.

Vascular/Lymphatic: No pathologically enlarged lymph nodes. No
abdominal aortic aneurysm. Aortic atherosclerotic calcification
noted. No evidence of aortic injury.

Reproductive:  No mass or other significant abnormality.

Other:  None.

Musculoskeletal:  No suspicious bone lesions identified.
IMPRESSION: Increased size of perisplenic and left subdiaphragmatic hematoma
since previous study. No evidence of active arterial extravasation
contrast.

Interval splenic artery embolization. Persistent enhancement of
spleen seen, with new focal areas of non-enhancement more likely
representing interval infarcts than lacerations.

Mild colonic ileus pattern.

No significant change in tiny left pleural effusion and left lower
lobe atelectasis.

Aortic Atherosclerosis ([id number]-[id number].T).

These results were called by telephone at the time of interpretation
on 11/04/2020 at [DATE] to provider DR. DEANN GEBHARDT , who verbally
acknowledged these results.

## 2020-11-03 MED ORDER — IOHEXOL 300 MG/ML  SOLN
100.0000 mL | Freq: Once | INTRAMUSCULAR | Status: AC | PRN
  Administered 2020-11-03: 100 mL via INTRAVENOUS

## 2020-11-03 NOTE — Telephone Encounter (Signed)
Patient notified of the new recommendations.  Tara Bridges is out of town and patient has a friend that will help her today.  Patient verbalized understanding to come pick up contrast and the CT instructions by 12:00 here at Dr. Celesta Aver office.  Our lab does not have the blood culture bottles and patient will need to go to [place] lab at [address] for lab work.  She is asked to please call [phone] to make an appointment.  She is notified to arrive at 1:45 today for CT at 2:00 at [address]  3rd floor.  Patient verbalized and read back all information.

## 2020-11-03 NOTE — Telephone Encounter (Signed)
Discussed with Dr. Carlean Purl that patient is not able to get labs drawn for 3 days at [facility] and our lab does not have culture bottles available.  Per Dr. Carlean Purl D/C culture orders.  Patient asked to go to basement lab at [facility] today and then CT today.  Patient here in the lobby and she and her driver verbalized understanding in changes to plan of care. They are advised that we will call with CT results when available.

## 2020-11-03 NOTE — Telephone Encounter (Signed)
Persistent fevers and night sweats since embolization of spleen about 2 weeks ago.  Spoke to daughter about this.  Tara Bridges needs the following:  1) Labs - CBC + diff, CMET, blood cx x 2 dx fever  2) CT abd/pelvis with contrast re fever after splenic laceration  Please call daughter Benjamine Mola about this

## 2020-11-04 NOTE — Telephone Encounter (Signed)
Pt is requesting a call back from a nurse regarding her CT scan report

## 2020-11-04 NOTE — Telephone Encounter (Signed)
I called the patient and she is much better.  She had eaten scrambled eggs and toast before having an urge to defecate and vomiting this morning and was quite weak for a little while afterwards but is out of the bed and down stairs in her house.  She is tolerating liquids and solid food had chicken salad and cantaloupe for lunch.  No pain no more fevers.  She has continued to have night sweats as noted previously.  I think that is most likely related to having her spleen embolized and the hematoma being resorbed.  No need for blood cultures at this time.  She queried whether she needed a stool culture because of a retired orthopedist friend's recommendation but I do not think so her stool today was formed actually.  Do not think this is an infectious issue.  She is not yet taking her blood pressure medications and have asked her to check her blood pressure.  We will follow up again tomorrow.

## 2020-11-04 NOTE — Telephone Encounter (Signed)
Patient calling and asking if she should have some stool studies to investigate the continued loose stool?  She does report that she is feeling better this pm.  No further fevers today and no additional vomiting.  See results notes for additional details from this am.

## 2020-11-05 ENCOUNTER — Telehealth: Payer: Self-pay | Admitting: Internal Medicine

## 2020-11-05 NOTE — Telephone Encounter (Signed)
I called Tara Bridges and she reports she is doing well today.  Had night sweats last night as usual but no fevers and has not taken any Tylenol.  She is trying to make it to a hair appointment today.  She has follow-up on March 8

## 2020-11-11 ENCOUNTER — Other Ambulatory Visit (INDEPENDENT_AMBULATORY_CARE_PROVIDER_SITE_OTHER): Payer: PPO

## 2020-11-11 ENCOUNTER — Ambulatory Visit: Payer: PPO | Admitting: Internal Medicine

## 2020-11-11 ENCOUNTER — Encounter: Payer: Self-pay | Admitting: Internal Medicine

## 2020-11-11 VITALS — BP 100/70 | HR 64 | Ht 66.0 in | Wt 124.8 lb

## 2020-11-11 DIAGNOSIS — R634 Abnormal weight loss: Secondary | ICD-10-CM

## 2020-11-11 DIAGNOSIS — D62 Acute posthemorrhagic anemia: Secondary | ICD-10-CM

## 2020-11-11 DIAGNOSIS — S36039D Unspecified laceration of spleen, subsequent encounter: Secondary | ICD-10-CM

## 2020-11-11 DIAGNOSIS — R61 Generalized hyperhidrosis: Secondary | ICD-10-CM

## 2020-11-11 LAB — COMPREHENSIVE METABOLIC PANEL
ALT: 17 U/L (ref 0–35)
AST: 13 U/L (ref 0–37)
Albumin: 3.9 g/dL (ref 3.5–5.2)
Alkaline Phosphatase: 78 U/L (ref 39–117)
BUN: 18 mg/dL (ref 6–23)
CO2: 31 mEq/L (ref 19–32)
Calcium: 10.1 mg/dL (ref 8.4–10.5)
Chloride: 99 mEq/L (ref 96–112)
Creatinine, Ser: 0.96 mg/dL (ref 0.40–1.20)
GFR: 55.01 mL/min — ABNORMAL LOW (ref >60.00)
Glucose, Bld: 100 mg/dL — ABNORMAL HIGH (ref 70–99)
Potassium: 4.8 mEq/L (ref 3.5–5.1)
Sodium: 135 mEq/L (ref 135–145)
Total Bilirubin: 0.5 mg/dL (ref 0.2–1.2)
Total Protein: 8 g/dL (ref 6.0–8.3)

## 2020-11-11 LAB — CBC WITH DIFFERENTIAL/PLATELET
Basophils Absolute: 0 10*3/uL (ref 0.0–0.1)
Basophils Relative: 0.3 % (ref 0.0–3.0)
Eosinophils Absolute: 0.2 10*3/uL (ref 0.0–0.7)
Eosinophils Relative: 2.1 % (ref 0.0–5.0)
HCT: 35.6 % — ABNORMAL LOW (ref 36.0–46.0)
Hemoglobin: 12 g/dL (ref 12.0–15.0)
Lymphocytes Relative: 15.7 % (ref 12.0–46.0)
Lymphs Abs: 1.4 10*3/uL (ref 0.7–4.0)
MCHC: 33.9 g/dL (ref 30.0–36.0)
MCV: 89.7 fl (ref 78.0–100.0)
Monocytes Absolute: 1 10*3/uL (ref 0.1–1.0)
Monocytes Relative: 10.7 % (ref 3.0–12.0)
Neutro Abs: 6.5 10*3/uL (ref 1.4–7.7)
Neutrophils Relative %: 71.2 % (ref 43.0–77.0)
Platelets: 722 10*3/uL — ABNORMAL HIGH (ref 150.0–400.0)
RBC: 3.97 Mil/uL (ref 3.87–5.11)
RDW: 14.7 % (ref 11.5–15.5)
WBC: 9.1 10*3/uL (ref 4.0–10.5)
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
## 2020-11-11 NOTE — Patient Instructions (Signed)

## 2020-11-18 ENCOUNTER — Telehealth: Payer: Self-pay | Admitting: Internal Medicine

## 2020-11-18 NOTE — Telephone Encounter (Signed)
Please advise, thank you.

## 2020-11-18 NOTE — Telephone Encounter (Signed)
Pt's daughter is requesting a call back from a nurse to ask if the pt should still be on Statin since her platelet count is high.

## 2020-11-18 NOTE — Telephone Encounter (Signed)
Statin is for cholesterol - it is not essential  I do not think it is related t the high platelet count - statins may cause low platelets  So do not stop

## 2020-11-18 NOTE — Telephone Encounter (Signed)
I left a detailed message for her daughter [name] with Dr Celesta Aver message.

## 2020-11-19 ENCOUNTER — Telehealth: Payer: Self-pay

## 2020-11-19 NOTE — Telephone Encounter (Signed)
Please schedule patient for office visit with me Friday or next week.

## 2020-11-19 NOTE — Telephone Encounter (Signed)
From pt

## 2020-11-21 ENCOUNTER — Ambulatory Visit: Payer: PPO | Admitting: Student
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
## 2020-11-27 ENCOUNTER — Other Ambulatory Visit: Payer: Self-pay

## 2020-11-27 ENCOUNTER — Encounter: Payer: Self-pay | Admitting: Student

## 2020-11-27 ENCOUNTER — Ambulatory Visit: Payer: PPO | Admitting: Student

## 2020-11-27 VITALS — BP 149/78 | HR 80 | Temp 97.9°F | Ht 66.0 in | Wt 126.0 lb

## 2020-11-27 DIAGNOSIS — I2510 Atherosclerotic heart disease of native coronary artery without angina pectoris: Secondary | ICD-10-CM

## 2020-11-27 DIAGNOSIS — Z9081 Acquired absence of spleen: Secondary | ICD-10-CM | POA: Diagnosis not present

## 2020-11-27 DIAGNOSIS — E7800 Pure hypercholesterolemia, unspecified: Secondary | ICD-10-CM

## 2020-11-27 DIAGNOSIS — R0989 Other specified symptoms and signs involving the circulatory and respiratory systems: Secondary | ICD-10-CM

## 2020-11-27 DIAGNOSIS — I10 Essential (primary) hypertension: Secondary | ICD-10-CM

## 2020-11-27 MED ORDER — ATORVASTATIN CALCIUM 10 MG PO TABS: 10.0000 mg | ORAL_TABLET | ORAL | 3 refills | Status: DC

## 2020-11-27 MED ORDER — LISINOPRIL 10 MG PO TABS
10.0000 mg | ORAL_TABLET | Freq: Every day | ORAL | Status: AC
Start: 2020-11-27 — End: ?

## 2020-11-28 ENCOUNTER — Telehealth: Payer: Self-pay | Admitting: Internal Medicine

## 2020-11-28 NOTE — Telephone Encounter (Signed)
Tara Bridges has been rescheduled.

## 2020-11-28 NOTE — Telephone Encounter (Signed)
Inbound call from patient requesting to reschedule her appt scheduled for 12/03/20.  Informed patient next availability will be in May.  Requesting to speak with CMA; wanting sooner appt.  Please advise.

## 2020-12-03 ENCOUNTER — Ambulatory Visit: Payer: PPO | Admitting: Internal Medicine

## 2020-12-08 ENCOUNTER — Ambulatory Visit: Payer: PPO | Admitting: Internal Medicine

## 2020-12-08 ENCOUNTER — Encounter: Payer: Self-pay | Admitting: Internal Medicine

## 2020-12-08 ENCOUNTER — Other Ambulatory Visit (INDEPENDENT_AMBULATORY_CARE_PROVIDER_SITE_OTHER): Payer: PPO

## 2020-12-08 VITALS — BP 126/68 | HR 76 | Ht 65.5 in | Wt 128.1 lb

## 2020-12-08 DIAGNOSIS — S36039D Unspecified laceration of spleen, subsequent encounter: Secondary | ICD-10-CM

## 2020-12-08 DIAGNOSIS — R61 Generalized hyperhidrosis: Secondary | ICD-10-CM | POA: Diagnosis not present

## 2020-12-08 DIAGNOSIS — R195 Other fecal abnormalities: Secondary | ICD-10-CM | POA: Diagnosis not present

## 2020-12-08 DIAGNOSIS — D75839 Thrombocytosis, unspecified: Secondary | ICD-10-CM

## 2020-12-08 LAB — CBC WITH DIFFERENTIAL/PLATELET
Basophils Absolute: 0.1 10*3/uL (ref 0.0–0.1)
Basophils Relative: 0.8 % (ref 0.0–3.0)
Eosinophils Absolute: 0.2 10*3/uL (ref 0.0–0.7)
Eosinophils Relative: 2.6 % (ref 0.0–5.0)
HCT: 37.2 % (ref 36.0–46.0)
Hemoglobin: 12.2 g/dL (ref 12.0–15.0)
Lymphocytes Relative: 25.4 % (ref 12.0–46.0)
Lymphs Abs: 1.7 10*3/uL (ref 0.7–4.0)
MCHC: 32.9 g/dL (ref 30.0–36.0)
MCV: 91.8 fl (ref 78.0–100.0)
Monocytes Absolute: 0.7 10*3/uL (ref 0.1–1.0)
Monocytes Relative: 10 % (ref 3.0–12.0)
Neutro Abs: 4.1 10*3/uL (ref 1.4–7.7)
Neutrophils Relative %: 61.2 % (ref 43.0–77.0)
Platelets: 316 10*3/uL (ref 150.0–400.0)
RBC: 4.05 Mil/uL (ref 3.87–5.11)
RDW: 15.7 % — ABNORMAL HIGH (ref 11.5–15.5)
WBC: 6.6 10*3/uL (ref 4.0–10.5)

## 2020-12-08 NOTE — Patient Instructions (Signed)
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
## 2020-12-09 LAB — PATHOLOGIST SMEAR REVIEW

## 2020-12-15 DIAGNOSIS — E063 Autoimmune thyroiditis: Secondary | ICD-10-CM | POA: Diagnosis not present

## 2020-12-15 DIAGNOSIS — N951 Menopausal and female climacteric states: Secondary | ICD-10-CM | POA: Diagnosis not present

## 2020-12-15 DIAGNOSIS — E559 Vitamin D deficiency, unspecified: Secondary | ICD-10-CM | POA: Diagnosis not present

## 2020-12-15 DIAGNOSIS — E8881 Metabolic syndrome: Secondary | ICD-10-CM | POA: Diagnosis not present

## 2020-12-15 DIAGNOSIS — E509 Vitamin A deficiency, unspecified: Secondary | ICD-10-CM | POA: Diagnosis not present

## 2020-12-15 DIAGNOSIS — E7212 Methylenetetrahydrofolate reductase deficiency: Secondary | ICD-10-CM | POA: Diagnosis not present

## 2020-12-15 DIAGNOSIS — R5383 Other fatigue: Secondary | ICD-10-CM | POA: Diagnosis not present

## 2020-12-15 DIAGNOSIS — E279 Disorder of adrenal gland, unspecified: Secondary | ICD-10-CM | POA: Diagnosis not present

## 2020-12-15 DIAGNOSIS — E782 Mixed hyperlipidemia: Secondary | ICD-10-CM | POA: Diagnosis not present

## 2020-12-15 DIAGNOSIS — E7841 Elevated Lipoprotein(a): Secondary | ICD-10-CM | POA: Diagnosis not present

## 2020-12-15 DIAGNOSIS — R7982 Elevated C-reactive protein (CRP): Secondary | ICD-10-CM | POA: Diagnosis not present

## 2020-12-15 DIAGNOSIS — E039 Hypothyroidism, unspecified: Secondary | ICD-10-CM | POA: Diagnosis not present

## 2020-12-17 ENCOUNTER — Other Ambulatory Visit: Payer: PPO

## 2020-12-23 DIAGNOSIS — E785 Hyperlipidemia, unspecified: Secondary | ICD-10-CM | POA: Diagnosis not present

## 2020-12-23 DIAGNOSIS — D62 Acute posthemorrhagic anemia: Secondary | ICD-10-CM | POA: Diagnosis not present

## 2020-12-23 DIAGNOSIS — S36039D Unspecified laceration of spleen, subsequent encounter: Secondary | ICD-10-CM | POA: Diagnosis not present

## 2020-12-23 DIAGNOSIS — K5730 Diverticulosis of large intestine without perforation or abscess without bleeding: Secondary | ICD-10-CM | POA: Diagnosis not present

## 2020-12-23 DIAGNOSIS — K635 Polyp of colon: Secondary | ICD-10-CM | POA: Diagnosis not present

## 2020-12-23 DIAGNOSIS — I6523 Occlusion and stenosis of bilateral carotid arteries: Secondary | ICD-10-CM | POA: Diagnosis not present

## 2020-12-23 DIAGNOSIS — I679 Cerebrovascular disease, unspecified: Secondary | ICD-10-CM | POA: Diagnosis not present

## 2020-12-23 DIAGNOSIS — E871 Hypo-osmolality and hyponatremia: Secondary | ICD-10-CM | POA: Diagnosis not present

## 2020-12-23 DIAGNOSIS — I129 Hypertensive chronic kidney disease with stage 1 through stage 4 chronic kidney disease, or unspecified chronic kidney disease: Secondary | ICD-10-CM | POA: Diagnosis not present

## 2020-12-23 DIAGNOSIS — E039 Hypothyroidism, unspecified: Secondary | ICD-10-CM | POA: Diagnosis not present

## 2020-12-23 DIAGNOSIS — N1831 Chronic kidney disease, stage 3a: Secondary | ICD-10-CM | POA: Diagnosis not present

## 2020-12-23 DIAGNOSIS — I2510 Atherosclerotic heart disease of native coronary artery without angina pectoris: Secondary | ICD-10-CM | POA: Diagnosis not present

## 2021-01-21 ENCOUNTER — Ambulatory Visit: Payer: PPO | Admitting: Student

## 2021-03-24 DIAGNOSIS — E785 Hyperlipidemia, unspecified: Secondary | ICD-10-CM | POA: Diagnosis not present

## 2021-03-24 DIAGNOSIS — K635 Polyp of colon: Secondary | ICD-10-CM | POA: Diagnosis not present

## 2021-03-24 DIAGNOSIS — E871 Hypo-osmolality and hyponatremia: Secondary | ICD-10-CM | POA: Diagnosis not present

## 2021-03-24 DIAGNOSIS — I129 Hypertensive chronic kidney disease with stage 1 through stage 4 chronic kidney disease, or unspecified chronic kidney disease: Secondary | ICD-10-CM | POA: Diagnosis not present

## 2021-03-24 DIAGNOSIS — I6523 Occlusion and stenosis of bilateral carotid arteries: Secondary | ICD-10-CM | POA: Diagnosis not present

## 2021-03-24 DIAGNOSIS — I2510 Atherosclerotic heart disease of native coronary artery without angina pectoris: Secondary | ICD-10-CM | POA: Diagnosis not present

## 2021-03-24 DIAGNOSIS — E039 Hypothyroidism, unspecified: Secondary | ICD-10-CM | POA: Diagnosis not present

## 2021-03-24 DIAGNOSIS — I700 Atherosclerosis of aorta: Secondary | ICD-10-CM | POA: Diagnosis not present

## 2021-03-24 DIAGNOSIS — N1831 Chronic kidney disease, stage 3a: Secondary | ICD-10-CM | POA: Diagnosis not present

## 2021-03-24 DIAGNOSIS — D62 Acute posthemorrhagic anemia: Secondary | ICD-10-CM | POA: Diagnosis not present

## 2021-03-24 DIAGNOSIS — M8580 Other specified disorders of bone density and structure, unspecified site: Secondary | ICD-10-CM | POA: Diagnosis not present

## 2021-03-24 DIAGNOSIS — E559 Vitamin D deficiency, unspecified: Secondary | ICD-10-CM | POA: Diagnosis not present

## 2021-04-16 DIAGNOSIS — R946 Abnormal results of thyroid function studies: Secondary | ICD-10-CM | POA: Diagnosis not present

## 2021-04-16 DIAGNOSIS — E063 Autoimmune thyroiditis: Secondary | ICD-10-CM | POA: Diagnosis not present

## 2021-04-16 DIAGNOSIS — E039 Hypothyroidism, unspecified: Secondary | ICD-10-CM | POA: Diagnosis not present

## 2021-04-16 DIAGNOSIS — G933 Postviral fatigue syndrome: Secondary | ICD-10-CM | POA: Diagnosis not present

## 2021-04-16 DIAGNOSIS — E7841 Elevated Lipoprotein(a): Secondary | ICD-10-CM | POA: Diagnosis not present

## 2021-04-16 DIAGNOSIS — E617 Deficiency of multiple nutrient elements: Secondary | ICD-10-CM | POA: Diagnosis not present

## 2021-04-16 DIAGNOSIS — N951 Menopausal and female climacteric states: Secondary | ICD-10-CM | POA: Diagnosis not present

## 2021-04-16 DIAGNOSIS — R5383 Other fatigue: Secondary | ICD-10-CM | POA: Diagnosis not present

## 2021-04-16 DIAGNOSIS — B349 Viral infection, unspecified: Secondary | ICD-10-CM | POA: Diagnosis not present

## 2021-04-30 ENCOUNTER — Ambulatory Visit: Payer: PPO | Admitting: Podiatry

## 2021-04-30 ENCOUNTER — Other Ambulatory Visit: Payer: Self-pay

## 2021-04-30 ENCOUNTER — Encounter: Payer: Self-pay | Admitting: Podiatry

## 2021-04-30 DIAGNOSIS — L600 Ingrowing nail: Secondary | ICD-10-CM | POA: Diagnosis not present

## 2021-04-30 DIAGNOSIS — M2041 Other hammer toe(s) (acquired), right foot: Secondary | ICD-10-CM

## 2021-04-30 NOTE — Progress Notes (Signed)
Subjective:   Patient ID: Tara Bridges, female   DOB: 85 y.o.   MRN: [id number]   HPI Patient presents with a painful ingrown toenail of the right big toe lateral border that is very sore and making it hard to walk and it is irritated for her.  She is tried to trim it herself.  Patient has good digital perfusion and she does not smoke likes to be active with digital deformity second right   Review of Systems  All other systems reviewed and are negative.      Objective:  Physical Exam Vitals and nursing note reviewed.  Constitutional:      Appearance: She is well-developed.  Pulmonary:     Effort: Pulmonary effort is normal.  Musculoskeletal:        General: Normal range of motion.  Skin:    General: Skin is warm.  Neurological:     Mental Status: She is alert.    Neurovascular status intact muscle strength adequate range of motion within normal limits with incurvated lateral border right hallux very painful when pressed distal irritation of the tissue no redness no active drainage noted with good digital perfusion well oriented with moderate elevation second toe right rigid     Assessment:  Ingrown toenail deformity right hallux lateral border with pain along with digital deformity second right     Plan:  H&P reviewed condition recommended correction of ingrown toenail explained procedure risk and today I infiltrated the right hallux 60 mg like Marcaine mixture sterile prep removed the lateral border exposed matrix applied phenol 3 applications 30 seconds followed by alcohol lavage sterile dressing gave instructions on soaks encouraged her to leave dressing on 24 hours but take it off earlier if any throbbing were to occur and to call with questions and do not recommend surgery for the second digit right educating her on hammertoes today

## 2021-04-30 NOTE — Patient Instructions (Signed)

## 2021-05-01 ENCOUNTER — Telehealth: Payer: Self-pay | Admitting: *Deleted

## 2021-05-01 NOTE — Telephone Encounter (Signed)
Patient is calling with questions about soaking and applying ointment after nail procedure.  Returned the call back to patient to give verbal instructions, no answer,left vmessage for call back.

## 2021-05-07 ENCOUNTER — Ambulatory Visit (INDEPENDENT_AMBULATORY_CARE_PROVIDER_SITE_OTHER): Payer: PPO | Admitting: Podiatry

## 2021-05-07 ENCOUNTER — Encounter: Payer: Self-pay | Admitting: Podiatry

## 2021-05-07 ENCOUNTER — Other Ambulatory Visit: Payer: Self-pay

## 2021-05-07 ENCOUNTER — Telehealth: Payer: Self-pay | Admitting: *Deleted

## 2021-05-07 DIAGNOSIS — L03031 Cellulitis of right toe: Secondary | ICD-10-CM

## 2021-05-07 MED ORDER — DOXYCYCLINE HYCLATE 100 MG PO TABS: 100.0000 mg | ORAL_TABLET | Freq: Two times a day (BID) | ORAL | 1 refills | Status: AC

## 2021-05-07 NOTE — Telephone Encounter (Signed)
Patient is calling because her procedural toe done about 1 week ago is red, very sore, has been soaking as instructed, possible infected. Please advise.

## 2021-05-07 NOTE — Progress Notes (Signed)
Subjective:   Patient ID: Tara Bridges, female   DOB: 85 y.o.   MRN: [id number]   HPI Patient presents concerned because there is some redness around her big toe and she states that she was soaking it and using bandages and it is mildly tender near   ROS      Objective:  Physical Exam  Neurovascular status intact with patient found to have low-grade erythema around the right hallux lateral side localized no active drainage crusted tissue no proximal edema erythema drainage noted     Assessment:  Low-grade paronychia infection right hallux     Plan:  H&P discussed soaks and continue bandage usage and as precautionary measure placed on doxycycline twice daily and gave strict instructions if increased redness or anything else were to occur to let us know immediately

## 2021-05-27 DIAGNOSIS — E039 Hypothyroidism, unspecified: Secondary | ICD-10-CM | POA: Diagnosis not present

## 2021-05-27 DIAGNOSIS — R29890 Loss of height: Secondary | ICD-10-CM | POA: Diagnosis not present

## 2021-05-27 DIAGNOSIS — M8588 Other specified disorders of bone density and structure, other site: Secondary | ICD-10-CM | POA: Diagnosis not present

## 2021-05-27 DIAGNOSIS — Z6820 Body mass index (BMI) 20.0-20.9, adult: Secondary | ICD-10-CM | POA: Diagnosis not present

## 2021-05-27 DIAGNOSIS — Z01419 Encounter for gynecological examination (general) (routine) without abnormal findings: Secondary | ICD-10-CM | POA: Diagnosis not present

## 2021-05-27 DIAGNOSIS — N958 Other specified menopausal and perimenopausal disorders: Secondary | ICD-10-CM | POA: Diagnosis not present

## 2021-05-27 DIAGNOSIS — Z124 Encounter for screening for malignant neoplasm of cervix: Secondary | ICD-10-CM | POA: Diagnosis not present

## 2021-06-10 DIAGNOSIS — R300 Dysuria: Secondary | ICD-10-CM | POA: Diagnosis not present

## 2021-06-26 DIAGNOSIS — B2790 Infectious mononucleosis, unspecified without complication: Secondary | ICD-10-CM | POA: Diagnosis not present

## 2021-06-26 DIAGNOSIS — E039 Hypothyroidism, unspecified: Secondary | ICD-10-CM | POA: Diagnosis not present

## 2021-06-26 DIAGNOSIS — G9339 Other post infection and related fatigue syndromes: Secondary | ICD-10-CM | POA: Diagnosis not present

## 2021-07-04 DIAGNOSIS — Z23 Encounter for immunization: Secondary | ICD-10-CM | POA: Diagnosis not present

## 2021-07-09 DIAGNOSIS — R300 Dysuria: Secondary | ICD-10-CM | POA: Diagnosis not present

## 2021-07-09 DIAGNOSIS — N390 Urinary tract infection, site not specified: Secondary | ICD-10-CM | POA: Diagnosis not present

## 2021-08-10 DIAGNOSIS — R2689 Other abnormalities of gait and mobility: Secondary | ICD-10-CM | POA: Diagnosis not present

## 2021-08-10 DIAGNOSIS — M47816 Spondylosis without myelopathy or radiculopathy, lumbar region: Secondary | ICD-10-CM | POA: Diagnosis not present

## 2021-08-10 DIAGNOSIS — R531 Weakness: Secondary | ICD-10-CM | POA: Diagnosis not present

## 2021-08-10 DIAGNOSIS — M2170 Unequal limb length (acquired), unspecified site: Secondary | ICD-10-CM | POA: Diagnosis not present

## 2021-08-10 DIAGNOSIS — M419 Scoliosis, unspecified: Secondary | ICD-10-CM | POA: Diagnosis not present

## 2021-08-10 DIAGNOSIS — M5451 Vertebrogenic low back pain: Secondary | ICD-10-CM | POA: Diagnosis not present

## 2021-08-13 DIAGNOSIS — H26491 Other secondary cataract, right eye: Secondary | ICD-10-CM | POA: Diagnosis not present

## 2021-08-13 DIAGNOSIS — Z961 Presence of intraocular lens: Secondary | ICD-10-CM | POA: Diagnosis not present

## 2021-08-13 DIAGNOSIS — H43391 Other vitreous opacities, right eye: Secondary | ICD-10-CM | POA: Diagnosis not present

## 2021-08-13 DIAGNOSIS — H353121 Nonexudative age-related macular degeneration, left eye, early dry stage: Secondary | ICD-10-CM | POA: Diagnosis not present

## 2021-08-24 DIAGNOSIS — M419 Scoliosis, unspecified: Secondary | ICD-10-CM | POA: Diagnosis not present

## 2021-08-24 DIAGNOSIS — M47816 Spondylosis without myelopathy or radiculopathy, lumbar region: Secondary | ICD-10-CM | POA: Diagnosis not present

## 2021-08-24 DIAGNOSIS — M2170 Unequal limb length (acquired), unspecified site: Secondary | ICD-10-CM | POA: Diagnosis not present

## 2021-08-24 DIAGNOSIS — R2689 Other abnormalities of gait and mobility: Secondary | ICD-10-CM | POA: Diagnosis not present

## 2021-08-24 DIAGNOSIS — M5451 Vertebrogenic low back pain: Secondary | ICD-10-CM | POA: Diagnosis not present

## 2021-08-26 DIAGNOSIS — Z85828 Personal history of other malignant neoplasm of skin: Secondary | ICD-10-CM | POA: Diagnosis not present

## 2021-08-26 DIAGNOSIS — L821 Other seborrheic keratosis: Secondary | ICD-10-CM | POA: Diagnosis not present

## 2021-08-26 DIAGNOSIS — D485 Neoplasm of uncertain behavior of skin: Secondary | ICD-10-CM | POA: Diagnosis not present

## 2021-08-26 DIAGNOSIS — D1801 Hemangioma of skin and subcutaneous tissue: Secondary | ICD-10-CM | POA: Diagnosis not present

## 2021-08-26 DIAGNOSIS — L820 Inflamed seborrheic keratosis: Secondary | ICD-10-CM | POA: Diagnosis not present

## 2021-08-26 DIAGNOSIS — C44519 Basal cell carcinoma of skin of other part of trunk: Secondary | ICD-10-CM | POA: Diagnosis not present

## 2021-08-26 DIAGNOSIS — D2372 Other benign neoplasm of skin of left lower limb, including hip: Secondary | ICD-10-CM | POA: Diagnosis not present

## 2021-09-28 DIAGNOSIS — K635 Polyp of colon: Secondary | ICD-10-CM | POA: Diagnosis not present

## 2021-09-28 DIAGNOSIS — N1831 Chronic kidney disease, stage 3a: Secondary | ICD-10-CM | POA: Diagnosis not present

## 2021-09-28 DIAGNOSIS — I2510 Atherosclerotic heart disease of native coronary artery without angina pectoris: Secondary | ICD-10-CM | POA: Diagnosis not present

## 2021-09-28 DIAGNOSIS — E039 Hypothyroidism, unspecified: Secondary | ICD-10-CM | POA: Diagnosis not present

## 2021-09-28 DIAGNOSIS — I6523 Occlusion and stenosis of bilateral carotid arteries: Secondary | ICD-10-CM | POA: Diagnosis not present

## 2021-09-28 DIAGNOSIS — I129 Hypertensive chronic kidney disease with stage 1 through stage 4 chronic kidney disease, or unspecified chronic kidney disease: Secondary | ICD-10-CM | POA: Diagnosis not present

## 2021-09-28 DIAGNOSIS — E785 Hyperlipidemia, unspecified: Secondary | ICD-10-CM | POA: Diagnosis not present

## 2021-09-28 DIAGNOSIS — I700 Atherosclerosis of aorta: Secondary | ICD-10-CM | POA: Diagnosis not present

## 2021-09-28 DIAGNOSIS — E871 Hypo-osmolality and hyponatremia: Secondary | ICD-10-CM | POA: Diagnosis not present

## 2021-09-28 DIAGNOSIS — I5189 Other ill-defined heart diseases: Secondary | ICD-10-CM | POA: Diagnosis not present

## 2021-09-28 DIAGNOSIS — I679 Cerebrovascular disease, unspecified: Secondary | ICD-10-CM | POA: Diagnosis not present

## 2021-10-05 DIAGNOSIS — M419 Scoliosis, unspecified: Secondary | ICD-10-CM | POA: Diagnosis not present

## 2021-10-05 DIAGNOSIS — M2170 Unequal limb length (acquired), unspecified site: Secondary | ICD-10-CM | POA: Diagnosis not present

## 2021-10-05 DIAGNOSIS — R531 Weakness: Secondary | ICD-10-CM | POA: Diagnosis not present

## 2021-10-05 DIAGNOSIS — M47816 Spondylosis without myelopathy or radiculopathy, lumbar region: Secondary | ICD-10-CM | POA: Diagnosis not present

## 2021-10-05 DIAGNOSIS — M5451 Vertebrogenic low back pain: Secondary | ICD-10-CM | POA: Diagnosis not present

## 2021-10-05 DIAGNOSIS — R2689 Other abnormalities of gait and mobility: Secondary | ICD-10-CM | POA: Diagnosis not present

## 2021-10-07 DIAGNOSIS — Z1231 Encounter for screening mammogram for malignant neoplasm of breast: Secondary | ICD-10-CM | POA: Diagnosis not present

## 2021-11-06 ENCOUNTER — Other Ambulatory Visit: Payer: Self-pay | Admitting: Student

## 2021-11-06 DIAGNOSIS — E7800 Pure hypercholesterolemia, unspecified: Secondary | ICD-10-CM

## 2021-11-06 DIAGNOSIS — I2510 Atherosclerotic heart disease of native coronary artery without angina pectoris: Secondary | ICD-10-CM

## 2021-11-19 DIAGNOSIS — M8580 Other specified disorders of bone density and structure, unspecified site: Secondary | ICD-10-CM | POA: Diagnosis not present

## 2021-11-19 DIAGNOSIS — E785 Hyperlipidemia, unspecified: Secondary | ICD-10-CM | POA: Diagnosis not present

## 2021-11-19 DIAGNOSIS — E038 Other specified hypothyroidism: Secondary | ICD-10-CM | POA: Diagnosis not present

## 2021-11-19 DIAGNOSIS — K5900 Constipation, unspecified: Secondary | ICD-10-CM | POA: Diagnosis not present

## 2021-11-19 DIAGNOSIS — I10 Essential (primary) hypertension: Secondary | ICD-10-CM | POA: Diagnosis not present

## 2021-11-19 DIAGNOSIS — M1990 Unspecified osteoarthritis, unspecified site: Secondary | ICD-10-CM | POA: Diagnosis not present

## 2021-11-19 DIAGNOSIS — G4700 Insomnia, unspecified: Secondary | ICD-10-CM | POA: Diagnosis not present

## 2021-11-19 DIAGNOSIS — G63 Polyneuropathy in diseases classified elsewhere: Secondary | ICD-10-CM | POA: Diagnosis not present

## 2021-11-19 DIAGNOSIS — E039 Hypothyroidism, unspecified: Secondary | ICD-10-CM | POA: Diagnosis not present

## 2021-11-19 DIAGNOSIS — G8929 Other chronic pain: Secondary | ICD-10-CM | POA: Diagnosis not present

## 2021-12-04 DIAGNOSIS — I10 Essential (primary) hypertension: Secondary | ICD-10-CM | POA: Diagnosis not present

## 2021-12-04 DIAGNOSIS — I129 Hypertensive chronic kidney disease with stage 1 through stage 4 chronic kidney disease, or unspecified chronic kidney disease: Secondary | ICD-10-CM | POA: Diagnosis not present

## 2021-12-04 DIAGNOSIS — N1831 Chronic kidney disease, stage 3a: Secondary | ICD-10-CM | POA: Diagnosis not present

## 2021-12-04 DIAGNOSIS — E039 Hypothyroidism, unspecified: Secondary | ICD-10-CM | POA: Diagnosis not present

## 2022-01-03 DIAGNOSIS — E039 Hypothyroidism, unspecified: Secondary | ICD-10-CM | POA: Diagnosis not present

## 2022-01-03 DIAGNOSIS — N1831 Chronic kidney disease, stage 3a: Secondary | ICD-10-CM | POA: Diagnosis not present

## 2022-01-03 DIAGNOSIS — I129 Hypertensive chronic kidney disease with stage 1 through stage 4 chronic kidney disease, or unspecified chronic kidney disease: Secondary | ICD-10-CM | POA: Diagnosis not present

## 2022-01-03 DIAGNOSIS — I10 Essential (primary) hypertension: Secondary | ICD-10-CM | POA: Diagnosis not present

## 2022-01-05 DIAGNOSIS — L821 Other seborrheic keratosis: Secondary | ICD-10-CM | POA: Diagnosis not present

## 2022-01-05 DIAGNOSIS — Z85828 Personal history of other malignant neoplasm of skin: Secondary | ICD-10-CM | POA: Diagnosis not present

## 2022-03-24 DIAGNOSIS — E785 Hyperlipidemia, unspecified: Secondary | ICD-10-CM | POA: Diagnosis not present

## 2022-03-24 DIAGNOSIS — I10 Essential (primary) hypertension: Secondary | ICD-10-CM | POA: Diagnosis not present

## 2022-03-24 DIAGNOSIS — E039 Hypothyroidism, unspecified: Secondary | ICD-10-CM | POA: Diagnosis not present

## 2022-03-24 DIAGNOSIS — R7989 Other specified abnormal findings of blood chemistry: Secondary | ICD-10-CM | POA: Diagnosis not present

## 2022-03-24 DIAGNOSIS — E559 Vitamin D deficiency, unspecified: Secondary | ICD-10-CM | POA: Diagnosis not present

## 2022-03-29 DIAGNOSIS — N390 Urinary tract infection, site not specified: Secondary | ICD-10-CM | POA: Diagnosis not present

## 2022-03-29 DIAGNOSIS — R300 Dysuria: Secondary | ICD-10-CM | POA: Diagnosis not present

## 2022-03-31 DIAGNOSIS — I679 Cerebrovascular disease, unspecified: Secondary | ICD-10-CM | POA: Diagnosis not present

## 2022-03-31 DIAGNOSIS — E785 Hyperlipidemia, unspecified: Secondary | ICD-10-CM | POA: Diagnosis not present

## 2022-03-31 DIAGNOSIS — E559 Vitamin D deficiency, unspecified: Secondary | ICD-10-CM | POA: Diagnosis not present

## 2022-03-31 DIAGNOSIS — I2510 Atherosclerotic heart disease of native coronary artery without angina pectoris: Secondary | ICD-10-CM | POA: Diagnosis not present

## 2022-03-31 DIAGNOSIS — I5189 Other ill-defined heart diseases: Secondary | ICD-10-CM | POA: Diagnosis not present

## 2022-03-31 DIAGNOSIS — Z1331 Encounter for screening for depression: Secondary | ICD-10-CM | POA: Diagnosis not present

## 2022-03-31 DIAGNOSIS — M8580 Other specified disorders of bone density and structure, unspecified site: Secondary | ICD-10-CM | POA: Diagnosis not present

## 2022-03-31 DIAGNOSIS — Z1389 Encounter for screening for other disorder: Secondary | ICD-10-CM | POA: Diagnosis not present

## 2022-03-31 DIAGNOSIS — R7982 Elevated C-reactive protein (CRP): Secondary | ICD-10-CM | POA: Diagnosis not present

## 2022-03-31 DIAGNOSIS — Z0000 Encounter for general adult medical examination without abnormal findings: Secondary | ICD-10-CM | POA: Diagnosis not present

## 2022-03-31 DIAGNOSIS — I129 Hypertensive chronic kidney disease with stage 1 through stage 4 chronic kidney disease, or unspecified chronic kidney disease: Secondary | ICD-10-CM | POA: Diagnosis not present

## 2022-03-31 DIAGNOSIS — E039 Hypothyroidism, unspecified: Secondary | ICD-10-CM | POA: Diagnosis not present

## 2022-04-22 DIAGNOSIS — N390 Urinary tract infection, site not specified: Secondary | ICD-10-CM | POA: Diagnosis not present

## 2022-05-31 DIAGNOSIS — Z01419 Encounter for gynecological examination (general) (routine) without abnormal findings: Secondary | ICD-10-CM | POA: Diagnosis not present

## 2022-05-31 DIAGNOSIS — Z6821 Body mass index (BMI) 21.0-21.9, adult: Secondary | ICD-10-CM | POA: Diagnosis not present

## 2022-05-31 DIAGNOSIS — N390 Urinary tract infection, site not specified: Secondary | ICD-10-CM | POA: Diagnosis not present

## 2022-05-31 DIAGNOSIS — R5383 Other fatigue: Secondary | ICD-10-CM | POA: Diagnosis not present

## 2022-07-03 DIAGNOSIS — Z23 Encounter for immunization: Secondary | ICD-10-CM | POA: Diagnosis not present

## 2022-09-20 DIAGNOSIS — Z85828 Personal history of other malignant neoplasm of skin: Secondary | ICD-10-CM | POA: Diagnosis not present

## 2022-09-20 DIAGNOSIS — L821 Other seborrheic keratosis: Secondary | ICD-10-CM | POA: Diagnosis not present

## 2022-09-20 DIAGNOSIS — L812 Freckles: Secondary | ICD-10-CM | POA: Diagnosis not present

## 2022-09-20 DIAGNOSIS — D2372 Other benign neoplasm of skin of left lower limb, including hip: Secondary | ICD-10-CM | POA: Diagnosis not present

## 2022-09-20 DIAGNOSIS — C44612 Basal cell carcinoma of skin of right upper limb, including shoulder: Secondary | ICD-10-CM | POA: Diagnosis not present

## 2022-09-20 DIAGNOSIS — D485 Neoplasm of uncertain behavior of skin: Secondary | ICD-10-CM | POA: Diagnosis not present

## 2022-09-20 DIAGNOSIS — D1801 Hemangioma of skin and subcutaneous tissue: Secondary | ICD-10-CM | POA: Diagnosis not present

## 2022-10-04 DIAGNOSIS — I700 Atherosclerosis of aorta: Secondary | ICD-10-CM | POA: Diagnosis not present

## 2022-10-04 DIAGNOSIS — E559 Vitamin D deficiency, unspecified: Secondary | ICD-10-CM | POA: Diagnosis not present

## 2022-10-04 DIAGNOSIS — I10 Essential (primary) hypertension: Secondary | ICD-10-CM | POA: Diagnosis not present

## 2022-10-04 DIAGNOSIS — K635 Polyp of colon: Secondary | ICD-10-CM | POA: Diagnosis not present

## 2022-10-04 DIAGNOSIS — N1831 Chronic kidney disease, stage 3a: Secondary | ICD-10-CM | POA: Diagnosis not present

## 2022-10-04 DIAGNOSIS — E039 Hypothyroidism, unspecified: Secondary | ICD-10-CM | POA: Diagnosis not present

## 2022-10-04 DIAGNOSIS — I5189 Other ill-defined heart diseases: Secondary | ICD-10-CM | POA: Diagnosis not present

## 2022-10-04 DIAGNOSIS — I6523 Occlusion and stenosis of bilateral carotid arteries: Secondary | ICD-10-CM | POA: Diagnosis not present

## 2022-10-04 DIAGNOSIS — I2510 Atherosclerotic heart disease of native coronary artery without angina pectoris: Secondary | ICD-10-CM | POA: Diagnosis not present

## 2022-10-04 DIAGNOSIS — I1310 Hypertensive heart and chronic kidney disease without heart failure, with stage 1 through stage 4 chronic kidney disease, or unspecified chronic kidney disease: Secondary | ICD-10-CM | POA: Diagnosis not present

## 2022-10-04 DIAGNOSIS — E785 Hyperlipidemia, unspecified: Secondary | ICD-10-CM | POA: Diagnosis not present

## 2022-10-04 DIAGNOSIS — I679 Cerebrovascular disease, unspecified: Secondary | ICD-10-CM | POA: Diagnosis not present

## 2022-10-18 DIAGNOSIS — Z1231 Encounter for screening mammogram for malignant neoplasm of breast: Secondary | ICD-10-CM | POA: Diagnosis not present

## 2023-01-03 DIAGNOSIS — H00021 Hordeolum internum right upper eyelid: Secondary | ICD-10-CM | POA: Diagnosis not present

## 2023-01-19 DIAGNOSIS — H00021 Hordeolum internum right upper eyelid: Secondary | ICD-10-CM | POA: Diagnosis not present

## 2023-02-01 DIAGNOSIS — N390 Urinary tract infection, site not specified: Secondary | ICD-10-CM | POA: Diagnosis not present

## 2023-02-01 DIAGNOSIS — R300 Dysuria: Secondary | ICD-10-CM | POA: Diagnosis not present

## 2023-02-28 DIAGNOSIS — N390 Urinary tract infection, site not specified: Secondary | ICD-10-CM | POA: Diagnosis not present

## 2023-03-17 DIAGNOSIS — H26491 Other secondary cataract, right eye: Secondary | ICD-10-CM | POA: Diagnosis not present

## 2023-03-17 DIAGNOSIS — H353121 Nonexudative age-related macular degeneration, left eye, early dry stage: Secondary | ICD-10-CM | POA: Diagnosis not present

## 2023-03-17 DIAGNOSIS — H43391 Other vitreous opacities, right eye: Secondary | ICD-10-CM | POA: Diagnosis not present

## 2023-03-17 DIAGNOSIS — Z961 Presence of intraocular lens: Secondary | ICD-10-CM | POA: Diagnosis not present

## 2023-04-04 DIAGNOSIS — E559 Vitamin D deficiency, unspecified: Secondary | ICD-10-CM | POA: Diagnosis not present

## 2023-04-04 DIAGNOSIS — E039 Hypothyroidism, unspecified: Secondary | ICD-10-CM | POA: Diagnosis not present

## 2023-04-04 DIAGNOSIS — E785 Hyperlipidemia, unspecified: Secondary | ICD-10-CM | POA: Diagnosis not present

## 2023-04-04 DIAGNOSIS — N1831 Chronic kidney disease, stage 3a: Secondary | ICD-10-CM | POA: Diagnosis not present

## 2023-04-04 DIAGNOSIS — I129 Hypertensive chronic kidney disease with stage 1 through stage 4 chronic kidney disease, or unspecified chronic kidney disease: Secondary | ICD-10-CM | POA: Diagnosis not present

## 2023-04-11 DIAGNOSIS — Z1331 Encounter for screening for depression: Secondary | ICD-10-CM | POA: Diagnosis not present

## 2023-04-11 DIAGNOSIS — N1831 Chronic kidney disease, stage 3a: Secondary | ICD-10-CM | POA: Diagnosis not present

## 2023-04-11 DIAGNOSIS — I2510 Atherosclerotic heart disease of native coronary artery without angina pectoris: Secondary | ICD-10-CM | POA: Diagnosis not present

## 2023-04-11 DIAGNOSIS — E039 Hypothyroidism, unspecified: Secondary | ICD-10-CM | POA: Diagnosis not present

## 2023-04-11 DIAGNOSIS — Z1339 Encounter for screening examination for other mental health and behavioral disorders: Secondary | ICD-10-CM | POA: Diagnosis not present

## 2023-04-11 DIAGNOSIS — I700 Atherosclerosis of aorta: Secondary | ICD-10-CM | POA: Diagnosis not present

## 2023-04-11 DIAGNOSIS — M8580 Other specified disorders of bone density and structure, unspecified site: Secondary | ICD-10-CM | POA: Diagnosis not present

## 2023-04-11 DIAGNOSIS — Z0000 Encounter for general adult medical examination without abnormal findings: Secondary | ICD-10-CM | POA: Diagnosis not present

## 2023-04-11 DIAGNOSIS — I10 Essential (primary) hypertension: Secondary | ICD-10-CM | POA: Diagnosis not present

## 2023-04-11 DIAGNOSIS — E785 Hyperlipidemia, unspecified: Secondary | ICD-10-CM | POA: Diagnosis not present

## 2023-04-11 DIAGNOSIS — I6523 Occlusion and stenosis of bilateral carotid arteries: Secondary | ICD-10-CM | POA: Diagnosis not present

## 2023-04-11 DIAGNOSIS — R82998 Other abnormal findings in urine: Secondary | ICD-10-CM | POA: Diagnosis not present

## 2023-04-11 DIAGNOSIS — I679 Cerebrovascular disease, unspecified: Secondary | ICD-10-CM | POA: Diagnosis not present

## 2023-04-11 DIAGNOSIS — I129 Hypertensive chronic kidney disease with stage 1 through stage 4 chronic kidney disease, or unspecified chronic kidney disease: Secondary | ICD-10-CM | POA: Diagnosis not present

## 2023-06-06 DIAGNOSIS — Z124 Encounter for screening for malignant neoplasm of cervix: Secondary | ICD-10-CM | POA: Diagnosis not present

## 2023-06-06 DIAGNOSIS — Z6822 Body mass index (BMI) 22.0-22.9, adult: Secondary | ICD-10-CM | POA: Diagnosis not present

## 2023-06-06 DIAGNOSIS — N959 Unspecified menopausal and perimenopausal disorder: Secondary | ICD-10-CM | POA: Diagnosis not present

## 2023-10-17 DIAGNOSIS — I679 Cerebrovascular disease, unspecified: Secondary | ICD-10-CM | POA: Diagnosis not present

## 2023-10-17 DIAGNOSIS — I129 Hypertensive chronic kidney disease with stage 1 through stage 4 chronic kidney disease, or unspecified chronic kidney disease: Secondary | ICD-10-CM | POA: Diagnosis not present

## 2023-10-17 DIAGNOSIS — I7300 Raynaud's syndrome without gangrene: Secondary | ICD-10-CM | POA: Diagnosis not present

## 2023-10-17 DIAGNOSIS — M8580 Other specified disorders of bone density and structure, unspecified site: Secondary | ICD-10-CM | POA: Diagnosis not present

## 2023-10-17 DIAGNOSIS — I700 Atherosclerosis of aorta: Secondary | ICD-10-CM | POA: Diagnosis not present

## 2023-10-17 DIAGNOSIS — E039 Hypothyroidism, unspecified: Secondary | ICD-10-CM | POA: Diagnosis not present

## 2023-10-17 DIAGNOSIS — N1831 Chronic kidney disease, stage 3a: Secondary | ICD-10-CM | POA: Diagnosis not present

## 2023-10-17 DIAGNOSIS — I6523 Occlusion and stenosis of bilateral carotid arteries: Secondary | ICD-10-CM | POA: Diagnosis not present

## 2023-10-17 DIAGNOSIS — E785 Hyperlipidemia, unspecified: Secondary | ICD-10-CM | POA: Diagnosis not present

## 2023-10-17 DIAGNOSIS — K5730 Diverticulosis of large intestine without perforation or abscess without bleeding: Secondary | ICD-10-CM | POA: Diagnosis not present

## 2023-10-17 DIAGNOSIS — I2510 Atherosclerotic heart disease of native coronary artery without angina pectoris: Secondary | ICD-10-CM | POA: Diagnosis not present

## 2023-10-17 DIAGNOSIS — R232 Flushing: Secondary | ICD-10-CM | POA: Diagnosis not present

## 2023-11-24 DIAGNOSIS — R413 Other amnesia: Secondary | ICD-10-CM | POA: Diagnosis not present

## 2023-11-24 DIAGNOSIS — N1831 Chronic kidney disease, stage 3a: Secondary | ICD-10-CM | POA: Diagnosis not present

## 2023-11-24 DIAGNOSIS — F1090 Alcohol use, unspecified, uncomplicated: Secondary | ICD-10-CM | POA: Diagnosis not present

## 2023-11-24 DIAGNOSIS — I129 Hypertensive chronic kidney disease with stage 1 through stage 4 chronic kidney disease, or unspecified chronic kidney disease: Secondary | ICD-10-CM | POA: Diagnosis not present

## 2023-12-13 DIAGNOSIS — R413 Other amnesia: Secondary | ICD-10-CM | POA: Diagnosis not present

## 2023-12-29 DIAGNOSIS — N1831 Chronic kidney disease, stage 3a: Secondary | ICD-10-CM | POA: Diagnosis not present

## 2023-12-29 DIAGNOSIS — I129 Hypertensive chronic kidney disease with stage 1 through stage 4 chronic kidney disease, or unspecified chronic kidney disease: Secondary | ICD-10-CM | POA: Diagnosis not present

## 2023-12-29 DIAGNOSIS — F1090 Alcohol use, unspecified, uncomplicated: Secondary | ICD-10-CM | POA: Diagnosis not present

## 2023-12-29 DIAGNOSIS — R413 Other amnesia: Secondary | ICD-10-CM | POA: Diagnosis not present

## 2024-01-10 DIAGNOSIS — R300 Dysuria: Secondary | ICD-10-CM | POA: Diagnosis not present

## 2024-01-10 DIAGNOSIS — N390 Urinary tract infection, site not specified: Secondary | ICD-10-CM | POA: Diagnosis not present

## 2024-01-23 DIAGNOSIS — I129 Hypertensive chronic kidney disease with stage 1 through stage 4 chronic kidney disease, or unspecified chronic kidney disease: Secondary | ICD-10-CM | POA: Diagnosis not present

## 2024-01-23 DIAGNOSIS — N1831 Chronic kidney disease, stage 3a: Secondary | ICD-10-CM | POA: Diagnosis not present

## 2024-01-23 DIAGNOSIS — R413 Other amnesia: Secondary | ICD-10-CM | POA: Diagnosis not present

## 2024-01-23 DIAGNOSIS — F1090 Alcohol use, unspecified, uncomplicated: Secondary | ICD-10-CM | POA: Diagnosis not present

## 2024-02-08 DIAGNOSIS — N390 Urinary tract infection, site not specified: Secondary | ICD-10-CM | POA: Diagnosis not present

## 2024-02-22 DIAGNOSIS — Z880 Allergy status to penicillin: Secondary | ICD-10-CM | POA: Diagnosis not present

## 2024-02-22 DIAGNOSIS — Z881 Allergy status to other antibiotic agents status: Secondary | ICD-10-CM | POA: Diagnosis not present

## 2024-02-22 DIAGNOSIS — G3184 Mild cognitive impairment, so stated: Secondary | ICD-10-CM | POA: Diagnosis not present

## 2024-02-22 DIAGNOSIS — Z888 Allergy status to other drugs, medicaments and biological substances status: Secondary | ICD-10-CM | POA: Diagnosis not present

## 2024-02-22 DIAGNOSIS — G988 Other disorders of nervous system: Secondary | ICD-10-CM | POA: Diagnosis not present
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
## 2024-03-13 DIAGNOSIS — N760 Acute vaginitis: Secondary | ICD-10-CM | POA: Diagnosis not present

## 2024-04-01 DIAGNOSIS — U071 COVID-19: Secondary | ICD-10-CM | POA: Diagnosis not present

## 2024-04-01 DIAGNOSIS — J029 Acute pharyngitis, unspecified: Secondary | ICD-10-CM | POA: Diagnosis not present

## 2024-04-09 DIAGNOSIS — E039 Hypothyroidism, unspecified: Secondary | ICD-10-CM | POA: Diagnosis not present

## 2024-04-11 ENCOUNTER — Emergency Department (HOSPITAL_BASED_OUTPATIENT_CLINIC_OR_DEPARTMENT_OTHER)
Admission: EM | Admit: 2024-04-11 | Discharge: 2024-04-11 | Disposition: A | Discharge status: Home / Self Care | Attending: Emergency Medicine | Admitting: Emergency Medicine

## 2024-04-11 ENCOUNTER — Other Ambulatory Visit: Payer: Self-pay

## 2024-04-11 ENCOUNTER — Encounter (HOSPITAL_BASED_OUTPATIENT_CLINIC_OR_DEPARTMENT_OTHER): Payer: Self-pay | Admitting: Emergency Medicine

## 2024-04-11 ENCOUNTER — Emergency Department (HOSPITAL_BASED_OUTPATIENT_CLINIC_OR_DEPARTMENT_OTHER): Admitting: Radiology

## 2024-04-11 DIAGNOSIS — R531 Weakness: Secondary | ICD-10-CM

## 2024-04-11 DIAGNOSIS — Z79899 Other long term (current) drug therapy: Secondary | ICD-10-CM | POA: Diagnosis not present

## 2024-04-11 DIAGNOSIS — N3001 Acute cystitis with hematuria: Secondary | ICD-10-CM | POA: Diagnosis not present

## 2024-04-11 DIAGNOSIS — U071 COVID-19: Secondary | ICD-10-CM | POA: Diagnosis not present

## 2024-04-11 DIAGNOSIS — I10 Essential (primary) hypertension: Secondary | ICD-10-CM | POA: Insufficient documentation

## 2024-04-11 DIAGNOSIS — R5383 Other fatigue: Secondary | ICD-10-CM | POA: Diagnosis not present

## 2024-04-11 LAB — URINALYSIS, ROUTINE W REFLEX MICROSCOPIC
Bilirubin Urine: NEGATIVE
Glucose, UA: NEGATIVE mg/dL
Ketones, ur: 15 mg/dL — AB
Nitrite: POSITIVE — AB
Specific Gravity, Urine: 1.024 (ref 1.005–1.030)
WBC, UA: >50 WBC/hpf (ref 0–5)
pH: 6 (ref 5.0–8.0)

## 2024-04-11 LAB — CBC WITH DIFFERENTIAL/PLATELET
Abs Immature Granulocytes: 0.26 K/uL — ABNORMAL HIGH (ref 0.00–0.07)
Basophils Absolute: 0 K/uL (ref 0.0–0.1)
Basophils Relative: 1 %
Eosinophils Absolute: 0.1 K/uL (ref 0.0–0.5)
Eosinophils Relative: 1 %
HCT: 44.6 % (ref 36.0–46.0)
Hemoglobin: 14.8 g/dL (ref 12.0–15.0)
Immature Granulocytes: 3 %
Lymphocytes Relative: 11 %
Lymphs Abs: 1 K/uL (ref 0.7–4.0)
MCH: 31.4 pg (ref 26.0–34.0)
MCHC: 33.2 g/dL (ref 30.0–36.0)
MCV: 94.5 fL (ref 80.0–100.0)
Monocytes Absolute: 1 K/uL (ref 0.1–1.0)
Monocytes Relative: 11 %
Neutro Abs: 6.4 K/uL (ref 1.7–7.7)
Neutrophils Relative %: 73 %
Platelets: 341 K/uL (ref 150–400)
RBC: 4.72 MIL/uL (ref 3.87–5.11)
RDW: 13 % (ref 11.5–15.5)
WBC: 8.7 K/uL (ref 4.0–10.5)
nRBC: 0 % (ref 0.0–0.2)

## 2024-04-11 LAB — COMPREHENSIVE METABOLIC PANEL WITH GFR
ALT: 13 U/L (ref 0–44)
AST: 18 U/L (ref 15–41)
Albumin: 4.2 g/dL (ref 3.5–5.0)
Alkaline Phosphatase: 78 U/L (ref 38–126)
Anion gap: 14 (ref 5–15)
BUN: 13 mg/dL (ref 8–23)
CO2: 26 mmol/L (ref 22–32)
Calcium: 10.1 mg/dL (ref 8.9–10.3)
Chloride: 97 mmol/L — ABNORMAL LOW (ref 98–111)
Creatinine, Ser: 0.9 mg/dL (ref 0.44–1.00)
GFR, Estimated: >60 mL/min (ref >60)
Glucose, Bld: 110 mg/dL — ABNORMAL HIGH (ref 70–99)
Potassium: 3.9 mmol/L (ref 3.5–5.1)
Sodium: 137 mmol/L (ref 135–145)
Total Bilirubin: 0.4 mg/dL (ref 0.0–1.2)
Total Protein: 7.6 g/dL (ref 6.5–8.1)

## 2024-04-11 LAB — TROPONIN T, HIGH SENSITIVITY: Troponin T High Sensitivity: <15 ng/L (ref <19)

## 2024-04-11 LAB — LACTIC ACID, PLASMA: Lactic Acid, Venous: 1.2 mmol/L (ref 0.5–1.9)

## 2024-04-11 MED ORDER — FOSFOMYCIN TROMETHAMINE 3 G PO PACK
3.0000 g | PACK | Freq: Once | ORAL | Status: AC
  Administered 2024-04-11: 3 g via ORAL
  Filled 2024-04-11: qty 3

## 2024-04-11 NOTE — Discharge Instructions (Addendum)
 Your urine today showed signs of infection.  You were treated on today's visit.  Your urine will be sent for improvement in symptoms in 24 to 48 hours you will need to return to the emergency department.

## 2024-04-11 NOTE — ED Provider Notes (Signed)
 [place] EMERGENCY DEPARTMENT AT [facility] Provider Note   CSN: [id number] Arrival date & time: 04/11/24  1248     Patient presents with: Fatigue   Tara Bridges is a 88 y.o. female.   88 year old female with a past medical history of hypertension, thyroid  disease presents to the ED accompanied by daughter-in-law with a chief complaint of weakness.  According to daughter-in-law who is providing most the history patient was diagnosed with COVID-19 on April 01, 2024, she completed a full 5 days of Paxlovid.  Daughter-in-law reports that she continues to be extremely fatigued, has not left her bed, complains of feeling generalized weakness.  She did reach out to her primary care physician who recommended she be evaluated in the emergency department.  Patient is also not eating, she does not have good appetite at baseline.  According to patient she has no complaints except for she is feels extremely fatigued and not been able to get out of bed.  Patient has been fever free for several days, but did arrive to the emergency department with a temperature of nine 99.1.  She denies any chest pain, shortness of breath, abdominal pain, urinary symptoms.  The history is provided by the patient.       Prior to Admission medications   Medication Sig Start Date End Date Taking? Authorizing Provider  acetaminophen  (TYLENOL ) 500 MG tablet Take 1 tablet (500 mg total) by mouth every 6 (six) hours as needed. 10/27/20   Fairy Frames, MD  atorvastatin  (LIPITOR) 10 MG tablet TAKE 1 TABLET BY MOUTH ONCE A WEEK. 11/06/21   Cantwell, Celeste C, PA-C  Cholecalciferol (VITAMIN D -3) 5000 UNITS TABS Take 1 tablet by mouth every other day.    [provider]  doxycycline  (VIBRA -TABS) 100 MG tablet Take 1 tablet (100 mg total) by mouth 2 (two) times daily. 05/07/21   Magdalen Pasco RAMAN, DPM  hydroxypropyl methylcellulose / hypromellose (ISOPTO TEARS / GONIOVISC) 2.5 % ophthalmic solution Place 1 drop  into both eyes daily as needed for dry eyes.    [provider]  ibuprofen (ADVIL) 200 MG tablet Take 200 mg by mouth every 6 (six) hours as needed.    [provider]  levothyroxine  (SYNTHROID ) 88 MCG tablet Take 44-88 mcg by mouth See admin instructions. Take 1 tablet ( ) by mouth on Monday, Wednesday, and Friday. Take 1/2 tablet (44mcg) by mouth on Tuesday, Thursday, Saturday, and Sunday.    [provider]  lisinopril  (ZESTRIL ) 10 MG tablet Take 1 tablet (10 mg total) by mouth daily. 11/27/20   Cantwell, Celeste C, PA-C  MAGNESIUM PO Take 1 tablet by mouth daily.    [provider]  meloxicam  (MOBIC ) 7.5 MG tablet Take 7.5 mg by mouth daily. 11/16/20   [provider]  NON FORMULARY Place 1 spray under the tongue 2 (two) times daily. Health immune spray    [provider]  PREMARIN vaginal cream Place 2 g vaginally once a week. 04/21/15   [provider]  Probiotic Product (PROBIOTIC PO) Take 1 Dose by mouth daily.    [provider]  Psyllium (FIBER) 28.3 % POWD Take by mouth. 1 tablespoon daily    [provider]    Allergies: Cephalexin, Doxycycline , Nitrofurantoin, Penicillins, and Lipitor [atorvastatin ]    Review of Systems  Constitutional:  Positive for fatigue. Negative for chills and fever.  Respiratory:  Negative for shortness of breath.   Cardiovascular:  Negative for chest pain.  Gastrointestinal:  Negative  for abdominal pain, nausea and vomiting.  Neurological:  Positive for weakness.  All other systems reviewed and are negative.   Updated Vital Signs BP (!) 176/84   Pulse 69   Temp 99.1 F (37.3 C) (Oral)   Resp 18   SpO2 96%   Physical Exam Vitals and nursing note reviewed.  Constitutional:      Appearance: Normal appearance.  HENT:     Head: Normocephalic and atraumatic.     Mouth/Throat:     Mouth: Mucous membranes are moist.  Eyes:     Pupils: Pupils are equal, round, and  reactive to light.  Cardiovascular:     Rate and Rhythm: Normal rate.     Comments: No bilateral pitting edema. Pulmonary:     Effort: Pulmonary effort is normal.     Comments: Lungs are clear to auscultation no wheezing, rhonchi or rales. Abdominal:     General: Abdomen is flat.     Comments: Abdomen is soft, nontender to palpation.  No CVA tenderness bilaterally.  Musculoskeletal:     Cervical back: Normal range of motion and neck supple.  Neurological:     Mental Status: She is alert and oriented to person, place, and time.     (all labs ordered are listed, but only abnormal results are displayed) Labs Reviewed  CBC WITH DIFFERENTIAL/PLATELET - Abnormal; Notable for the following components:      Result Value   Abs Immature Granulocytes 0.26 (*)    All other components within normal limits  COMPREHENSIVE METABOLIC PANEL WITH GFR - Abnormal; Notable for the following components:   Chloride 97 (*)    Glucose, Bld 110 (*)    All other components within normal limits  URINALYSIS, ROUTINE W REFLEX MICROSCOPIC - Abnormal; Notable for the following components:   Hgb urine dipstick SMALL (*)    Ketones, ur 15 (*)    Protein, ur TRACE (*)    Nitrite POSITIVE (*)    Leukocytes,Ua LARGE (*)    Bacteria, UA MANY (*)    All other components within normal limits  URINE CULTURE  LACTIC ACID, PLASMA  TROPONIN T, HIGH SENSITIVITY  TROPONIN T, HIGH SENSITIVITY    EKG: None  Radiology: DG Chest 2 View Result Date: 04/11/2024 CLINICAL DATA:  COVID positive.  Fatigue. EXAM: CHEST - 2 VIEW COMPARISON:  October 24, 2020. FINDINGS: The heart size and mediastinal contours are within normal limits. Both lungs are clear. The visualized skeletal structures are unremarkable. IMPRESSION: No active cardiopulmonary disease. Electronically Signed   By: Lynwood Landy Raddle M.D.   On: 04/11/2024 14:29     Procedures   Medications Ordered in the ED  fosfomycin (MONUROL ) packet 3 g (3 g Oral Given  04/11/24 1547)    Clinical Course as of 04/11/24 1550  Wed Apr 11, 2024  1539 Nitrite(!): POSITIVE [JS]  1539 Leukocytes,Ua(!): LARGE [JS]  1539 Bacteria, UA(!): MANY [JS]  1544 Hgb urine dipstick(!): SMALL [JS]    Clinical Course User Index [JS] Rhyder Koegel, PA-C                                 Medical Decision Making Amount and/or Complexity of Data Reviewed Labs: ordered. Decision-making details documented in ED Course. Radiology: ordered.  Risk Prescription drug management.   This patient presents to the ED for concern of weakness, this involves a number of treatment options, and is a complaint that carries  with it a high risk of complications and morbidity.  The differential diagnosis includes post covid 19 pneumonia, further infection versus electrolyte imbalance/    Co morbidities: Discussed in HPI   Brief History:  SEE HPI.  EMR reviewed including pt PMHx, past surgical history and past visits to ER.   See HPI for more details   Lab Tests:  I ordered and independently interpreted labs.  The pertinent results include:    I personally reviewed all laboratory work and imaging. Metabolic panel without any acute abnormality specifically kidney function within normal limits and no significant electrolyte abnormalities. CBC without leukocytosis or significant anemia.  Lactic acid is negative.  Urinalysis with positive nitrites, large leukocytes, many bacteria and greater than 50 white blood cell count, urine culture has been sent off.  Imaging Studies:  NAD. I personally reviewed all imaging studies and no acute abnormality found. I agree with radiology interpretation.  Cardiac Monitoring:  The patient was maintained on a cardiac monitor.  I personally viewed and interpreted the cardiac monitored which showed an underlying rhythm of: NSR 66 EKG non-ischemic   Medicines ordered:  N.A  Reevaluation:  After the interventions noted above I re-evaluated  patient and found that they have :stayed the same  Social Determinants of Health:  The patient's social determinants of health were a factor in the care of this patient  Problem List / ED Course:  Patient presenting to the ED with a few days of weakness, fatigue, decrease in oral intake.  Brought in by daughter-in-law who reports patient was diagnosed with COVID-19 on April 01, 2024.  She completed an entire treatment of Paxlovid, according to daughter-in-law she has not felt better.  She is not reporting any shortness of breath or chest pain.  Blood work is unremarkable with a CBC with no leukocytosis, CMP is normal.  Creatinine level is normal.  LFTs are within normal limits.  Chest x-ray without any signs of pneumonia.  UA with some large leukocytes, many bacteria, positive nitrites.  She is not having urinary symptoms at this time and according to daughter-in-law she usually has urinary symptoms.  She has not had a fever in about 4 days since her COVID-19 infection.  She does have a low-grade temp of 99.1 upon arrival to the ED.  UA concerning for UTI with blood present.  Opted for treatment with 1 dose of fosfomycin 3 g.  She does not have any CVA tenderness, no nausea, no vomiting, no pains of sepsis to suggest urosepsis at this time.  We discussed close follow-up with PCP, will also send urine culture in order to further evaluate pathogen.  Daughter-in-law along with patient agreeable of plan and treatment.  Hemodynamically stable for discharge.  Dispostion:  After consideration of the diagnostic results and the patients response to treatment, I feel that the patent would benefit from treatment of urinary tract infection.  Close follow-up with primary care physician.   Portions of this note were generated with Scientist, clinical (histocompatibility and immunogenetics). Dictation errors may occur despite best attempts at proofreading.   Final diagnoses:  Weakness  Acute cystitis with hematuria    ED Discharge Orders      None          Maureen Broad, PA-C 04/11/24 1550    Dreama Longs, MD 04/11/24 2240

## 2024-04-11 NOTE — ED Triage Notes (Signed)
+   covid on 7/27 Took paxlovid  Fatigue and weakness worsened  Not eating much

## 2024-04-13 LAB — URINE CULTURE: Culture: >=100000 — AB

## 2024-04-14 ENCOUNTER — Telehealth (HOSPITAL_BASED_OUTPATIENT_CLINIC_OR_DEPARTMENT_OTHER): Payer: Self-pay | Admitting: *Deleted

## 2024-04-14 NOTE — Telephone Encounter (Signed)
 Post ED Visit - Positive Culture Follow-up  Culture report reviewed by antimicrobial stewardship pharmacist: [facility] []  Rankin Dee, Pharm.D. []  Venetia Gully, Pharm.D., BCPS AQ-ID []  Garrel Crews, Pharm.D., BCPS []  Almarie Lunger, [address].D., BCPS []  [place], [address].D., BCPS, AAHIVP []  Rosaline Bihari, Pharm.D., BCPS, AAHIVP []  Vernell Meier, PharmD, BCPS []  Latanya Hint, PharmD, BCPS []  Donald Medley, PharmD, BCPS []  Rocky Bold, PharmD []  Dorothyann Alert, PharmD, BCPS [x]  Dorn Poot, PharmD  [facility] []  Rosaline Edison, PharmD []  Romona Bliss, PharmD []  Dolphus Roller, PharmD []  Veva Seip, Rph []  Vernell Daunt) Leonce, PharmD []  Eva Allis, PharmD []  Rosaline Millet, PharmD []  Iantha Batch, PharmD []  Arvin Gauss, PharmD []  Wanda Hasting, PharmD []  Ronal Rav, PharmD []  Rocky Slade, PharmD []  Bard Jeans, PharmD   Positive urine culture Treated with Fosfomycin, organism sensitive to the same and no further patient follow-up is required at this time.  Albino Alan Novak 04/14/2024, 10:06 AM

## 2024-04-20 DIAGNOSIS — E785 Hyperlipidemia, unspecified: Secondary | ICD-10-CM | POA: Diagnosis not present

## 2024-04-20 DIAGNOSIS — E039 Hypothyroidism, unspecified: Secondary | ICD-10-CM | POA: Diagnosis not present

## 2024-04-20 DIAGNOSIS — E559 Vitamin D deficiency, unspecified: Secondary | ICD-10-CM | POA: Diagnosis not present

## 2024-04-20 DIAGNOSIS — I129 Hypertensive chronic kidney disease with stage 1 through stage 4 chronic kidney disease, or unspecified chronic kidney disease: Secondary | ICD-10-CM | POA: Diagnosis not present

## 2024-04-20 DIAGNOSIS — N1831 Chronic kidney disease, stage 3a: Secondary | ICD-10-CM | POA: Diagnosis not present

## 2024-04-25 DIAGNOSIS — N390 Urinary tract infection, site not specified: Secondary | ICD-10-CM | POA: Diagnosis not present

## 2024-04-25 DIAGNOSIS — I129 Hypertensive chronic kidney disease with stage 1 through stage 4 chronic kidney disease, or unspecified chronic kidney disease: Secondary | ICD-10-CM | POA: Diagnosis not present

## 2024-04-25 DIAGNOSIS — N1831 Chronic kidney disease, stage 3a: Secondary | ICD-10-CM | POA: Diagnosis not present

## 2024-04-27 DIAGNOSIS — N390 Urinary tract infection, site not specified: Secondary | ICD-10-CM | POA: Diagnosis not present

## 2024-04-27 DIAGNOSIS — I5189 Other ill-defined heart diseases: Secondary | ICD-10-CM | POA: Diagnosis not present

## 2024-04-27 DIAGNOSIS — I7300 Raynaud's syndrome without gangrene: Secondary | ICD-10-CM | POA: Diagnosis not present

## 2024-04-27 DIAGNOSIS — Z1389 Encounter for screening for other disorder: Secondary | ICD-10-CM | POA: Diagnosis not present

## 2024-04-27 DIAGNOSIS — N1831 Chronic kidney disease, stage 3a: Secondary | ICD-10-CM | POA: Diagnosis not present

## 2024-04-27 DIAGNOSIS — M8580 Other specified disorders of bone density and structure, unspecified site: Secondary | ICD-10-CM | POA: Diagnosis not present

## 2024-04-27 DIAGNOSIS — E785 Hyperlipidemia, unspecified: Secondary | ICD-10-CM | POA: Diagnosis not present

## 2024-04-27 DIAGNOSIS — I679 Cerebrovascular disease, unspecified: Secondary | ICD-10-CM | POA: Diagnosis not present

## 2024-04-27 DIAGNOSIS — Z0000 Encounter for general adult medical examination without abnormal findings: Secondary | ICD-10-CM | POA: Diagnosis not present

## 2024-04-27 DIAGNOSIS — I129 Hypertensive chronic kidney disease with stage 1 through stage 4 chronic kidney disease, or unspecified chronic kidney disease: Secondary | ICD-10-CM | POA: Diagnosis not present

## 2024-04-27 DIAGNOSIS — R413 Other amnesia: Secondary | ICD-10-CM | POA: Diagnosis not present

## 2024-04-27 DIAGNOSIS — E039 Hypothyroidism, unspecified: Secondary | ICD-10-CM | POA: Diagnosis not present

## 2024-04-27 DIAGNOSIS — I10 Essential (primary) hypertension: Secondary | ICD-10-CM | POA: Diagnosis not present

## 2024-04-27 DIAGNOSIS — F1090 Alcohol use, unspecified, uncomplicated: Secondary | ICD-10-CM | POA: Diagnosis not present

## 2024-04-27 DIAGNOSIS — Z1331 Encounter for screening for depression: Secondary | ICD-10-CM | POA: Diagnosis not present

## 2024-07-03 DIAGNOSIS — G3184 Mild cognitive impairment, so stated: Secondary | ICD-10-CM | POA: Diagnosis not present

## 2024-07-03 DIAGNOSIS — I6782 Cerebral ischemia: Secondary | ICD-10-CM | POA: Diagnosis not present

## 2024-07-11 DIAGNOSIS — G3184 Mild cognitive impairment, so stated: Secondary | ICD-10-CM | POA: Diagnosis not present

## 2024-07-11 DIAGNOSIS — I679 Cerebrovascular disease, unspecified: Secondary | ICD-10-CM | POA: Diagnosis not present

## 2024-07-11 DIAGNOSIS — Z8673 Personal history of transient ischemic attack (TIA), and cerebral infarction without residual deficits: Secondary | ICD-10-CM | POA: Diagnosis not present

## 2024-07-11 DIAGNOSIS — R251 Tremor, unspecified: Secondary | ICD-10-CM | POA: Diagnosis not present
